# Patient Record
Sex: Male | Born: 1935 | Race: White | Hispanic: No | Marital: Married | Smoking: Former smoker
Health system: Southern US, Community
[De-identification: ages and names within clinical notes are randomized; demographics above are authoritative.]

## PROBLEM LIST (undated history)

## (undated) DIAGNOSIS — U071 COVID-19: Secondary | ICD-10-CM

## (undated) DIAGNOSIS — D62 Acute posthemorrhagic anemia: Secondary | ICD-10-CM

## (undated) DIAGNOSIS — I509 Heart failure, unspecified: Secondary | ICD-10-CM

## (undated) DIAGNOSIS — I350 Nonrheumatic aortic (valve) stenosis: Secondary | ICD-10-CM

## (undated) DIAGNOSIS — C801 Malignant (primary) neoplasm, unspecified: Secondary | ICD-10-CM

## (undated) DIAGNOSIS — C4490 Unspecified malignant neoplasm of skin, unspecified: Secondary | ICD-10-CM

## (undated) DIAGNOSIS — J942 Hemothorax: Secondary | ICD-10-CM

## (undated) DIAGNOSIS — E7800 Pure hypercholesterolemia, unspecified: Secondary | ICD-10-CM

## (undated) DIAGNOSIS — I10 Essential (primary) hypertension: Secondary | ICD-10-CM

## (undated) DIAGNOSIS — I209 Angina pectoris, unspecified: Secondary | ICD-10-CM

## (undated) DIAGNOSIS — Z952 Presence of prosthetic heart valve: Secondary | ICD-10-CM

## (undated) DIAGNOSIS — C61 Malignant neoplasm of prostate: Secondary | ICD-10-CM

## (undated) DIAGNOSIS — Z951 Presence of aortocoronary bypass graft: Secondary | ICD-10-CM

## (undated) DIAGNOSIS — Z9889 Other specified postprocedural states: Secondary | ICD-10-CM

## (undated) HISTORY — DX: Acute posthemorrhagic anemia: D62

## (undated) HISTORY — DX: Malignant (primary) neoplasm, unspecified: C80.1

## (undated) HISTORY — DX: Essential (primary) hypertension: I10

## (undated) HISTORY — DX: Unspecified malignant neoplasm of skin, unspecified: C44.90

## (undated) HISTORY — PX: CORONARY ARTERY BYPASS GRAFT: SHX141

## (undated) HISTORY — DX: Heart failure, unspecified: I50.9

## (undated) HISTORY — DX: Malignant neoplasm of prostate: C61

## (undated) HISTORY — PX: HERNIA REPAIR: SHX51

## (undated) HISTORY — DX: Other specified postprocedural states: Z98.890

## (undated) HISTORY — PX: AORTIC VALVE REPLACEMENT: SHX41

## (undated) HISTORY — DX: Hemothorax: J94.2

## (undated) HISTORY — DX: Presence of prosthetic heart valve: Z95.2

## (undated) HISTORY — DX: Nonrheumatic aortic (valve) stenosis: I35.0

## (undated) HISTORY — DX: Presence of aortocoronary bypass graft: Z95.1

## (undated) HISTORY — DX: Pure hypercholesterolemia, unspecified: E78.00

## (undated) HISTORY — DX: Angina pectoris, unspecified: I20.9

## (undated) HISTORY — PX: CARDIAC SURGERY: SHX584

---

## 1999-08-09 HISTORY — PX: PROSTATECTOMY: SHX69

## 2004-05-27 ENCOUNTER — Inpatient Hospital Stay: Payer: Self-pay | Admitting: Cardiology

## 2004-05-27 ENCOUNTER — Other Ambulatory Visit: Payer: Self-pay

## 2004-09-20 ENCOUNTER — Emergency Department: Payer: Self-pay | Admitting: Emergency Medicine

## 2004-09-29 ENCOUNTER — Emergency Department: Payer: Self-pay | Admitting: Emergency Medicine

## 2005-11-17 ENCOUNTER — Ambulatory Visit: Payer: Self-pay | Admitting: Otolaryngology

## 2007-06-25 ENCOUNTER — Ambulatory Visit: Payer: Self-pay | Admitting: Urology

## 2007-07-19 ENCOUNTER — Emergency Department: Payer: Self-pay | Admitting: Emergency Medicine

## 2007-08-13 ENCOUNTER — Ambulatory Visit: Payer: Self-pay | Admitting: Urology

## 2007-09-13 ENCOUNTER — Ambulatory Visit: Payer: Self-pay | Admitting: Otolaryngology

## 2009-07-15 ENCOUNTER — Ambulatory Visit: Payer: Self-pay | Admitting: Internal Medicine

## 2010-07-12 ENCOUNTER — Ambulatory Visit: Payer: Self-pay | Admitting: Internal Medicine

## 2010-08-05 ENCOUNTER — Ambulatory Visit: Payer: Self-pay | Admitting: Physician Assistant

## 2010-08-06 ENCOUNTER — Ambulatory Visit: Payer: Self-pay | Admitting: Internal Medicine

## 2010-08-13 ENCOUNTER — Ambulatory Visit: Payer: Self-pay | Admitting: Internal Medicine

## 2010-08-30 ENCOUNTER — Ambulatory Visit: Payer: Self-pay | Admitting: Surgery

## 2010-09-06 ENCOUNTER — Ambulatory Visit: Payer: Self-pay | Admitting: Surgery

## 2010-09-07 LAB — PATHOLOGY REPORT

## 2011-07-05 ENCOUNTER — Ambulatory Visit: Payer: Self-pay | Admitting: Internal Medicine

## 2013-01-10 ENCOUNTER — Ambulatory Visit: Payer: Self-pay | Admitting: Cardiology

## 2013-01-17 DIAGNOSIS — I2510 Atherosclerotic heart disease of native coronary artery without angina pectoris: Secondary | ICD-10-CM | POA: Insufficient documentation

## 2013-01-17 DIAGNOSIS — I350 Nonrheumatic aortic (valve) stenosis: Secondary | ICD-10-CM

## 2013-01-17 HISTORY — DX: Nonrheumatic aortic (valve) stenosis: I35.0

## 2013-01-22 DIAGNOSIS — I209 Angina pectoris, unspecified: Secondary | ICD-10-CM | POA: Insufficient documentation

## 2013-01-22 DIAGNOSIS — I509 Heart failure, unspecified: Secondary | ICD-10-CM

## 2013-01-22 HISTORY — DX: Heart failure, unspecified: I50.9

## 2013-01-22 HISTORY — DX: Angina pectoris, unspecified: I20.9

## 2013-01-25 DIAGNOSIS — D62 Acute posthemorrhagic anemia: Secondary | ICD-10-CM

## 2013-01-25 DIAGNOSIS — Z951 Presence of aortocoronary bypass graft: Secondary | ICD-10-CM | POA: Insufficient documentation

## 2013-01-25 DIAGNOSIS — J942 Hemothorax: Secondary | ICD-10-CM

## 2013-01-25 DIAGNOSIS — Z9889 Other specified postprocedural states: Secondary | ICD-10-CM

## 2013-01-25 DIAGNOSIS — J90 Pleural effusion, not elsewhere classified: Secondary | ICD-10-CM

## 2013-01-25 DIAGNOSIS — G8918 Other acute postprocedural pain: Secondary | ICD-10-CM | POA: Insufficient documentation

## 2013-01-25 DIAGNOSIS — Z952 Presence of prosthetic heart valve: Secondary | ICD-10-CM | POA: Insufficient documentation

## 2013-01-25 HISTORY — DX: Presence of aortocoronary bypass graft: Z95.1

## 2013-01-25 HISTORY — DX: Other specified postprocedural states: Z98.890

## 2013-01-25 HISTORY — DX: Acute posthemorrhagic anemia: D62

## 2013-01-25 HISTORY — DX: Pleural effusion, not elsewhere classified: J90

## 2013-01-25 HISTORY — DX: Presence of prosthetic heart valve: Z95.2

## 2013-01-25 HISTORY — DX: Hemothorax: J94.2

## 2013-03-07 ENCOUNTER — Encounter: Payer: Self-pay | Admitting: Cardiology

## 2013-03-08 ENCOUNTER — Encounter: Payer: Self-pay | Admitting: Cardiology

## 2013-03-15 ENCOUNTER — Ambulatory Visit: Payer: Self-pay | Admitting: Internal Medicine

## 2013-04-08 ENCOUNTER — Encounter: Payer: Self-pay | Admitting: Cardiology

## 2014-06-02 DIAGNOSIS — I10 Essential (primary) hypertension: Secondary | ICD-10-CM | POA: Insufficient documentation

## 2014-06-02 HISTORY — DX: Essential (primary) hypertension: I10

## 2014-10-15 DIAGNOSIS — E7800 Pure hypercholesterolemia, unspecified: Secondary | ICD-10-CM

## 2014-10-15 HISTORY — DX: Pure hypercholesterolemia, unspecified: E78.00

## 2015-04-28 DIAGNOSIS — C801 Malignant (primary) neoplasm, unspecified: Secondary | ICD-10-CM

## 2015-04-28 HISTORY — DX: Malignant (primary) neoplasm, unspecified: C80.1

## 2015-09-14 DIAGNOSIS — Z0389 Encounter for observation for other suspected diseases and conditions ruled out: Secondary | ICD-10-CM | POA: Diagnosis not present

## 2015-09-16 ENCOUNTER — Emergency Department: Payer: PPO

## 2015-09-16 ENCOUNTER — Emergency Department
Admission: EM | Admit: 2015-09-16 | Discharge: 2015-09-16 | Disposition: A | Discharge status: Home / Self Care | Payer: PPO | Attending: Emergency Medicine | Admitting: Emergency Medicine

## 2015-09-16 ENCOUNTER — Encounter: Payer: Self-pay | Admitting: *Deleted

## 2015-09-16 DIAGNOSIS — N201 Calculus of ureter: Secondary | ICD-10-CM | POA: Insufficient documentation

## 2015-09-16 DIAGNOSIS — N132 Hydronephrosis with renal and ureteral calculous obstruction: Secondary | ICD-10-CM | POA: Diagnosis not present

## 2015-09-16 DIAGNOSIS — R109 Unspecified abdominal pain: Secondary | ICD-10-CM | POA: Diagnosis not present

## 2015-09-16 DIAGNOSIS — R1031 Right lower quadrant pain: Secondary | ICD-10-CM | POA: Diagnosis not present

## 2015-09-16 LAB — CBC
HEMATOCRIT: 48.1 % (ref 40.0–52.0)
HEMOGLOBIN: 16.1 g/dL (ref 13.0–18.0)
MCH: 31.4 pg (ref 26.0–34.0)
MCHC: 33.5 g/dL (ref 32.0–36.0)
MCV: 93.8 fL (ref 80.0–100.0)
PLATELETS: 163 10*3/uL (ref 150–440)
RBC: 5.13 MIL/uL (ref 4.40–5.90)
RDW: 13.6 % (ref 11.5–14.5)
WBC: 10.1 10*3/uL (ref 3.8–10.6)

## 2015-09-16 LAB — URINALYSIS COMPLETE WITH MICROSCOPIC (ARMC ONLY)
BILIRUBIN URINE: NEGATIVE
Bacteria, UA: NONE SEEN
GLUCOSE, UA: NEGATIVE mg/dL
HGB URINE DIPSTICK: NEGATIVE
LEUKOCYTES UA: NEGATIVE
Nitrite: NEGATIVE
PH: 5 (ref 5.0–8.0)
PROTEIN: 100 mg/dL — AB
SQUAMOUS EPITHELIAL / LPF: NONE SEEN
Specific Gravity, Urine: 1.025 (ref 1.005–1.030)

## 2015-09-16 LAB — BASIC METABOLIC PANEL
ANION GAP: 10 (ref 5–15)
BUN: 28 mg/dL — ABNORMAL HIGH (ref 6–20)
CO2: 23 mmol/L (ref 22–32)
CREATININE: 1.22 mg/dL (ref 0.61–1.24)
Calcium: 10.1 mg/dL (ref 8.9–10.3)
Chloride: 105 mmol/L (ref 101–111)
GFR calc non Af Amer: 54 mL/min — ABNORMAL LOW (ref >60)
Glucose, Bld: 129 mg/dL — ABNORMAL HIGH (ref 65–99)
POTASSIUM: 4.6 mmol/L (ref 3.5–5.1)
SODIUM: 138 mmol/L (ref 135–145)

## 2015-09-16 MED ORDER — ONDANSETRON 4 MG PO TBDP: 4.0000 mg | ORAL_TABLET | Freq: Three times a day (TID) | ORAL | Status: DC | PRN

## 2015-09-16 MED ORDER — OXYCODONE-ACETAMINOPHEN 5-325 MG PO TABS: 1.0000 | ORAL_TABLET | Freq: Four times a day (QID) | ORAL | Status: DC | PRN

## 2015-09-16 MED ORDER — MORPHINE SULFATE (PF) 4 MG/ML IV SOLN
4.0000 mg | Freq: Once | INTRAVENOUS | Status: AC
  Administered 2015-09-16: 4 mg via INTRAVENOUS
  Filled 2015-09-16: qty 1

## 2015-09-16 MED ORDER — ONDANSETRON HCL 4 MG/2ML IJ SOLN
4.0000 mg | Freq: Once | INTRAMUSCULAR | Status: AC
  Administered 2015-09-16: 4 mg via INTRAVENOUS
  Filled 2015-09-16: qty 2

## 2015-09-16 MED ORDER — TAMSULOSIN HCL 0.4 MG PO CAPS: 0.4000 mg | ORAL_CAPSULE | Freq: Every day | ORAL | Status: DC

## 2015-09-16 MED ORDER — OXYCODONE-ACETAMINOPHEN 5-325 MG PO TABS
2.0000 | ORAL_TABLET | Freq: Once | ORAL | Status: AC
  Administered 2015-09-16: 2 via ORAL
  Filled 2015-09-16: qty 2

## 2015-09-16 MED ORDER — SODIUM CHLORIDE 0.9 % IV BOLUS (SEPSIS)
1000.0000 mL | Freq: Once | INTRAVENOUS | Status: AC
  Administered 2015-09-16: 1000 mL via INTRAVENOUS

## 2015-09-16 NOTE — ED Provider Notes (Signed)
[facility] Emergency Department Provider Note  Time seen: 2:44 PM  I have reviewed the triage vital signs and the nursing notes.   HISTORY  Chief Complaint Flank Pain    HPI Bradley Nelson is a 82 y.o. male with a past medical history of kidney stones, presents to the emergency department with right flank pain. According to the patient beginning at 10 AM this morning he began experiencing sharp significant right flank pain which started in his back and has now radiated to the front of his abdomen. States it feels similar to prior kidney stones. Patient has had 2 kidney stones previously. Denies any dysuria or hematuria. Denies any diarrhea, black or bloody stool. Some nausea but denies vomiting. Describes his pain as a 7/10, sharp pain in the right lower quadrant. Patient states he is status post appendectomy.     Past Medical History  Diagnosis Date  . Cancer ([place])     There are no active problems to display for this patient.   Past Surgical History  Procedure Laterality Date  . Coronary artery bypass graft    . Aortic valve replacement      No current outpatient prescriptions on file.  Allergies Review of patient's allergies indicates no known allergies.  History reviewed. No pertinent family history.  Social History Social History  Substance Use Topics  . Smoking status: Unknown If Ever Smoked  . Smokeless tobacco: None  . Alcohol Use: None    Review of Systems Constitutional: Negative for fever. Cardiovascular: Negative for chest pain. Respiratory: Negative for shortness of breath. Gastrointestinal: Right flank pain. Positive nausea. Negative for vomiting or diarrhea. Genitourinary: Negative for dysuria. Negative hematuria. Musculoskeletal: Right back pain. Neurological: Negative for headache 10-point ROS otherwise negative.  ____________________________________________   PHYSICAL EXAM:  VITAL SIGNS: ED Triage Vitals   Enc Vitals Group     BP 09/16/15 1302 217/107 mmHg     Pulse Rate 09/16/15 1302 53     Resp 09/16/15 1302 18     Temp 09/16/15 1302 97.5 F (36.4 C)     Temp Source 09/16/15 1302 Oral     SpO2 09/16/15 1302 98 %     Weight 09/16/15 1302 165 lb (74.844 kg)     Height 09/16/15 1302 5\' 7"  (1.702 m)     Head Cir --      Peak Flow --      Pain Score --      Pain Loc --      Pain Edu? --      Excl. in [place]? --     Constitutional: Alert and oriented. Well appearing and in no distress. Eyes: Normal exam ENT   Head: Normocephalic and atraumatic.   Mouth/Throat: Mucous membranes are moist. Cardiovascular: Normal rate, regular rhythm. No murmur Respiratory: Normal respiratory effort without tachypnea nor retractions. Breath sounds are clear Gastrointestinal: Soft, mild right lower quadrant tenderness palpation. No rebound or guarding. No distention. Musculoskeletal: Nontender with normal range of motion in all extremities. Neurologic:  Normal speech and language. No gross focal neurologic deficits  Skin:  Skin is warm, dry and intact.  Psychiatric: Mood and affect are normal. Speech and behavior are normal.   ____________________________________________    RADIOLOGY  CT shows distal right ureteral stone.  ____________________________________________   INITIAL IMPRESSION / ASSESSMENT AND PLAN / ED COURSE  Pertinent labs & imaging results that were available during my care of the patient were reviewed by me and considered in my  medical decision making (see chart for details).  Patient presents with right flank pain which she states is consistent with prior kidney stones. Patient states his last kidney stone was brought 20 one year ago. Denies any dysuria or hematuria. Describes the pain as moderate 7/10 sharp pain. Labs are largely within normal limits. Urinalysis is negative. We will obtain a CT scan to rule out ureteral lithiasis.  ED consistent with 3 mm distal ureteral  stone. Labs otherwise within normal limits. We'll discharge on Percocet, Flomax, with urology follow-up if needed. Patient agreeable.  ____________________________________________   FINAL CLINICAL IMPRESSION(S) / ED DIAGNOSES  Right flank pain Ureterolithiasis    Harvest Dark, MD 09/16/15 [phone]

## 2015-09-16 NOTE — Discharge Instructions (Signed)
Kidney Stones °Kidney stones (urolithiasis) are deposits that form inside your kidneys. The intense pain is caused by the stone moving through the urinary tract. When the stone moves, the ureter goes into spasm around the stone. The stone is usually passed in the urine.  °CAUSES  °· A disorder that makes certain neck glands produce too much parathyroid hormone (primary hyperparathyroidism). °· A buildup of uric acid crystals, similar to gout in your joints. °· Narrowing (stricture) of the ureter. °· A kidney obstruction present at birth (congenital obstruction). °· Previous surgery on the kidney or ureters. °· Numerous kidney infections. °SYMPTOMS  °· Feeling sick to your stomach (nauseous). °· Throwing up (vomiting). °· Blood in the urine (hematuria). °· Pain that usually spreads (radiates) to the groin. °· Frequency or urgency of urination. °DIAGNOSIS  °· Taking a history and physical exam. °· Blood or urine tests. °· CT scan. °· Occasionally, an examination of the inside of the urinary bladder (cystoscopy) is performed. °TREATMENT  °· Observation. °· Increasing your fluid intake. °· Extracorporeal shock wave lithotripsy--This is a noninvasive procedure that uses shock waves to break up kidney stones. °· Surgery may be needed if you have severe pain or persistent obstruction. There are various surgical procedures. Most of the procedures are performed with the use of small instruments. Only small incisions are needed to accommodate these instruments, so recovery time is minimized. °The size, location, and chemical composition are all important variables that will determine the proper choice of action for you. Talk to your health care provider to better understand your situation so that you will minimize the risk of injury to yourself and your kidney.  °HOME CARE INSTRUCTIONS  °· Drink enough water and fluids to keep your urine clear or pale yellow. This will help you to pass the stone or stone fragments. °· Strain  all urine through the provided strainer. Keep all particulate matter and stones for your health care provider to see. The stone causing the pain may be as small as a grain of salt. It is very important to use the strainer each and every time you pass your urine. The collection of your stone will allow your health care provider to analyze it and verify that a stone has actually passed. The stone analysis will often identify what you can do to reduce the incidence of recurrences. °· Only take over-the-counter or prescription medicines for pain, discomfort, or fever as directed by your health care provider. °· Keep all follow-up visits as told by your health care provider. This is important. °· Get follow-up X-rays if required. The absence of pain does not always mean that the stone has passed. It may have only stopped moving. If the urine remains completely obstructed, it can cause loss of kidney function or even complete destruction of the kidney. It is your responsibility to make sure X-rays and follow-ups are completed. Ultrasounds of the kidney can show blockages and the status of the kidney. Ultrasounds are not associated with any radiation and can be performed easily in a matter of minutes. °· Make changes to your daily diet as told by your health care provider. You may be told to: °¨ Limit the amount of salt that you eat. °¨ Eat 5 or more servings of fruits and vegetables each day. °¨ Limit the amount of meat, poultry, fish, and eggs that you eat. °· Collect a 24-hour urine sample as told by your health care provider. You may need to collect another urine sample every 6-12   months. °SEEK MEDICAL CARE IF: °· You experience pain that is progressive and unresponsive to any pain medicine you have been prescribed. °SEEK IMMEDIATE MEDICAL CARE IF:  °· Pain cannot be controlled with the prescribed medicine. °· You have a fever or shaking chills. °· The severity or intensity of pain increases over 18 hours and is not  relieved by pain medicine. °· You develop a new onset of abdominal pain. °· You feel faint or pass out. °· You are unable to urinate. °  °This information is not intended to replace advice given to you by your health care provider. Make sure you discuss any questions you have with your health care provider. °  °Document Released: 07/25/2005 Document Revised: 04/15/2015 Document Reviewed: 12/26/2012 °Elsevier Interactive Patient Education ©2016 Else[employer]. ° °

## 2015-09-16 NOTE — ED Notes (Signed)
States right flank pain that began this AM, denies any blood in urine, states hx of kidney stones

## 2015-10-22 DIAGNOSIS — D696 Thrombocytopenia, unspecified: Secondary | ICD-10-CM | POA: Diagnosis not present

## 2015-10-27 DIAGNOSIS — D696 Thrombocytopenia, unspecified: Secondary | ICD-10-CM | POA: Diagnosis not present

## 2015-10-27 DIAGNOSIS — E7800 Pure hypercholesterolemia, unspecified: Secondary | ICD-10-CM | POA: Diagnosis not present

## 2015-10-27 DIAGNOSIS — I2510 Atherosclerotic heart disease of native coronary artery without angina pectoris: Secondary | ICD-10-CM | POA: Diagnosis not present

## 2015-10-27 DIAGNOSIS — I10 Essential (primary) hypertension: Secondary | ICD-10-CM | POA: Diagnosis not present

## 2016-01-15 ENCOUNTER — Emergency Department
Admission: EM | Admit: 2016-01-15 | Discharge: 2016-01-16 | Disposition: A | Discharge status: Home / Self Care | Payer: PPO | Attending: Emergency Medicine | Admitting: Emergency Medicine

## 2016-01-15 ENCOUNTER — Encounter: Payer: Self-pay | Admitting: *Deleted

## 2016-01-15 DIAGNOSIS — S0990XA Unspecified injury of head, initial encounter: Secondary | ICD-10-CM | POA: Diagnosis not present

## 2016-01-15 DIAGNOSIS — Y999 Unspecified external cause status: Secondary | ICD-10-CM | POA: Insufficient documentation

## 2016-01-15 DIAGNOSIS — Z87891 Personal history of nicotine dependence: Secondary | ICD-10-CM | POA: Insufficient documentation

## 2016-01-15 DIAGNOSIS — W1789XA Other fall from one level to another, initial encounter: Secondary | ICD-10-CM | POA: Diagnosis not present

## 2016-01-15 DIAGNOSIS — Y9389 Activity, other specified: Secondary | ICD-10-CM | POA: Diagnosis not present

## 2016-01-15 DIAGNOSIS — Z951 Presence of aortocoronary bypass graft: Secondary | ICD-10-CM | POA: Insufficient documentation

## 2016-01-15 DIAGNOSIS — R079 Chest pain, unspecified: Secondary | ICD-10-CM | POA: Diagnosis not present

## 2016-01-15 DIAGNOSIS — Y9289 Other specified places as the place of occurrence of the external cause: Secondary | ICD-10-CM | POA: Diagnosis not present

## 2016-01-15 DIAGNOSIS — Z79899 Other long term (current) drug therapy: Secondary | ICD-10-CM | POA: Diagnosis not present

## 2016-01-15 DIAGNOSIS — Z7982 Long term (current) use of aspirin: Secondary | ICD-10-CM | POA: Insufficient documentation

## 2016-01-15 DIAGNOSIS — S20212A Contusion of left front wall of thorax, initial encounter: Secondary | ICD-10-CM | POA: Diagnosis not present

## 2016-01-15 DIAGNOSIS — Z859 Personal history of malignant neoplasm, unspecified: Secondary | ICD-10-CM | POA: Diagnosis not present

## 2016-01-15 DIAGNOSIS — S299XXA Unspecified injury of thorax, initial encounter: Secondary | ICD-10-CM | POA: Diagnosis not present

## 2016-01-15 NOTE — ED Notes (Signed)
Pt fell from 3 ft height onto back, landing in yard on grass and dirt. Pt was knocked off porch by a dog. Pt denies LoC. Pt c/o L shoulder and back pain as well as L chest. 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 Pt has taken no meds for pain at this time. Pt has not taken evening meds tonight.

## 2016-01-16 ENCOUNTER — Emergency Department: Payer: PPO

## 2016-01-16 DIAGNOSIS — S0990XA Unspecified injury of head, initial encounter: Secondary | ICD-10-CM | POA: Diagnosis not present

## 2016-01-16 DIAGNOSIS — R079 Chest pain, unspecified: Secondary | ICD-10-CM | POA: Diagnosis not present

## 2016-01-16 DIAGNOSIS — S299XXA Unspecified injury of thorax, initial encounter: Secondary | ICD-10-CM | POA: Diagnosis not present

## 2016-01-16 MED ORDER — OXYCODONE-ACETAMINOPHEN 5-325 MG PO TABS: 1.0000 | ORAL_TABLET | ORAL | Status: DC | PRN

## 2016-01-16 MED ORDER — OXYCODONE-ACETAMINOPHEN 5-325 MG PO TABS
1.0000 | ORAL_TABLET | Freq: Once | ORAL | Status: AC
  Administered 2016-01-16: 1 via ORAL

## 2016-01-16 MED ORDER — OXYCODONE-ACETAMINOPHEN 5-325 MG PO TABS
ORAL_TABLET | ORAL | Status: AC
  Filled 2016-01-16: qty 1

## 2016-01-16 NOTE — Discharge Instructions (Signed)
Chest Contusion A chest contusion is a deep bruise on your chest area. Contusions are the result of an injury that caused bleeding under the skin. A chest contusion may involve bruising of the skin, muscles, or ribs. The contusion may turn blue, purple, or yellow. Minor injuries will give you a painless contusion, but more severe contusions may stay painful and swollen for a few weeks. CAUSES  A contusion is usually caused by a blow, trauma, or direct force to an area of the body. SYMPTOMS   Swelling and redness of the injured area.  Discoloration of the injured area.  Tenderness and soreness of the injured area.  Pain. DIAGNOSIS  The diagnosis can be made by taking a history and performing a physical exam. An X-ray, CT scan, or MRI may be needed to determine if there were any associated injuries, such as broken bones (fractures) or internal injuries. TREATMENT  Often, the best treatment for a chest contusion is resting, icing, and applying cold compresses to the injured area. Deep breathing exercises may be recommended to reduce the risk of pneumonia. Over-the-counter medicines may also be recommended for pain control. HOME CARE INSTRUCTIONS   Put ice on the injured area.  Put ice in a plastic bag.  Place a towel between your skin and the bag.  Leave the ice on for 15-20 minutes, 03-04 times a day.  Only take over-the-counter or prescription medicines as directed by your caregiver. Your caregiver may recommend avoiding anti-inflammatory medicines (aspirin, ibuprofen, and naproxen) for 48 hours because these medicines may increase bruising.  Rest the injured area.  Perform deep-breathing exercises as directed by your caregiver.  Stop smoking if you smoke.  Do not lift objects over 5 pounds (2.3 kg) for 3 days or longer if recommended by your caregiver. SEEK IMMEDIATE MEDICAL CARE IF:   You have increased bruising or swelling.  You have pain that is getting worse.  You have  difficulty breathing.  You have dizziness, weakness, or fainting.  You have blood in your urine or stool.  You cough up or vomit blood.  Your swelling or pain is not relieved with medicines. MAKE SURE YOU:   Understand these instructions.  Will watch your condition.  Will get help right away if you are not doing well or get worse.   This information is not intended to replace advice given to you by your health care provider. Make sure you discuss any questions you have with your health care provider.   Document Released: 04/19/2001 Document Revised: 04/18/2012 Document Reviewed: 01/16/2012 Elsevier Interactive Patient Education 2016 Elsevier Inc.  

## 2016-01-16 NOTE — ED Provider Notes (Signed)
[facility] Emergency Department Provider Note  ____________________________________________  Time seen: 12:30 AM  I have reviewed the triage vital signs and the nursing notes.   HISTORY  Chief Complaint Fall       Bradley Nelson is a 82 y.o. male presents status post 3 foot fall onto the back with occipital head injury. Patient states he was knocked off his porch by his brother's dog. Patient admits to 8 out of 10 left shoulder/posterior left upper back pain. Patient denies any dyspnea however does state that pain is worse with deep inspiration. Patient denies any loss of consciousness but admits to being "dazed after the event.   Past Medical History  Diagnosis Date  . Cancer ([place])     There are no active problems to display for this patient.   Past Surgical History  Procedure Laterality Date  . Coronary artery bypass graft    . Aortic valve replacement    . Cardiac surgery      Current Outpatient Rx  Name  Route  Sig  Dispense  Refill  . ondansetron (ZOFRAN ODT) 4 MG disintegrating tablet   Oral   Take 1 tablet (4 mg total) by mouth every 8 (eight) hours as needed for nausea or vomiting.   20 tablet   0   . oxyCODONE-acetaminophen (ROXICET) 5-325 MG tablet   Oral   Take 1 tablet by mouth every 6 (six) hours as needed.   20 tablet   0   . tamsulosin (FLOMAX) 0.4 MG CAPS capsule   Oral   Take 1 capsule (0.4 mg total) by mouth daily.   30 capsule   0     Allergies No known drug allergies History reviewed. No pertinent family history.  Social History Social History  Substance Use Topics  . Smoking status: Former Research scientist (life sciences)  . Smokeless tobacco: Never Used  . Alcohol Use: No    Review of Systems  Constitutional: Negative for fever. Eyes: Negative for visual changes. ENT: Negative for sore throat. Cardiovascular: Positive for chest pain. Respiratory: Negative for shortness of breath. Gastrointestinal: Negative for  abdominal pain, vomiting and diarrhea. Genitourinary: Negative for dysuria. Musculoskeletal: Positive for back pain. Skin: Negative for rash. Neurological: Negative for headaches, focal weakness or numbness.   10-point ROS otherwise negative.  ____________________________________________   PHYSICAL EXAM:  VITAL SIGNS: ED Triage Vitals  Enc Vitals Group     BP 01/15/16 2144 165/77 mmHg     Pulse Rate 01/15/16 2144 76     Resp 01/15/16 2144 20     Temp 01/15/16 2144 98.1 F (36.7 C)     Temp Source 01/15/16 2144 Oral     SpO2 01/15/16 2144 97 %     Weight 01/15/16 2144 157 lb (71.215 kg)     Height 01/15/16 2144 5\' 7"  (1.702 m)     Head Cir --      Peak Flow --      Pain Score 01/15/16 2138 6     Pain Loc --      Pain Edu? --      Excl. in [place]? --      Constitutional: Alert and oriented. Well appearing and in no distress. Eyes: Conjunctivae are normal. PERRL. Normal extraocular movements. ENT   Head: Normocephalic and atraumatic.   Nose: No congestion/rhinnorhea.   Mouth/Throat: Mucous membranes are moist.   Neck: No stridor. Hematological/Lymphatic/Immunilogical: No cervical lymphadenopathy. Cardiovascular: Normal rate, regular rhythm. Normal and symmetric distal pulses are present  in all extremities. No murmurs, rubs, or gallops. Respiratory: Normal respiratory effort without tachypnea nor retractions. Breath sounds are clear and equal bilaterally. No wheezes/rales/rhonchi. Gastrointestinal: Soft and nontender. No distention. There is no CVA tenderness. Genitourinary: deferred Musculoskeletal: Nontender with normal range of motion in all extremities. No joint effusions.  No lower extremity tenderness nor edema. Neurologic:  Normal speech and language. No gross focal neurologic deficits are appreciated. Speech is normal.  Skin:  Skin is warm, dry and intact. No rash noted. Psychiatric: Mood and affect are normal. Speech and behavior are normal. Patient  exhibits appropriate insight and judgment.    EKG  ED ECG REPORT I, [place] N BROWN, the attending physician, personally viewed and interpreted this ECG.   Date: 01/16/2016  EKG Time: 9:41 PM  Rate: 69  Rhythm: Normal sinus rhythm left ventricular hypertrophy  Axis: Normal  Intervals:  ST&T Change: None   ____________________________________________    RADIOLOGY CT Head Wo Contrast (Final result) Result time: 01/16/16 01:02:10   Final result by Rad Results In Interface (01/16/16 01:02:10)   Narrative:   CLINICAL DATA: Status post fall 3 feet, with concern for head injury. Initial encounter.  EXAM: CT HEAD WITHOUT CONTRAST  TECHNIQUE: Contiguous axial images were obtained from the base of the skull through the vertex without intravenous contrast.  COMPARISON: CT of the head performed 07/05/2011  FINDINGS: There is no evidence of acute infarction, mass lesion, or intra- or extra-axial hemorrhage on CT.  Prominence of the ventricles and sulci reflects mild to moderate cortical volume loss. Mild cerebellar atrophy is noted. Scattered periventricular and subcortical white matter change likely reflects small vessel ischemic microangiopathy.  The brainstem and fourth ventricle are within normal limits. The basal ganglia are unremarkable in appearance. The cerebral hemispheres demonstrate grossly normal gray-white differentiation. No mass effect or midline shift is seen.  There is no evidence of fracture; visualized osseous structures are unremarkable in appearance. The visualized portions of the orbits are within normal limits. Mucosal thickening is noted at the left maxillary sinus. The remaining paranasal sinuses and mastoid air cells are well-aerated. No significant soft tissue abnormalities are seen.  IMPRESSION: 1. No evidence of traumatic intracranial injury or fracture. 2. Mild to moderate cortical volume loss and scattered small vessel ischemic  microangiopathy. 3. Mucosal thickening at the left maxillary sinus.   Electronically Signed By: Garald Balding M.D. On: 01/16/2016 01:02          DG Chest 2 View (Final result) Result time: 01/16/16 00:36:48   Final result by Rad Results In Interface (01/16/16 00:36:48)   Narrative:   CLINICAL DATA: Status post fall 3 feet onto back, with left shoulder, back and chest pain. Initial encounter.  EXAM: CHEST 2 VIEW  COMPARISON: None.  FINDINGS: The lungs are well-aerated. There is mild elevation of the right hemidiaphragm. There is no evidence of focal opacification, pleural effusion or pneumothorax.  The heart is normal in size; the patient is status post median sternotomy, with evidence of prior CABG. An aortic valve replacement is noted. No acute osseous abnormalities are seen. Clips are noted within the right upper quadrant, reflecting prior cholecystectomy.  IMPRESSION: Mild elevation of the right hemidiaphragm. Lungs remain grossly clear.   Electronically Signed By: Garald Balding M.D. On: 01/16/2016 00:36         INITIAL IMPRESSION / ASSESSMENT AND PLAN / ED COURSE  Pertinent labs & imaging results that were available during my care of the patient were reviewed by me and considered  in my medical decision making (see chart for details).  CT scan of the head revealed no no evidence of acute intracranial injury. Given pain with palpation of rib #7 concern for possible rib fracture. Patient will be prescribed Percocet for home  ____________________________________________   FINAL CLINICAL IMPRESSION(S) / ED DIAGNOSES  Final diagnoses:  Rib contusion, left, initial encounter      Gregor Hams, MD 01/16/16 [phone]

## 2016-01-18 DIAGNOSIS — I2510 Atherosclerotic heart disease of native coronary artery without angina pectoris: Secondary | ICD-10-CM | POA: Diagnosis not present

## 2016-01-18 DIAGNOSIS — Z9889 Other specified postprocedural states: Secondary | ICD-10-CM | POA: Diagnosis not present

## 2016-01-18 DIAGNOSIS — I10 Essential (primary) hypertension: Secondary | ICD-10-CM | POA: Diagnosis not present

## 2016-01-18 DIAGNOSIS — I350 Nonrheumatic aortic (valve) stenosis: Secondary | ICD-10-CM | POA: Diagnosis not present

## 2016-01-18 DIAGNOSIS — I5022 Chronic systolic (congestive) heart failure: Secondary | ICD-10-CM | POA: Diagnosis not present

## 2016-01-18 DIAGNOSIS — Z953 Presence of xenogenic heart valve: Secondary | ICD-10-CM | POA: Diagnosis not present

## 2016-01-18 DIAGNOSIS — E7800 Pure hypercholesterolemia, unspecified: Secondary | ICD-10-CM | POA: Diagnosis not present

## 2016-01-18 DIAGNOSIS — Z951 Presence of aortocoronary bypass graft: Secondary | ICD-10-CM | POA: Diagnosis not present

## 2016-01-21 ENCOUNTER — Encounter: Payer: Self-pay | Admitting: Urology

## 2016-01-21 ENCOUNTER — Telehealth: Payer: Self-pay | Admitting: Urology

## 2016-01-21 ENCOUNTER — Ambulatory Visit (INDEPENDENT_AMBULATORY_CARE_PROVIDER_SITE_OTHER): Payer: PPO | Admitting: Urology

## 2016-01-21 VITALS — BP 135/90 | HR 78 | Ht 67.0 in | Wt 162.0 lb

## 2016-01-21 DIAGNOSIS — R32 Unspecified urinary incontinence: Secondary | ICD-10-CM | POA: Diagnosis not present

## 2016-01-21 DIAGNOSIS — R109 Unspecified abdominal pain: Secondary | ICD-10-CM

## 2016-01-21 DIAGNOSIS — N393 Stress incontinence (female) (male): Secondary | ICD-10-CM | POA: Diagnosis not present

## 2016-01-21 DIAGNOSIS — Z8546 Personal history of malignant neoplasm of prostate: Secondary | ICD-10-CM | POA: Diagnosis not present

## 2016-01-21 LAB — URINALYSIS, COMPLETE
BILIRUBIN UA: NEGATIVE
Glucose, UA: NEGATIVE
KETONES UA: NEGATIVE
LEUKOCYTES UA: NEGATIVE
Nitrite, UA: NEGATIVE
Protein, UA: NEGATIVE
RBC UA: NEGATIVE
UUROB: 0.2 mg/dL (ref 0.2–1.0)
pH, UA: 5 (ref 5.0–7.5)

## 2016-01-21 LAB — MICROSCOPIC EXAMINATION
Bacteria, UA: NONE SEEN
WBC, UA: NONE SEEN /hpf (ref 0–?)

## 2016-01-21 LAB — BLADDER SCAN AMB NON-IMAGING

## 2016-01-21 NOTE — Telephone Encounter (Signed)
Please facilitate obtaining Bard Cunningham clamp for this patient, size regular.  Hollice Espy, MD

## 2016-01-21 NOTE — Progress Notes (Signed)
01/21/2016 2:58 PM   Bradley Nelson 03-11-1932 [id number]  Referring provider: [name], MD [name] [place], [place] [address]  Chief Complaint  Patient presents with  . Urinary Incontinence    New Patient    HPI: 82 yo with history of prostate cancer s/p open radical prostatectomy in 2001 by Bradley. Boneta Nelson who presents today to discuss ongoing SUI.   Over the past few years, he is leaking more significantly, 2 ppd which are saturated.  He does not leak at night.  He has also developed penile irrigation from the wetness.   ~10 years ago, he underwent injection with collagen injection which did not help.  He has also done Kegels which have helped somewhat.    No issues with urinary tract infections.    He does have severe baseline ED.   Bradley Nelson fell last week after being knocked over my a dog. He did hit his head.  He has bruises on his hand.  He also has left flank soreness.  He was seen in the ED with negative head CT and CXR.  No gross hematuria or changes in urination. UA today negative for blood.   Review of medication indicate that he has been prescribed Flomax.  He reports that he has not taken this medication in quite some time.  No recent PSA.  Original pathology unknown. No history of ADT or XRT.  PMH: Past Medical History  Diagnosis Date  . Prostate cancer ([place])   . Heart failure ([place]) 01/22/2013  . Essential (primary) hypertension 06/02/2014  . Aortic heart valve narrowing 01/17/2013  . Angina, class II ([place]) 01/22/2013  . Bloody pleural effusion 01/25/2013  . Malignant neoplastic disease ([place]) 04/28/2015    Overview:  prostate cancer, s/p prostatectomy    . Hypercholesterolemia 10/15/2014  . H/O coronary artery bypass surgery 01/25/2013    Overview:  cabg x3 with LIMA to LAD, SVG to PDA, SVG to left circumflex with septal muomectomy and aortic valve replacement with Ce Magna bioprosthetic valve on 01-24-13 by Bradley Nelson at  [facility].   . History of open heart surgery 01/25/2013  . H/O aortic valve replacement 01/25/2013  . Acute blood loss anemia 01/25/2013  . Skin cancer     Surgical History: Past Surgical History  Procedure Laterality Date  . Coronary artery bypass graft    . Aortic valve replacement    . Cardiac surgery    . Prostatectomy  2001    Bradley. [name]  . Hernia repair      Home Medications:    Medication List       This list is accurate as of: 01/21/16  2:58 PM.  Always use your most recent med list.               Acidophilus 100 MG Caps  Take 1 capsule by mouth daily.     aspirin 81 MG chewable tablet  Chew 1 tablet by mouth daily.     CENTRUM SILVER tablet  Take 1 tablet by mouth daily.     Co Q-10 100 MG Caps  Take 100 mg by mouth daily.     metoprolol 50 MG tablet  Commonly known as:  LOPRESSOR  Take 1 tablet by mouth 2 (two) times daily.     oxyCODONE-acetaminophen 5-325 MG tablet  Commonly known as:  PERCOCET/ROXICET  Take 1 tablet by mouth every 4 (four) hours as needed for severe pain.  ranitidine 150 MG tablet  Commonly known as:  ZANTAC  Take 1 tablet by mouth 2 (two) times daily.     simvastatin 80 MG tablet  Commonly known as:  ZOCOR  Take 0.5 tablets by mouth every evening.        Allergies:  Allergies  Allergen Reactions  . Hydrocodone Other (See Comments)  . Oxycodone Other (See Comments)    Family History: Family History  Problem Relation Age of Onset  . Prostate cancer Father   . Kidney cancer Neg Hx   . Bladder Cancer Neg Hx     Social History:  reports that he has quit smoking. He has never used smokeless tobacco. He reports that he does not drink alcohol or use illicit drugs.  ROS: UROLOGY Frequent Urination?: Yes Hard to postpone urination?: Yes Burning/pain with urination?: No Get up at night to urinate?: Yes Leakage of urine?: Yes Urine stream starts and stops?: No Trouble starting stream?: No Do you have to strain to  urinate?: No Blood in urine?: No Urinary tract infection?: No Sexually transmitted disease?: No Injury to kidneys or bladder?: No Painful intercourse?: No Weak stream?: No Erection problems?: Yes Penile pain?: No  Gastrointestinal Nausea?: No Vomiting?: No Indigestion/heartburn?: No Diarrhea?: No Constipation?: No  Constitutional Fever: No Night sweats?: No Weight loss?: No Fatigue?: Yes  Skin Skin rash/lesions?: No Itching?: No  Eyes Blurred vision?: Yes Double vision?: Yes  Ears/Nose/Throat Sore throat?: No Sinus problems?: Yes  Hematologic/Lymphatic Swollen glands?: No Easy bruising?: Yes  Cardiovascular Leg swelling?: No Chest pain?: No  Respiratory Cough?: No Shortness of breath?: Yes  Endocrine Excessive thirst?: No  Musculoskeletal Back pain?: Yes Joint pain?: Yes  Neurological Headaches?: No Dizziness?: No  Psychologic Depression?: No Anxiety?: No  Physical Exam: BP 135/90 mmHg  Pulse 78  Ht 5\' 7"  (1.702 m)  Wt 162 lb (73.483 kg)  BMI 25.37 kg/m2  Constitutional:  Alert and oriented, No acute distress. HEENT: [place] AT, moist mucus membranes.  Trachea midline, no masses. Cardiovascular: No clubbing, cyanosis, or edema. Respiratory: Normal respiratory effort, no increased work of breathing. GI: Abdomen is soft, nontender, nondistended, no abdominal masses GU: No CVA tenderness. Questionably circumcised phallus with redundant foreskin, easily retractable. Adequate penile length. Bilateral descended testicles without masses. Skin: No rashes, bruises or suspicious lesions. Lymph: No cervical or inguinal adenopathy. Neurologic: Grossly intact, no focal deficits, moving all 4 extremities. Psychiatric: Normal mood and affect.  Laboratory Data: Lab Results  Component Value Date   WBC 10.1 09/16/2015   HGB 16.1 09/16/2015   HCT 48.1 09/16/2015   MCV 93.8 09/16/2015   PLT 163 09/16/2015    Lab Results  Component Value Date    CREATININE 1.22 09/16/2015    Urinalysis Results for orders placed or performed in visit on 01/21/16  Microscopic Examination  Result Value Ref Range   WBC, UA None seen 0 -  5 /hpf   RBC, UA 0-2 0 -  2 /hpf   Epithelial Cells (non renal) 0-10 0 - 10 /hpf   Bacteria, UA None seen None seen/Few  Urinalysis, Complete  Result Value Ref Range   Specific Gravity, UA >1.030 (H) 1.005 - 1.030   pH, UA 5.0 5.0 - 7.5   Color, UA Yellow Yellow   Appearance Ur Clear Clear   Leukocytes, UA Negative Negative   Protein, UA Negative Negative/Trace   Glucose, UA Negative Negative   Ketones, UA Negative Negative   RBC, UA Negative Negative   Bilirubin,  UA Negative Negative   Urobilinogen, Ur 0.2 0.2 - 1.0 mg/dL   Nitrite, UA Negative Negative   Microscopic Examination See below:   BLADDER SCAN AMB NON-IMAGING  Result Value Ref Range   Scan Result 36ml     Assessment & Plan:    1. SUI (stress urinary incontinence), male Encouraged continued Kegel exercises Surgical options reviewed including male sling. Given patient's age and comorbidities, we discussed that he is not an ideal surgical candidate. Discussed the possibility of [place] clamp and reviewed how to apply this device. He is interested in pursuing this. We will order the device and have him try it out. If this is not satisfactory, he was encouraged to return to discuss further options. - Urinalysis, Complete - BLADDER SCAN AMB NON-IMAGING - PSA  2. History of prostate cancer S/p prostatectomy 2001, presumably with NED Original pathology unknown today Recommend PSA check today, would follow annually   3. Left flank pain Likely musculoskeletal secondary to fall. No history of gross hematuria. UA today negative for microscopic blood No further work up indicated   Return if symptoms worsen or fail to improve, for call for appt if clamp not satifactory.  Hollice Espy, MD  [facility] [address] [place], [place] [address] [phone]

## 2016-01-21 NOTE — Patient Instructions (Signed)
Bradley Nelson Incontinence clamp- size regular [id number]

## 2016-01-22 LAB — PSA: Prostate Specific Ag, Serum: <0.1 ng/mL (ref 0.0–4.0)

## 2016-01-25 NOTE — Telephone Encounter (Signed)
Order was faxed to [facility] and patient's wife was notified that the pharmacy would be in contact once clamp was ordered and received/SW

## 2016-01-27 DIAGNOSIS — R32 Unspecified urinary incontinence: Secondary | ICD-10-CM | POA: Diagnosis not present

## 2016-03-16 DIAGNOSIS — H43813 Vitreous degeneration, bilateral: Secondary | ICD-10-CM | POA: Diagnosis not present

## 2016-04-25 DIAGNOSIS — I10 Essential (primary) hypertension: Secondary | ICD-10-CM | POA: Diagnosis not present

## 2016-04-25 DIAGNOSIS — E7800 Pure hypercholesterolemia, unspecified: Secondary | ICD-10-CM | POA: Diagnosis not present

## 2016-05-02 DIAGNOSIS — Z951 Presence of aortocoronary bypass graft: Secondary | ICD-10-CM | POA: Diagnosis not present

## 2016-05-02 DIAGNOSIS — I350 Nonrheumatic aortic (valve) stenosis: Secondary | ICD-10-CM | POA: Diagnosis not present

## 2016-05-02 DIAGNOSIS — Z0001 Encounter for general adult medical examination with abnormal findings: Secondary | ICD-10-CM | POA: Diagnosis not present

## 2016-05-02 DIAGNOSIS — I10 Essential (primary) hypertension: Secondary | ICD-10-CM | POA: Diagnosis not present

## 2016-05-02 DIAGNOSIS — Z953 Presence of xenogenic heart valve: Secondary | ICD-10-CM | POA: Diagnosis not present

## 2016-05-02 DIAGNOSIS — E7800 Pure hypercholesterolemia, unspecified: Secondary | ICD-10-CM | POA: Diagnosis not present

## 2016-05-02 DIAGNOSIS — I5022 Chronic systolic (congestive) heart failure: Secondary | ICD-10-CM | POA: Diagnosis not present

## 2016-07-19 DIAGNOSIS — I2510 Atherosclerotic heart disease of native coronary artery without angina pectoris: Secondary | ICD-10-CM | POA: Diagnosis not present

## 2016-07-19 DIAGNOSIS — Z9889 Other specified postprocedural states: Secondary | ICD-10-CM | POA: Diagnosis not present

## 2016-07-19 DIAGNOSIS — I5022 Chronic systolic (congestive) heart failure: Secondary | ICD-10-CM | POA: Diagnosis not present

## 2016-07-19 DIAGNOSIS — Z953 Presence of xenogenic heart valve: Secondary | ICD-10-CM | POA: Diagnosis not present

## 2016-07-19 DIAGNOSIS — I10 Essential (primary) hypertension: Secondary | ICD-10-CM | POA: Diagnosis not present

## 2016-07-19 DIAGNOSIS — I209 Angina pectoris, unspecified: Secondary | ICD-10-CM | POA: Diagnosis not present

## 2016-07-19 DIAGNOSIS — Z951 Presence of aortocoronary bypass graft: Secondary | ICD-10-CM | POA: Diagnosis not present

## 2016-07-19 DIAGNOSIS — E7800 Pure hypercholesterolemia, unspecified: Secondary | ICD-10-CM | POA: Diagnosis not present

## 2016-07-19 DIAGNOSIS — I350 Nonrheumatic aortic (valve) stenosis: Secondary | ICD-10-CM | POA: Diagnosis not present

## 2016-08-15 DIAGNOSIS — I5022 Chronic systolic (congestive) heart failure: Secondary | ICD-10-CM | POA: Diagnosis not present

## 2016-08-15 DIAGNOSIS — Z953 Presence of xenogenic heart valve: Secondary | ICD-10-CM | POA: Diagnosis not present

## 2016-08-15 DIAGNOSIS — I209 Angina pectoris, unspecified: Secondary | ICD-10-CM | POA: Diagnosis not present

## 2016-08-15 DIAGNOSIS — I2510 Atherosclerotic heart disease of native coronary artery without angina pectoris: Secondary | ICD-10-CM | POA: Diagnosis not present

## 2016-08-15 DIAGNOSIS — I350 Nonrheumatic aortic (valve) stenosis: Secondary | ICD-10-CM | POA: Diagnosis not present

## 2016-10-07 DIAGNOSIS — H5021 Vertical strabismus, right eye: Secondary | ICD-10-CM | POA: Diagnosis not present

## 2016-10-31 DIAGNOSIS — E7800 Pure hypercholesterolemia, unspecified: Secondary | ICD-10-CM | POA: Diagnosis not present

## 2016-10-31 DIAGNOSIS — I350 Nonrheumatic aortic (valve) stenosis: Secondary | ICD-10-CM | POA: Diagnosis not present

## 2016-10-31 DIAGNOSIS — I10 Essential (primary) hypertension: Secondary | ICD-10-CM | POA: Diagnosis not present

## 2016-10-31 DIAGNOSIS — I2510 Atherosclerotic heart disease of native coronary artery without angina pectoris: Secondary | ICD-10-CM | POA: Diagnosis not present

## 2016-11-14 DIAGNOSIS — I340 Nonrheumatic mitral (valve) insufficiency: Secondary | ICD-10-CM | POA: Diagnosis not present

## 2016-11-14 DIAGNOSIS — I2510 Atherosclerotic heart disease of native coronary artery without angina pectoris: Secondary | ICD-10-CM | POA: Diagnosis not present

## 2016-11-14 DIAGNOSIS — Z9889 Other specified postprocedural states: Secondary | ICD-10-CM | POA: Diagnosis not present

## 2016-11-14 DIAGNOSIS — E7800 Pure hypercholesterolemia, unspecified: Secondary | ICD-10-CM | POA: Diagnosis not present

## 2016-11-14 DIAGNOSIS — Z951 Presence of aortocoronary bypass graft: Secondary | ICD-10-CM | POA: Diagnosis not present

## 2017-03-16 DIAGNOSIS — I350 Nonrheumatic aortic (valve) stenosis: Secondary | ICD-10-CM | POA: Diagnosis not present

## 2017-03-16 DIAGNOSIS — I340 Nonrheumatic mitral (valve) insufficiency: Secondary | ICD-10-CM | POA: Diagnosis not present

## 2017-03-16 DIAGNOSIS — Z953 Presence of xenogenic heart valve: Secondary | ICD-10-CM | POA: Diagnosis not present

## 2017-03-16 DIAGNOSIS — E7800 Pure hypercholesterolemia, unspecified: Secondary | ICD-10-CM | POA: Diagnosis not present

## 2017-03-16 DIAGNOSIS — I2510 Atherosclerotic heart disease of native coronary artery without angina pectoris: Secondary | ICD-10-CM | POA: Diagnosis not present

## 2017-03-16 DIAGNOSIS — Z951 Presence of aortocoronary bypass graft: Secondary | ICD-10-CM | POA: Diagnosis not present

## 2017-03-16 DIAGNOSIS — I10 Essential (primary) hypertension: Secondary | ICD-10-CM | POA: Diagnosis not present

## 2017-03-16 DIAGNOSIS — I5020 Unspecified systolic (congestive) heart failure: Secondary | ICD-10-CM | POA: Diagnosis not present

## 2017-03-16 DIAGNOSIS — Z9889 Other specified postprocedural states: Secondary | ICD-10-CM | POA: Diagnosis not present

## 2017-04-27 DIAGNOSIS — I10 Essential (primary) hypertension: Secondary | ICD-10-CM | POA: Diagnosis not present

## 2017-04-27 DIAGNOSIS — E7800 Pure hypercholesterolemia, unspecified: Secondary | ICD-10-CM | POA: Diagnosis not present

## 2017-05-04 DIAGNOSIS — Z0001 Encounter for general adult medical examination with abnormal findings: Secondary | ICD-10-CM | POA: Diagnosis not present

## 2017-05-04 DIAGNOSIS — D696 Thrombocytopenia, unspecified: Secondary | ICD-10-CM | POA: Diagnosis not present

## 2017-05-04 DIAGNOSIS — I10 Essential (primary) hypertension: Secondary | ICD-10-CM | POA: Diagnosis not present

## 2017-05-04 DIAGNOSIS — E7800 Pure hypercholesterolemia, unspecified: Secondary | ICD-10-CM | POA: Diagnosis not present

## 2017-05-04 DIAGNOSIS — Z23 Encounter for immunization: Secondary | ICD-10-CM | POA: Diagnosis not present

## 2017-05-04 DIAGNOSIS — I2510 Atherosclerotic heart disease of native coronary artery without angina pectoris: Secondary | ICD-10-CM | POA: Diagnosis not present

## 2017-05-18 ENCOUNTER — Emergency Department
Admission: EM | Admit: 2017-05-18 | Discharge: 2017-05-18 | Disposition: A | Discharge status: Home / Self Care | Payer: PPO | Attending: Emergency Medicine | Admitting: Emergency Medicine

## 2017-05-18 ENCOUNTER — Encounter: Payer: Self-pay | Admitting: Emergency Medicine

## 2017-05-18 ENCOUNTER — Emergency Department: Payer: PPO

## 2017-05-18 DIAGNOSIS — Z87891 Personal history of nicotine dependence: Secondary | ICD-10-CM | POA: Insufficient documentation

## 2017-05-18 DIAGNOSIS — I509 Heart failure, unspecified: Secondary | ICD-10-CM | POA: Diagnosis not present

## 2017-05-18 DIAGNOSIS — Y9301 Activity, walking, marching and hiking: Secondary | ICD-10-CM | POA: Diagnosis not present

## 2017-05-18 DIAGNOSIS — W108XXA Fall (on) (from) other stairs and steps, initial encounter: Secondary | ICD-10-CM | POA: Diagnosis not present

## 2017-05-18 DIAGNOSIS — S59911A Unspecified injury of right forearm, initial encounter: Secondary | ICD-10-CM | POA: Diagnosis present

## 2017-05-18 DIAGNOSIS — I110 Hypertensive heart disease with heart failure: Secondary | ICD-10-CM | POA: Insufficient documentation

## 2017-05-18 DIAGNOSIS — Z79899 Other long term (current) drug therapy: Secondary | ICD-10-CM | POA: Diagnosis not present

## 2017-05-18 DIAGNOSIS — I2510 Atherosclerotic heart disease of native coronary artery without angina pectoris: Secondary | ICD-10-CM | POA: Insufficient documentation

## 2017-05-18 DIAGNOSIS — S5001XA Contusion of right elbow, initial encounter: Secondary | ICD-10-CM | POA: Insufficient documentation

## 2017-05-18 DIAGNOSIS — Y929 Unspecified place or not applicable: Secondary | ICD-10-CM | POA: Diagnosis not present

## 2017-05-18 DIAGNOSIS — W19XXXA Unspecified fall, initial encounter: Secondary | ICD-10-CM

## 2017-05-18 DIAGNOSIS — Y999 Unspecified external cause status: Secondary | ICD-10-CM | POA: Insufficient documentation

## 2017-05-18 DIAGNOSIS — Z7982 Long term (current) use of aspirin: Secondary | ICD-10-CM | POA: Insufficient documentation

## 2017-05-18 DIAGNOSIS — M7989 Other specified soft tissue disorders: Secondary | ICD-10-CM | POA: Diagnosis not present

## 2017-05-18 DIAGNOSIS — Y92009 Unspecified place in unspecified non-institutional (private) residence as the place of occurrence of the external cause: Secondary | ICD-10-CM

## 2017-05-18 NOTE — ED Provider Notes (Signed)
[facility] Emergency Department Provider Note ____________________________________________  Time seen: 1904  I have reviewed the triage vital signs and the nursing notes.  HISTORY  Chief Complaint  Fall  HPI Bradley Nelson is a 83 y.o. male presents to the ED for evaluation of injuries sustained following a mechanical fall. Patient describes he was walking downstairs when he tripped on the last step, in the dark. He fell landing primarily on his right elbow. Patient presents now with a triangle bandage arm sling in place. He denies any head injury, loss of consciousness, laceration, or abrasion. He reports some decreased swelling to the elbow since reported to the ED.He also notes normal range of motion but primarily has pain to the volar aspect of the elbow.  Past Medical History:  Diagnosis Date  . Acute blood loss anemia 01/25/2013  . Angina, class II ([place]) 01/22/2013  . Aortic heart valve narrowing 01/17/2013  . Bloody pleural effusion 01/25/2013  . Essential (primary) hypertension 06/02/2014  . H/O aortic valve replacement 01/25/2013  . H/O coronary artery bypass surgery 01/25/2013   Overview:  cabg x3 with LIMA to LAD, SVG to PDA, SVG to left circumflex with septal muomectomy and aortic valve replacement with Ce Magna bioprosthetic valve on 01-24-13 by [name] at [facility].   . Heart failure ([place]) 01/22/2013  . History of open heart surgery 01/25/2013  . Hypercholesterolemia 10/15/2014  . Malignant neoplastic disease ([place]) 04/28/2015   Overview:  prostate cancer, s/p prostatectomy    . Prostate cancer ([place])   . Skin cancer     Patient Active Problem List   Diagnosis Date Noted  . Malignant neoplastic disease ([place]) 04/28/2015  . Hypercholesterolemia 10/15/2014  . Essential (primary) hypertension 06/02/2014  . Acute blood loss anemia 01/25/2013  . Bloody pleural effusion 01/25/2013  . Pain following surgery or procedure 01/25/2013  . H/O aortic valve  replacement 01/25/2013  . H/O coronary artery bypass surgery 01/25/2013  . History of open heart surgery 01/25/2013  . Angina, class II ([place]) 01/22/2013  . Heart failure ([place]) 01/22/2013  . 3-vessel CAD 01/17/2013  . Aortic heart valve narrowing 01/17/2013    Past Surgical History:  Procedure Laterality Date  . AORTIC VALVE REPLACEMENT    . CARDIAC SURGERY    . CORONARY ARTERY BYPASS GRAFT    . HERNIA REPAIR    . PROSTATECTOMY  2001   Dr. [name]    Prior to Admission medications   Medication Sig Start Date End Date Taking? Authorizing Provider  aspirin 81 MG chewable tablet Chew 1 tablet by mouth daily.    [provider]  Coenzyme Q10 (CO Q-10) 100 MG CAPS Take 100 mg by mouth daily.    [provider]  Lactobacillus (ACIDOPHILUS) 100 MG CAPS Take 1 capsule by mouth daily.    [provider]  metoprolol (LOPRESSOR) 50 MG tablet Take 1 tablet by mouth 2 (two) times daily. 12/15/15   [provider]  Multiple Vitamins-Minerals (CENTRUM SILVER) tablet Take 1 tablet by mouth daily.    [provider]  oxyCODONE-acetaminophen (PERCOCET/ROXICET) 5-325 MG tablet Take 1 tablet by mouth every 4 (four) hours as needed for severe pain. 01/16/16   Gregor Hams, MD  ranitidine (ZANTAC) 150 MG tablet Take 1 tablet by mouth 2 (two) times daily. 01/06/16   [provider]  simvastatin (ZOCOR) 80 MG tablet Take 0.5 tablets by mouth every evening. 12/02/15   [provider]    Allergies Hydrocodone and  Oxycodone  Family History  Problem Relation Age of Onset  . Prostate cancer Father   . Kidney cancer Neg Hx   . Bladder Cancer Neg Hx     Social History Social History  Substance Use Topics  . Smoking status: Former Research scientist (life sciences)  . Smokeless tobacco: Never Used  . Alcohol use No    Review of Systems  Constitutional: Negative for fever. Eyes: Negative for visual changes. ENT: Negative for sore throat. Cardiovascular: Negative  for chest pain. Respiratory: Negative for shortness of breath. Gastrointestinal: Negative for abdominal pain, vomiting and diarrhea. Genitourinary: Negative for dysuria. Musculoskeletal: Negative for back pain. Right elbow pain as above. Skin: Negative for rash. Neurological: Negative for headaches, focal weakness or numbness. ____________________________________________  PHYSICAL EXAM:  VITAL SIGNS: ED Triage Vitals  Enc Vitals Group     BP 05/18/17 1758 (!) 157/100     Pulse Rate 05/18/17 1758 62     Resp 05/18/17 1758 16     Temp 05/18/17 1758 98.7 F (37.1 C)     Temp Source 05/18/17 1758 Oral     SpO2 05/18/17 1758 99 %     Weight 05/18/17 1758 165 lb (74.8 kg)     Height 05/18/17 1758 5\' 8"  (1.727 m)     Head Circumference --      Peak Flow --      Pain Score 05/18/17 1757 5     Pain Loc --      Pain Edu? --      Excl. in [place]? --     Constitutional: Alert and oriented. Well appearing and in no distress. Head: Normocephalic and atraumatic. Eyes: Conjunctivae are normal. PERRL. Normal extraocular movements Neck: Supple. No thyromegaly. Cardiovascular: Normal rate, regular rhythm. Normal distal pulses. Respiratory: Normal respiratory effort. No wheezes/rales/rhonchi. Musculoskeletal: right elbow and upper extremity without any obvious deformity, dislocation, or effusion. Patient with normal pronation and supination range of the elbow and distal forearm. He exhibited a normal composite fist bilaterally. Nontender with normal range of motion in all extremities.  Neurologic:  Normal gross sensation. Intrinsic and opposition testing are normal. Normal speech and language. No gross focal neurologic deficits are appreciated. Skin:  Skin is warm, dry and intact. No rash noted. ____________________________________________   RADIOLOGY  Right Elbow  IMPRESSION: Negative. ____________________________________________  PROCEDURES  Arm sling supplied   ____________________________________________  INITIAL IMPRESSION / ASSESSMENT AND PLAN / ED COURSE  Geriatric patient with ED evaluation of injury sustained following mechanical fall. His exam is overall benign. He has a right elbow without radiologic evidence of fracture or dislocation. His exam is consistent with a elbow sprain and contusion. He is placed in an arm sling for comfort, and will dosed Tylenol as needed for pain. He is advised to follow-up with his primary care provider for ongoing symptom management. Return to the ED as needed. ____________________________________________  FINAL CLINICAL IMPRESSION(S) / ED DIAGNOSES  Final diagnoses:  Fall in home, initial encounter  Contusion of right elbow, initial encounter      Melvenia Needles, PA-C 05/19/17 1611    Delman Kitten, MD 05/20/17 [phone]

## 2017-05-18 NOTE — Discharge Instructions (Signed)
You exam and x-ray are essentially normal following your fall at home. Be careful around the house. Take Tylenol as n eeded for pain relief. Follow-up with Dr. Gilford Rile as needed.

## 2017-05-18 NOTE — ED Notes (Signed)
Pt s/p fall with right elbow injury. Able to move fingers, wrist. Fingers warm. sensation intact.

## 2017-05-18 NOTE — ED Triage Notes (Signed)
Pt was walking down stairs and tripped on last stair. Fell landing on left elbow. Pain to elbow only.  ambulatory to triage.

## 2017-05-19 ENCOUNTER — Other Ambulatory Visit: Payer: Self-pay

## 2017-05-19 NOTE — Patient Outreach (Signed)
Outreach patient after ED visit on 05/18/17 at [facility].  I briefly spoke with patient.  He answered the phone and I told him my name and where I was calling from.  He said I am busy and hung up.  Unsuccessful letter was mailed on 05/19/17.

## 2017-08-07 DIAGNOSIS — L259 Unspecified contact dermatitis, unspecified cause: Secondary | ICD-10-CM | POA: Diagnosis not present

## 2017-09-22 DIAGNOSIS — E7800 Pure hypercholesterolemia, unspecified: Secondary | ICD-10-CM | POA: Diagnosis not present

## 2017-09-22 DIAGNOSIS — Z951 Presence of aortocoronary bypass graft: Secondary | ICD-10-CM | POA: Diagnosis not present

## 2017-09-22 DIAGNOSIS — Z953 Presence of xenogenic heart valve: Secondary | ICD-10-CM | POA: Diagnosis not present

## 2017-09-22 DIAGNOSIS — I10 Essential (primary) hypertension: Secondary | ICD-10-CM | POA: Diagnosis not present

## 2017-09-22 DIAGNOSIS — I5020 Unspecified systolic (congestive) heart failure: Secondary | ICD-10-CM | POA: Diagnosis not present

## 2017-09-22 DIAGNOSIS — I350 Nonrheumatic aortic (valve) stenosis: Secondary | ICD-10-CM | POA: Diagnosis not present

## 2017-09-22 DIAGNOSIS — I340 Nonrheumatic mitral (valve) insufficiency: Secondary | ICD-10-CM | POA: Diagnosis not present

## 2017-09-22 DIAGNOSIS — I2510 Atherosclerotic heart disease of native coronary artery without angina pectoris: Secondary | ICD-10-CM | POA: Diagnosis not present

## 2017-10-23 DIAGNOSIS — I10 Essential (primary) hypertension: Secondary | ICD-10-CM | POA: Diagnosis not present

## 2017-10-23 DIAGNOSIS — D696 Thrombocytopenia, unspecified: Secondary | ICD-10-CM | POA: Diagnosis not present

## 2017-11-02 DIAGNOSIS — I10 Essential (primary) hypertension: Secondary | ICD-10-CM | POA: Diagnosis not present

## 2017-11-02 DIAGNOSIS — Z0000 Encounter for general adult medical examination without abnormal findings: Secondary | ICD-10-CM | POA: Diagnosis not present

## 2017-11-02 DIAGNOSIS — N3945 Continuous leakage: Secondary | ICD-10-CM | POA: Diagnosis not present

## 2017-11-02 DIAGNOSIS — D696 Thrombocytopenia, unspecified: Secondary | ICD-10-CM | POA: Diagnosis not present

## 2017-11-02 DIAGNOSIS — E7800 Pure hypercholesterolemia, unspecified: Secondary | ICD-10-CM | POA: Diagnosis not present

## 2017-11-02 DIAGNOSIS — I2510 Atherosclerotic heart disease of native coronary artery without angina pectoris: Secondary | ICD-10-CM | POA: Diagnosis not present

## 2017-11-16 DIAGNOSIS — H2513 Age-related nuclear cataract, bilateral: Secondary | ICD-10-CM | POA: Diagnosis not present

## 2017-11-23 ENCOUNTER — Ambulatory Visit: Payer: PPO | Admitting: Urology

## 2017-11-23 ENCOUNTER — Encounter: Payer: Self-pay | Admitting: Urology

## 2017-11-23 VITALS — BP 125/78 | HR 59 | Ht 67.0 in | Wt 165.0 lb

## 2017-11-23 DIAGNOSIS — N393 Stress incontinence (female) (male): Secondary | ICD-10-CM | POA: Diagnosis not present

## 2017-11-23 DIAGNOSIS — Z8546 Personal history of malignant neoplasm of prostate: Secondary | ICD-10-CM | POA: Diagnosis not present

## 2017-11-23 NOTE — Progress Notes (Signed)
11/23/2017 8:31 AM   Bradley Nelson [birth year] [id number]  Referring provider: Katheren Shams [address] [place], [place] [address]  Chief Complaint  Patient presents with  . Urinary Incontinence    HPI:  84 year old male who returns today primarily to discuss his ongoing severe stress urinary incontinence.  He status post open radical prostatectomy in 2001 by Dr. Yves Dill.  He had no recurrence in his most recent PSA in 6/17 remained undetectable.  Since surgery, he has had some degree of stress incontinence.  Initially was minimal but over the years, has dramatically worsened.  He was seen by me in 2017 for the same issue.  He reports that he constantly dribbles and has very little control over his urine.   He now wears several diapers a day which are saturated.  He has tried the [facility] clamp but develops penile irritation and cannot wear it for extended periods of time.  He is done physical therapy in the remote past but has not been doing any of the exercises recently.   He is also had injection of collagen more than 10 years ago into his urethra which was not effective.  Specifically, he is concerned about the cost of diapers.  He and his wife are specifically asking if there is any new "pills" on the market to help with this problem.  They are not interested in any surgical intervention at this time.  He has good dexterity.   PMH: Past Medical History:  Diagnosis Date  . Acute blood loss anemia 01/25/2013  . Angina, class II ([place]) 01/22/2013  . Aortic heart valve narrowing 01/17/2013  . Bloody pleural effusion 01/25/2013  . Essential (primary) hypertension 06/02/2014  . H/O aortic valve replacement 01/25/2013  . H/O coronary artery bypass surgery 01/25/2013   Overview:  cabg x3 with LIMA to LAD, SVG to PDA, SVG to left circumflex with septal muomectomy and aortic valve replacement with Ce Magna bioprosthetic valve on 01-24-13 by [name] at [facility].   . Heart  failure ([place]) 01/22/2013  . History of open heart surgery 01/25/2013  . Hypercholesterolemia 10/15/2014  . Malignant neoplastic disease ([place]) 04/28/2015   Overview:  prostate cancer, s/p prostatectomy    . Prostate cancer ([place])   . Skin cancer     Surgical History: Past Surgical History:  Procedure Laterality Date  . AORTIC VALVE REPLACEMENT    . CARDIAC SURGERY    . CORONARY ARTERY BYPASS GRAFT    . HERNIA REPAIR    . PROSTATECTOMY  2001   Dr. [name]    Home Medications:  Allergies as of 11/23/2017      Reactions   Hydrocodone Other (See Comments)   Oxycodone Other (See Comments)      Medication List        Accurate as of 11/23/17  8:31 AM. Always use your most recent med list.          Acidophilus 100 MG Caps Take 1 capsule by mouth daily.   aspirin 81 MG chewable tablet Chew 1 tablet by mouth daily.   CENTRUM SILVER tablet Take 1 tablet by mouth daily.   Co Q-10 100 MG Caps Take 100 mg by mouth daily.   metoprolol tartrate 50 MG tablet Commonly known as:  LOPRESSOR Take 1 tablet by mouth 2 (two) times daily.   ranitidine 150 MG tablet Commonly known as:  ZANTAC Take 1 tablet by mouth 2 (two) times daily.   simvastatin 80 MG tablet Commonly known  as:  ZOCOR Take 0.5 tablets by mouth every evening.       Allergies:  Allergies  Allergen Reactions  . Hydrocodone Other (See Comments)  . Oxycodone Other (See Comments)    Family History: Family History  Problem Relation Age of Onset  . Prostate cancer Father   . Kidney cancer Neg Hx   . Bladder Cancer Neg Hx     Social History:  reports that he has quit smoking. He has never used smokeless tobacco. He reports that he does not drink alcohol or use drugs.  ROS: UROLOGY Frequent Urination?: Yes Hard to postpone urination?: Yes Burning/pain with urination?: No Get up at night to urinate?: Yes Leakage of urine?: Yes Urine stream starts and stops?: No Trouble starting stream?: No Do you have to  strain to urinate?: No Blood in urine?: No Urinary tract infection?: No Sexually transmitted disease?: No Injury to kidneys or bladder?: No Painful intercourse?: No Weak stream?: No Erection problems?: Yes Penile pain?: No  Gastrointestinal Nausea?: No Vomiting?: No Indigestion/heartburn?: No Diarrhea?: No Constipation?: No  Constitutional Fever: No Night sweats?: No Weight loss?: No Fatigue?: No  Skin Skin rash/lesions?: No Itching?: No  Eyes Blurred vision?: No Double vision?: No  Ears/Nose/Throat Sore throat?: No Sinus problems?: No  Hematologic/Lymphatic Swollen glands?: No Easy bruising?: No  Cardiovascular Leg swelling?: No Chest pain?: No  Respiratory Cough?: No Shortness of breath?: Yes  Endocrine Excessive thirst?: No  Musculoskeletal Back pain?: No Joint pain?: No  Neurological Headaches?: No Dizziness?: Yes  Psychologic Depression?: No Anxiety?: No  Physical Exam: BP 125/78   Pulse (!) 59   Ht 5\' 7"  (1.702 m)   Wt 165 lb (74.8 kg)   BMI 25.84 kg/m   Constitutional:  Alert and oriented, No acute distress.  Pleasant, accompanied by wife today.  Elderly. HEENT: [place] AT, moist mucus membranes.  Trachea midline, no masses. Cardiovascular: No clubbing, cyanosis, or edema. Respiratory: Normal respiratory effort, no increased work of breathing. Skin: No rashes, bruises or suspicious lesions. Neurologic: Grossly intact, no focal deficits, moving all 4 extremities. Psychiatric: Normal mood and affect.  Laboratory Data: Lab Results  Component Value Date   WBC 10.1 09/16/2015   HGB 16.1 09/16/2015   HCT 48.1 09/16/2015   MCV 93.8 09/16/2015   PLT 163 09/16/2015    Lab Results  Component Value Date   CREATININE 1.22 09/16/2015   PSA 01/2015 less than 0.1  Urinalysis N/a  Pertinent Imaging: N/a  Assessment & Plan:    1. SUI (stress urinary incontinence), male Reviewed options again today. Although physical therapy will  certainly not cure his incontinence, and may help with his pelvic floor strength and decrease the amount of leakage. We discussed the surgical options including male sling versus artificial urinary sphincter, if he is interested in pursuing any of these options, will likely refer him to a tertiary care center.  At this time, he is not interested in surgical intervention. Alternatives to the [facility] clamp were also discussed including newer devices which can be bought online.  Unfortunately, the patient is not computer illiterate and is unable to order these devices.  I work with our office staff to come up with a solution if he is interested in trying any of these alternative products.  We also discussed condom cath which the patient is not interested in. - Ambulatory referral to Physical Therapy  2. History of prostate cancer S/p prostatectomy 2001, presumably with NED Original pathology unknown today No recurrence  Hollice Espy, MD  [facility] [address], [place], [place] [address] [phone]

## 2017-12-04 ENCOUNTER — Encounter: Payer: Self-pay | Admitting: Physical Therapy

## 2017-12-04 ENCOUNTER — Ambulatory Visit: Payer: PPO | Attending: Urology | Admitting: Physical Therapy

## 2017-12-04 ENCOUNTER — Other Ambulatory Visit: Payer: Self-pay

## 2017-12-04 VITALS — BP 138/80

## 2017-12-04 DIAGNOSIS — R29898 Other symptoms and signs involving the musculoskeletal system: Secondary | ICD-10-CM

## 2017-12-04 DIAGNOSIS — R278 Other lack of coordination: Secondary | ICD-10-CM | POA: Diagnosis not present

## 2017-12-04 NOTE — Patient Instructions (Addendum)
2 bottle of water and 4 servings of tea and coffee per day   ( tea and coffee are bladder irritants)   Work on decreasing bladder irritant from 4 to 3,  And increasing water from 2 bottles to 3 ( 1:1 ratio of bladder irritant to water)     ______   Proper body mechanics with getting out of a chair to decrease strain  on back &pelvic floor   Avoid holding your breath when Getting out of the chair:  Scoot to front part of chair chair Heels behind feet, feet are hip width apart, nose over toes  Inhale like you are smelling roses Exhale to stand     ________  Less time in recliner    __________  Shoe lift in L shoe

## 2017-12-05 NOTE — Therapy (Signed)
[place] MAIN [facility] SERVICES [address] [place], [place], [address] Phone: [phone]   Fax:  [phone]  Physical Therapy Evaluation  Patient Details  Name: Bradley Nelson MRN: [id number] Date of Birth: July 14, 1932 Referring Provider: Erlene Quan    Encounter Date: 12/04/2017  PT End of Session - 12/05/17 1428    Visit Number  1    Number of Visits  12    Date for PT Re-Evaluation  02/27/18    PT Start Time  0903    PT Stop Time  1000    PT Time Calculation (min)  57 min       Past Medical History:  Diagnosis Date  . Acute blood loss anemia 01/25/2013  . Angina, class II ([place]) 01/22/2013  . Aortic heart valve narrowing 01/17/2013  . Bloody pleural effusion 01/25/2013  . Essential (primary) hypertension 06/02/2014  . H/O aortic valve replacement 01/25/2013  . H/O coronary artery bypass surgery 01/25/2013   Overview:  cabg x3 with LIMA to LAD, SVG to PDA, SVG to left circumflex with septal muomectomy and aortic valve replacement with Ce Magna bioprosthetic valve on 01-24-13 by [name] at [facility].   . Heart failure ([place]) 01/22/2013  . History of open heart surgery 01/25/2013  . Hypercholesterolemia 10/15/2014  . Malignant neoplastic disease ([place]) 04/28/2015   Overview:  prostate cancer, s/p prostatectomy    . Prostate cancer ([place])   . Skin cancer     Past Surgical History:  Procedure Laterality Date  . AORTIC VALVE REPLACEMENT    . CARDIAC SURGERY    . CORONARY ARTERY BYPASS GRAFT    . HERNIA REPAIR    . PROSTATECTOMY  2001   Dr. [name]    Vitals:   12/04/17 0916  BP: 138/80     Subjective Assessment - 12/04/17 0914    Subjective  Pt has had urinary leakage since 2001 and gradually worsened until the last 8 years. Pt changes 2-3 pads a day ( diapers). Pt does not wear one pad at night. Wife reports he leaks to the floor while he changes diaper.   Daily fluid  intake: summertime, 2-3 bottles of water , 2 - 8 fl oz of tea, 2 cups of  coffee. Nocturia: 3 x per night. Pt has not undergone a sleep study.    Contributing factors:   _Daily bowel movements with strain most of the time. Pt takes a stool softener.   _Pt has had 4-5 hernia repairs over several years.  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 Pt has been retired for the past 20 years.  _Pt has had multiple cardiac surgeries ( 3 stents, valve replacement) and currently has a leaking mitral valve.  _Sedentary lifestyle: Pt does not have a walking routine. [place].      Patient is accompained by:  Family member wife     Pertinent History  Hx of multiple cardiac conditions and surgery , skin CA, prostate CA , hernia repair x 2  when prostate was removed in 2001, leg cramps in the morning, dizziness since heart surgery 5 years ago which his MD is aware of,     Patient Stated Goals  to slow down the leakage          [facility] PT Assessment - 12/04/17 0941      Assessment   Medical Diagnosis  SUI  Referring Provider  Erlene Quan       Precautions   Precautions  None      Restrictions   Weight Bearing Restrictions  Yes    Other Position/Activity Restrictions  no heavy lifting due to cardiac conditions and surgeries       Balance Screen   Has the patient fallen in the past 6 months  No      Observation/Other Assessments   Observations  L iliac crest is shorter in stance, levelled after shoe lift donned     Sit to Stand   Comments  difficulty with propulsion with 2 failed trials without BUE.  5Sit to Stand  19 sec no BUE on chair       AROM   Overall AROM Comments  limited spinal ROM 50% rotation, sidebend       Strength   Overall Strength Comments  L hip / knee/ flex/ ext 3/5, R 4/5        Palpation   SI assessment   L lumbar lordosis     Palpation comment  restricted scars over abdomen and sternum       Ambulation/Gait   Gait Comments  0.73 m/s , decreased hip flex, poor foot  clearance                 Objective measurements completed on examination: See above findings.    Pelvic Floor Special Questions - 12/04/17 1003    External Palpation  through clothing, sitting: limited pelvic floor activation with palpation at ischial tuberosity                     PT Long Term Goals - 12/04/17 0931      PT LONG TERM GOAL #1   Title  Decrease diapers from 2-3 x day to < 2 x day in order to improve QOL     Time  12    Period  Weeks    Status  New    Target Date  02/26/18      PT LONG TERM GOAL #2   Title  Pt will be referred for a sleep study     Time  2    Period  Weeks    Status  New      PT LONG TERM GOAL #3   Title  Pt will demo no breathholding/ proper technique with Sit to stand, and decreased time with 5 TST from 19.62  sec to < 13 sec in order to minimzie risks for falls      Time  8    Period  Weeks    Status  New      PT LONG TERM GOAL #4   Title  Pt will demo levelled pelvis in stance and supine with compliance to shoe lift     Time  2    Period  Weeks    Status  New    Target Date  12/19/17      PT LONG TERM GOAL #5   Title  Pt will decrease his IPSS score from 15 pts (moderate impairment)  to < 10 pts ( less moderate) or QOL score from 5 ( unhappy) to < 3 pts ( mixed)  in order to improve QOL    Time  10    Period  Weeks    Status  New    Target Date  02/13/18      Additional Long Term Goals   Additional Long Term  Goals  Yes      PT LONG TERM GOAL #6   Title  PT will communicate with MDs about referral to sleep study to screen for OSA 2/2 to his cardiac, sedentary , and nocturia risk factors in order to minimize health issues    Time  4    Period  Weeks    Status  New    Target Date  01/02/18      PT LONG TERM GOAL #7   Title  Pt will demo decreased cardiac and supic scar restrictions in order to elicit increased diaphragmatic/ pelvic floor coordination for pelvic floor strengthening     Time  8    Period   Weeks    Status  New    Target Date  01/30/18      PT LONG TERM GOAL #8   Title  Pt will increase gait from 0.73 m/s to > 1/0 m/s in order to minimize risk for falls    Time  10    Period  Weeks    Status  New    Target Date  02/13/18             Plan - 12/04/17 0912    Clinical Impression Statement Pt is a 84 yo male with urinary leakage since 2001 and gradually worsened until the last 8 years. Pt also has nocturia of 3-4 night and straining with bowel movements.  Pt's multiple contributing factors listed below. Pt's clinical presentations include leg length difference, poor posture, slowed gait, increased scar restrictions over suprapubic and sternal regions, and poor body mechanics which strain onto his pelvic floor mm and deep core. Following today's session, pt showed more levelled leg length with a shoe lift in L shoe and improved body mechanics with sit to stand and log rolling to minimize pelvic floor/ abdominal strain. Pt also was educated on decreasing bladder irritants and increasing water gradually. Pt will benefit from a sleep study to screen for OSA given his risk factors: nocturia, cardiac surgeries/ compromised system, and sedentary lifestyle. PT to communicate with his PCP or cardiologist re: this recommendation.      History and Personal Factors relevant to plan of care:  Hx of multiple cardiac conditions, skin CA, prostate CA , hernia repair x 2  when prostate was removed in 2001,     Clinical Presentation  Evolving    Clinical Decision Making  High    Rehab Potential  Good    PT Frequency  1x / week    PT Duration  12 weeks    PT Treatment/Interventions  Neuromuscular re-education;Manual techniques;Patient/family education;Therapeutic exercise;Therapeutic activities;Functional mobility training;Moist Heat;Scar mobilization;Manual lymph drainage;Electrical Stimulation;Biofeedback    Consulted and Agree with Plan of Care  Patient;Family member/caregiver        Patient will benefit from skilled therapeutic intervention in order to improve the following deficits and impairments:  Postural dysfunction, Improper body mechanics, Increased muscle spasms, Difficulty walking, Decreased safety awareness, Decreased endurance, Decreased range of motion, Impaired flexibility, Decreased strength, Decreased mobility, Decreased coordination, Cardiopulmonary status limiting activity, Abnormal gait, Decreased balance, Decreased activity tolerance, Hypomobility  Visit Diagnosis: Other lack of coordination  Other symptoms and signs involving the musculoskeletal system     Problem List Patient Active Problem List   Diagnosis Date Noted  . Malignant neoplastic disease ([place]) 04/28/2015  . Hypercholesterolemia 10/15/2014  . Essential (primary) hypertension 06/02/2014  . Acute blood loss anemia 01/25/2013  . Bloody pleural effusion 01/25/2013  . Pain following  surgery or procedure 01/25/2013  . H/O aortic valve replacement 01/25/2013  . H/O coronary artery bypass surgery 01/25/2013  . History of open heart surgery 01/25/2013  . Angina, class II ([place]) 01/22/2013  . Heart failure ([place]) 01/22/2013  . 3-vessel CAD 01/17/2013  . Aortic heart valve narrowing 01/17/2013    Jerl Mina ,PT, DPT, E-RYT  12/05/2017, 2:45 PM  [place] MAIN [facility] SERVICES [address] [place], [place], [address] Phone: [phone]   Fax:  [phone]  Name: ELDEAN NANNA MRN: [id number] Date of Birth: 1931-11-16

## 2017-12-13 ENCOUNTER — Ambulatory Visit: Payer: PPO | Attending: Urology | Admitting: Physical Therapy

## 2017-12-13 DIAGNOSIS — R29898 Other symptoms and signs involving the musculoskeletal system: Secondary | ICD-10-CM | POA: Diagnosis not present

## 2017-12-13 DIAGNOSIS — R278 Other lack of coordination: Secondary | ICD-10-CM | POA: Diagnosis not present

## 2017-12-13 NOTE — Patient Instructions (Signed)
PELVIC FLOOR / KEGEL EXERCISES   Pelvic floor/ Kegel exercises are used to strengthen the muscles in the base of your pelvis that are responsible for supporting your pelvic organs and preventing urine/feces leakage. Based on your therapist's recommendations, they can be performed while standing, sitting, or lying down.  Make yourself aware of this muscle group by using these cues:  Imagine you are in a crowded room and you feel the need to pass gas. Your response is to pull up and in at the rectum.  Close the rectum. Pull the muscles up inside your body,feeling your scrotum lifting as well . Feel the pelvic floor muscles lift as if you were walking into a cold lake.  Place your hand on top of your pubic bone. Tighten and draw in the muscles around the anal muscles without squeezing the buttock muscles.  Common Errors:  Breath holding: If you are holding your breath, you may be bearing down against your bladder instead of pulling it up. If you belly bulges up while you are squeezing, you are holding your breath. Be sure to breathe gently in and out while exercising. Counting out loud may help you avoid holding your breath.  Accessory muscle use: You should not see or feel other muscle movement when performing pelvic floor exercises. When done properly, no one can tell that you are performing the exercises. Keep the buttocks, belly and inner thighs relaxed.  Overdoing it: Your muscles can fatigue and stop working for you if you over-exercise. You may actually leak more or feel soreness at the lower abdomen or rectum.  YOUR HOME EXERCISE PROGRAM     SHORT HOLDS: Position: on back, sitting in chair,   Inhale and then exhale. Then squeeze the muscle.  (Be sure to let belly sink in with exhales and not push outward)  Perform 10 repetitions, 4   Times/day   ( 10 reps morning and night,  10 reps mid morning and mid afternoon seated chair   **ALSO SQUEEZE BEFORE YOUR SNEEZE, COUGH, LAUGH to  decrease downward pressure   **ALSO EXHALE BEFORE YOU RISE AGAINST GRAVITY (lifting, sit to stand, from squat to stand)    __________   When sitting in chair, feet on the ground not crossed, hands pushing on chair,  Chest lifts shoulders back and back  10 reps

## 2017-12-13 NOTE — Therapy (Addendum)
[place] MAIN [facility] SERVICES [address] [place], [place], [address] Phone: [phone]   Fax:  [phone]  Physical Therapy Treatment  Patient Details  Name: Bradley Nelson MRN: [id number] Date of Birth: [birth year] Referring Provider: Erlene Quan    Encounter Date: 12/13/2017  PT End of Session - 12/13/17 1617    Visit Number  2    Number of Visits  12    Date for PT Re-Evaluation  02/27/18    PT Start Time  2025    PT Stop Time  1700    PT Time Calculation (min)  45 min       Past Medical History:  Diagnosis Date  . Acute blood loss anemia 01/25/2013  . Angina, class II ([place]) 01/22/2013  . Aortic heart valve narrowing 01/17/2013  . Bloody pleural effusion 01/25/2013  . Essential (primary) hypertension 06/02/2014  . H/O aortic valve replacement 01/25/2013  . H/O coronary artery bypass surgery 01/25/2013   Overview:  cabg x3 with LIMA to LAD, SVG to PDA, SVG to left circumflex with septal muomectomy and aortic valve replacement with Ce Magna bioprosthetic valve on 01-24-13 by [name] at [facility].   . Heart failure ([place]) 01/22/2013  . History of open heart surgery 01/25/2013  . Hypercholesterolemia 10/15/2014  . Malignant neoplastic disease ([place]) 04/28/2015   Overview:  prostate cancer, s/p prostatectomy    . Prostate cancer ([place])   . Skin cancer     Past Surgical History:  Procedure Laterality Date  . AORTIC VALVE REPLACEMENT    . CARDIAC SURGERY    . CORONARY ARTERY BYPASS GRAFT    . HERNIA REPAIR    . PROSTATECTOMY  2001   Dr. [name]    There were no vitals filed for this visit.  Subjective Assessment - 12/13/17 1617    Subjective  Pt has worked on drinking more water which has helped to decrease leakage and to wear his pads longer . The shoe lift made him tilt forward so he stopped wearing.     Patient is accompained by:  Family member wife     Pertinent History  Hx of multiple cardiac conditions and surgery , skin CA, prostate CA ,  hernia repair x 2  when prostate was removed in 2001, leg cramps in the morning, dizziness since heart surgery 5 years ago which his MD is aware of,     Patient Stated Goals  to slow down the leakage          [facility] PT Assessment - 12/13/17 1704      Coordination   Gross Motor Movements are Fluid and Coordinated  -- increased diaphragmatic excursion lateral and ant (post Tx()      Palpation   Spinal mobility  limited mm mobility at paraspinal mm    Palpation comment  decreased scar restriction over sternum and suprapubic area, decreased fascial restrictions inguinal area                 Pelvic Floor Special Questions - 12/13/17 1708    External Perineal Exam  through clothing, elicited proper lift of pelvic floor with cue for pelvic floor in supine and sitting but required cues for less overuse of gluts         [facility] Adult PT Treatment/Exercise - 12/13/17 1708      Neuro Re-ed    Neuro Re-ed Details   see pts instructions      Manual Therapy   Manual therapy  comments  gentle / fascial releases with coordinated breathing ,  STM at paraspinals with MWM on L              PT Education - 12/13/17 1703    Education provided  Yes          PT Long Term Goals - 12/04/17 0931      PT LONG TERM GOAL #1   Title  Decrease diapers from 2-3 x day to < 2 x day in order to improve QOL     Time  12    Period  Weeks    Status  New    Target Date  02/26/18      PT LONG TERM GOAL #2   Title  Pt will be referred for a sleep study     Time  2    Period  Weeks    Status  New      PT LONG TERM GOAL #3   Title  Pt will demo no breathholding/ proper technique with Sit to stand, and decreased time with 5 TST from 19.62  sec to < 13 sec in order to minimzie risks for falls      Time  8    Period  Weeks    Status  New      PT LONG TERM GOAL #4   Title  Pt will demo levelled pelvis in stance and supine with compliance to shoe lift     Time  2    Period  Weeks    Status   New    Target Date  12/19/17      PT LONG TERM GOAL #5   Title  Pt will decrease his IPSS score from 15 pts (moderate impairment)  to < 10 pts ( less moderate) or QOL score from 5 ( unhappy) to < 3 pts ( mixed)  in order to improve QOL    Time  10    Period  Weeks    Status  New    Target Date  02/13/18      Additional Long Term Goals   Additional Long Term Goals  Yes      PT LONG TERM GOAL #6   Title  PT will communicate with MDs about referral to sleep study to screen for OSA 2/2 to his cardiac, sedentary , and nocturia risk factors in order to minimize health issues    Time  4    Period  Weeks    Status  New    Target Date  01/02/18      PT LONG TERM GOAL #7   Title  Pt will demo decreased cardiac and supic scar restrictions in order to elicit increased diaphragmatic/ pelvic floor coordination for pelvic floor strengthening     Time  8    Period  Weeks    Status  New    Target Date  01/30/18      PT LONG TERM GOAL #8   Title  Pt will increase gait from 0.73 m/s to > 1/0 m/s in order to minimize risk for falls    Time  10    Period  Weeks    Status  New    Target Date  02/13/18            Plan - 12/13/17 1709    Clinical Impression Statement  Pt is compliant with increasing water and decreasing coffee which he states has helped with leakage. Today pt demo'd  increased lateral/ anterior exursion of thorax following gentle fascial releases over scars at sternum and suprapubic area. Pt tolerated Tx without complaints and was placed in a reclined position to maintain head position elevated to heart 2/2 cardiac conditions.  Diaphragmatic movement facilitated improved TrA and pelvic floor.   Pt elicited proper lift of pelvic floor with cue for pelvic floor in supine and sitting but required cues for less overuse of gluts.  Pt continues to benefit from skilled PT.      Rehab Potential  Good    PT Frequency  1x / week    PT Duration  12 weeks    PT Treatment/Interventions   Neuromuscular re-education;Manual techniques;Patient/family education;Therapeutic exercise;Therapeutic activities;Functional mobility training;Moist Heat;Scar mobilization;Manual lymph drainage;Electrical Stimulation;Biofeedback    Consulted and Agree with Plan of Care  Patient;Family member/caregiver       Patient will benefit from skilled therapeutic intervention in order to improve the following deficits and impairments:  Postural dysfunction, Improper body mechanics, Increased muscle spasms, Difficulty walking, Decreased safety awareness, Decreased endurance, Decreased range of motion, Impaired flexibility, Decreased strength, Decreased mobility, Decreased coordination, Cardiopulmonary status limiting activity, Abnormal gait, Decreased balance, Decreased activity tolerance, Hypomobility  Visit Diagnosis: Other lack of coordination  Other symptoms and signs involving the musculoskeletal system     Problem List Patient Active Problem List   Diagnosis Date Noted  . Malignant neoplastic disease ([place]) 04/28/2015  . Hypercholesterolemia 10/15/2014  . Essential (primary) hypertension 06/02/2014  . Acute blood loss anemia 01/25/2013  . Bloody pleural effusion 01/25/2013  . Pain following surgery or procedure 01/25/2013  . H/O aortic valve replacement 01/25/2013  . H/O coronary artery bypass surgery 01/25/2013  . History of open heart surgery 01/25/2013  . Angina, class II ([place]) 01/22/2013  . Heart failure ([place]) 01/22/2013  . 3-vessel CAD 01/17/2013  . Aortic heart valve narrowing 01/17/2013    Jerl Mina ,PT, DPT, E-RYT  12/13/2017, 5:12 PM  [place] MAIN [facility] SERVICES [address] [place], [place], [address] Phone: [phone]   Fax:  [phone]  Name: Bradley Nelson MRN: [id number] Date of Birth: [birth year]

## 2017-12-20 ENCOUNTER — Ambulatory Visit: Payer: PPO | Admitting: Physical Therapy

## 2017-12-20 DIAGNOSIS — R29898 Other symptoms and signs involving the musculoskeletal system: Secondary | ICD-10-CM

## 2017-12-20 DIAGNOSIS — R278 Other lack of coordination: Secondary | ICD-10-CM

## 2017-12-20 NOTE — Therapy (Signed)
[place] MAIN [facility] SERVICES [address] [place], [place], [address] Phone: [phone]   Fax:  [phone]  Physical Therapy Treatment  Patient Details  Name: Bradley Nelson MRN: [id number] Date of Birth: Dec 29, 1931 Referring Provider: Erlene Quan    Encounter Date: 12/20/2017  PT End of Session - 12/20/17 0956    Visit Number  3    Number of Visits  12    Date for PT Re-Evaluation  02/27/18    PT Start Time  0900    PT Stop Time  0956    PT Time Calculation (min)  56 min       Past Medical History:  Diagnosis Date  . Acute blood loss anemia 01/25/2013  . Angina, class II ([place]) 01/22/2013  . Aortic heart valve narrowing 01/17/2013  . Bloody pleural effusion 01/25/2013  . Essential (primary) hypertension 06/02/2014  . H/O aortic valve replacement 01/25/2013  . H/O coronary artery bypass surgery 01/25/2013   Overview:  cabg x3 with LIMA to LAD, SVG to PDA, SVG to left circumflex with septal muomectomy and aortic valve replacement with Ce Magna bioprosthetic valve on 01-24-13 by [name] at [facility].   . Heart failure ([place]) 01/22/2013  . History of open heart surgery 01/25/2013  . Hypercholesterolemia 10/15/2014  . Malignant neoplastic disease ([place]) 04/28/2015   Overview:  prostate cancer, s/p prostatectomy    . Prostate cancer ([place])   . Skin cancer     Past Surgical History:  Procedure Laterality Date  . AORTIC VALVE REPLACEMENT    . CARDIAC SURGERY    . CORONARY ARTERY BYPASS GRAFT    . HERNIA REPAIR    . PROSTATECTOMY  2001   Dr. [name]    There were no vitals filed for this visit.  Subjective Assessment - 12/20/17 0940    Subjective  Pt reports his L neck spasms sometimes. His wife reports they have to sit on the L side of the church to help his neck. Pt has been doing his kegels      Patient is accompained by:  Family member wife     Pertinent History  Hx of multiple cardiac conditions and surgery , skin CA, prostate CA , hernia repair  x 2  when prostate was removed in 2001, leg cramps in the morning, dizziness since heart surgery 5 years ago which his MD is aware of,     Patient Stated Goals  to slow down the leakage          [facility] PT Assessment - 12/20/17 0945      Palpation   Spinal mobility  increased tightenss on L medial scap/. paraspinals. scoliotic curve R                  Pelvic Floor Special Questions - 12/20/17 0944    External Perineal Exam  through clothing, elicited proper lift of pelvic floor with cue for pelvic floor in supine and sitting but required cues for anterior tilt pelvis and thoracic extension , progressed to 5 quick reps         [facility] Adult PT Treatment/Exercise - 12/20/17 0938      Neuro Re-ed    Neuro Re-ed Details   see pt instructions      Manual Therapy   Manual therapy comments  STM along paraspinals, intercostals , gentle fascial restrictions over sternal scar              PT Education - 12/20/17  0955    Education provided  Yes    Education Details  HEP    Person(s) Educated  Patient    Methods  Explanation;Tactile cues;Demonstration;Verbal cues;Handout    Comprehension  Verbalized understanding;Returned demonstration;Verbal cues required          PT Long Term Goals - 12/04/17 0931      PT LONG TERM GOAL #1   Title  Decrease diapers from 2-3 x day to < 2 x day in order to improve QOL     Time  12    Period  Weeks    Status  New    Target Date  02/26/18      PT LONG TERM GOAL #2   Title  Pt will be referred for a sleep study     Time  2    Period  Weeks    Status  New      PT LONG TERM GOAL #3   Title  Pt will demo no breathholding/ proper technique with Sit to stand, and decreased time with 5 TST from 19.62  sec to < 13 sec in order to minimzie risks for falls      Time  8    Period  Weeks    Status  New      PT LONG TERM GOAL #4   Title  Pt will demo levelled pelvis in stance and supine with compliance to shoe lift     Time  2     Period  Weeks    Status  New    Target Date  12/19/17      PT LONG TERM GOAL #5   Title  Pt will decrease his IPSS score from 15 pts (moderate impairment)  to < 10 pts ( less moderate) or QOL score from 5 ( unhappy) to < 3 pts ( mixed)  in order to improve QOL    Time  10    Period  Weeks    Status  New    Target Date  02/13/18      Additional Long Term Goals   Additional Long Term Goals  Yes      PT LONG TERM GOAL #6   Title  PT will communicate with MDs about referral to sleep study to screen for OSA 2/2 to his cardiac, sedentary , and nocturia risk factors in order to minimize health issues    Time  4    Period  Weeks    Status  New    Target Date  01/02/18      PT LONG TERM GOAL #7   Title  Pt will demo decreased cardiac and supic scar restrictions in order to elicit increased diaphragmatic/ pelvic floor coordination for pelvic floor strengthening     Time  8    Period  Weeks    Status  New    Target Date  01/30/18      PT LONG TERM GOAL #8   Title  Pt will increase gait from 0.73 m/s to > 1/0 m/s in order to minimize risk for falls    Time  10    Period  Weeks    Status  New    Target Date  02/13/18            Plan - 12/20/17 0956    Clinical Impression Statement  Pt progressed to pelvic floor contractions from 3 quick reps to 5 quick reps. Pt was advised to perform 5 x a  day and associated with meals and mid morning. afternoon to increase compliance. Addressed pt's forward head posture by apply manual Tx to minimize sternal scar restrictions , tight intercostals, scoliosis -related mm tightness on L paraspinals/ medial aspect of scapula. Pt reported he felt looser in his neck and shoulder on the L.  Initiated cervical/ scapular retraction strengthening .  Pt demo'd correct form with HEP and increased diaphramgatic excursion anterioroly/ laterally. Plan to assess pelvic floor at next session.     Rehab Potential  Good    PT Frequency  1x / week    PT Duration  12  weeks    PT Treatment/Interventions  Neuromuscular re-education;Manual techniques;Patient/family education;Therapeutic exercise;Therapeutic activities;Functional mobility training;Moist Heat;Scar mobilization;Manual lymph drainage;Electrical Stimulation;Biofeedback    Consulted and Agree with Plan of Care  Patient;Family member/caregiver       Patient will benefit from skilled therapeutic intervention in order to improve the following deficits and impairments:  Postural dysfunction, Improper body mechanics, Increased muscle spasms, Difficulty walking, Decreased safety awareness, Decreased endurance, Decreased range of motion, Impaired flexibility, Decreased strength, Decreased mobility, Decreased coordination, Cardiopulmonary status limiting activity, Abnormal gait, Decreased balance, Decreased activity tolerance, Hypomobility  Visit Diagnosis: Other lack of coordination  Other symptoms and signs involving the musculoskeletal system     Problem List Patient Active Problem List   Diagnosis Date Noted  . Malignant neoplastic disease ([place]) 04/28/2015  . Hypercholesterolemia 10/15/2014  . Essential (primary) hypertension 06/02/2014  . Acute blood loss anemia 01/25/2013  . Bloody pleural effusion 01/25/2013  . Pain following surgery or procedure 01/25/2013  . H/O aortic valve replacement 01/25/2013  . H/O coronary artery bypass surgery 01/25/2013  . History of open heart surgery 01/25/2013  . Angina, class II ([place]) 01/22/2013  . Heart failure ([place]) 01/22/2013  . 3-vessel CAD 01/17/2013  . Aortic heart valve narrowing 01/17/2013    Jerl Mina ,PT, DPT, E-RYT  12/20/2017, 10:01 AM  [place] MAIN [facility] SERVICES [address] [place], [place], [address] Phone: [phone]   Fax:  [phone]  Name: Bradley Nelson MRN: [id number] Date of Birth: 1932-04-13

## 2017-12-20 NOTE — Patient Instructions (Signed)
kegels 5 reps , seated x 5 x day ( breakfast, midmorning, lunch, mid afternoon, and dinner)  Remember to sit tall, feet on the ground, hands push by your side on the chair , chest lifts up   ___  Two stretches for the L neck and shoulder   Standing perpendicular to the door, L forearm. 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

## 2017-12-25 DIAGNOSIS — H903 Sensorineural hearing loss, bilateral: Secondary | ICD-10-CM | POA: Diagnosis not present

## 2017-12-27 ENCOUNTER — Ambulatory Visit: Payer: PPO | Admitting: Physical Therapy

## 2017-12-27 DIAGNOSIS — R278 Other lack of coordination: Secondary | ICD-10-CM

## 2017-12-27 DIAGNOSIS — R29898 Other symptoms and signs involving the musculoskeletal system: Secondary | ICD-10-CM

## 2017-12-27 NOTE — Patient Instructions (Addendum)
Marching with tapping on thigh ( raising thigh higher) place fet down hip width apart  2 min x 2 day   Pelvic floor squeezes 10 reps with shoulder squeezes  X 5x day (beakfast, mid morning, lunch, midafternoon, and dinner)   Quick pelvic floor squeeze with sit to stand.   ______  Avoid working outside in the middle of the day to avoid overheating And to minimize strain on the heart

## 2017-12-27 NOTE — Therapy (Signed)
[place] MAIN [facility] SERVICES [address] [place], [place], [address] Phone: [phone]   Fax:  [phone]  Physical Therapy Treatment  Patient Details  Name: Bradley Nelson MRN: [id number] Date of Birth: 11-May-1932 Referring Provider: Erlene Quan    Encounter Date: 12/27/2017  PT End of Session - 12/27/17 0949    Visit Number  4    Number of Visits  12    Date for PT Re-Evaluation  02/27/18    PT Start Time  0915    PT Stop Time  0955    PT Time Calculation (min)  40 min       Past Medical History:  Diagnosis Date  . Acute blood loss anemia 01/25/2013  . Angina, class II ([place]) 01/22/2013  . Aortic heart valve narrowing 01/17/2013  . Bloody pleural effusion 01/25/2013  . Essential (primary) hypertension 06/02/2014  . H/O aortic valve replacement 01/25/2013  . H/O coronary artery bypass surgery 01/25/2013   Overview:  cabg x3 with LIMA to LAD, SVG to PDA, SVG to left circumflex with septal muomectomy and aortic valve replacement with Ce Magna bioprosthetic valve on 01-24-13 by [name] at [facility].   . Heart failure ([place]) 01/22/2013  . History of open heart surgery 01/25/2013  . Hypercholesterolemia 10/15/2014  . Malignant neoplastic disease ([place]) 04/28/2015   Overview:  prostate cancer, s/p prostatectomy    . Prostate cancer ([place])   . Skin cancer     Past Surgical History:  Procedure Laterality Date  . AORTIC VALVE REPLACEMENT    . CARDIAC SURGERY    . CORONARY ARTERY BYPASS GRAFT    . HERNIA REPAIR    . PROSTATECTOMY  2001   Dr. [name]    There were no vitals filed for this visit.  Subjective Assessment - 12/27/17 0915    Subjective  Pt notices his leakage is less and he is able to wear the pads a little longer . Pt has not drank tea but once this week    Patient is accompained by:  Family member wife     Pertinent History  Hx of multiple cardiac conditions and surgery , skin CA, prostate CA , hernia repair x 2  when prostate was  removed in 2001, leg cramps in the morning, dizziness since heart surgery 5 years ago which his MD is aware of,     Patient Stated Goals  to slow down the leakage          [facility] PT Assessment - 12/27/17 0946      Observation/Other Assessments   Observations  upright posture , good carry                    [facility] Adult PT Treatment/Exercise - 12/27/17 0946      Neuro Re-ed    Neuro Re-ed Details   see pt instructions       parallel bars x 2 min x 2 with marching and cue for higher thighs, wider feet   seated marching        PT Education - 12/27/17 0949    Education provided  Yes    Education Details  HEP    Person(s) Educated  Patient    Methods  Explanation;Demonstration;Tactile cues;Verbal cues;Handout    Comprehension  Returned demonstration;Verbalized understanding          PT Long Term Goals - 12/04/17 0931      PT LONG TERM GOAL #1   Title  Decrease diapers from 2-3 x day to < 2 x day in order to improve QOL     Time  12    Period  Weeks    Status  New    Target Date  02/26/18      PT LONG TERM GOAL #2   Title  Pt will be referred for a sleep study     Time  2    Period  Weeks    Status  New      PT LONG TERM GOAL #3   Title  Pt will demo no breathholding/ proper technique with Sit to stand, and decreased time with 5 TST from 19.62  sec to < 13 sec in order to minimzie risks for falls      Time  8    Period  Weeks    Status  New      PT LONG TERM GOAL #4   Title  Pt will demo levelled pelvis in stance and supine with compliance to shoe lift     Time  2    Period  Weeks    Status  New    Target Date  12/19/17      PT LONG TERM GOAL #5   Title  Pt will decrease his IPSS score from 15 pts (moderate impairment)  to < 10 pts ( less moderate) or QOL score from 5 ( unhappy) to < 3 pts ( mixed)  in order to improve QOL    Time  10    Period  Weeks    Status  New    Target Date  02/13/18      Additional Long Term Goals   Additional  Long Term Goals  Yes      PT LONG TERM GOAL #6   Title  PT will communicate with MDs about referral to sleep study to screen for OSA 2/2 to his cardiac, sedentary , and nocturia risk factors in order to minimize health issues    Time  4    Period  Weeks    Status  New    Target Date  01/02/18      PT LONG TERM GOAL #7   Title  Pt will demo decreased cardiac and supic scar restrictions in order to elicit increased diaphragmatic/ pelvic floor coordination for pelvic floor strengthening     Time  8    Period  Weeks    Status  New    Target Date  01/30/18      PT LONG TERM GOAL #8   Title  Pt will increase gait from 0.73 m/s to > 1/0 m/s in order to minimize risk for falls    Time  10    Period  Weeks    Status  New    Target Date  02/13/18            Plan - 12/27/17 0950    Clinical Impression Statement Pt is making progress as he reported less leakage and being able to wear his diapers longer. Today, progressed pt to co -activation of pelvic floor and scapular retraction to promote upright posture and coactivation with pelvic floor with sit to stand. Also added gait training to increase hip flexion and feet propioception inside parallel bars 2 min x 2 sets to promote hip ROM and balance. Pt demo'd correctly. Pt continues to benefit from skilled PT. Decreasing frequency to 2x month to allow for pt to maintain strengthening routine.  Rehab Potential  Good    PT Frequency  1x / week    PT Duration  12 weeks    PT Treatment/Interventions  Neuromuscular re-education;Manual techniques;Patient/family education;Therapeutic exercise;Therapeutic activities;Functional mobility training;Moist Heat;Scar mobilization;Manual lymph drainage;Electrical Stimulation;Biofeedback    Consulted and Agree with Plan of Care  Patient;Family member/caregiver       Patient will benefit from skilled therapeutic intervention in order to improve the following deficits and impairments:  Postural dysfunction,  Improper body mechanics, Increased muscle spasms, Difficulty walking, Decreased safety awareness, Decreased endurance, Decreased range of motion, Impaired flexibility, Decreased strength, Decreased mobility, Decreased coordination, Cardiopulmonary status limiting activity, Abnormal gait, Decreased balance, Decreased activity tolerance, Hypomobility  Visit Diagnosis: Other symptoms and signs involving the musculoskeletal system  Other lack of coordination     Problem List Patient Active Problem List   Diagnosis Date Noted  . Malignant neoplastic disease ([place]) 04/28/2015  . Hypercholesterolemia 10/15/2014  . Essential (primary) hypertension 06/02/2014  . Acute blood loss anemia 01/25/2013  . Bloody pleural effusion 01/25/2013  . Pain following surgery or procedure 01/25/2013  . H/O aortic valve replacement 01/25/2013  . H/O coronary artery bypass surgery 01/25/2013  . History of open heart surgery 01/25/2013  . Angina, class II ([place]) 01/22/2013  . Heart failure ([place]) 01/22/2013  . 3-vessel CAD 01/17/2013  . Aortic heart valve narrowing 01/17/2013    Jerl Mina ,PT, DPT, E-RYT  12/27/2017, 11:32 AM  [place] MAIN [facility] SERVICES [address] [place], [place], [address] Phone: [phone]   Fax:  [phone]  Name: Bradley Nelson MRN: [id number] Date of Birth: 1931/09/01

## 2018-01-02 ENCOUNTER — Encounter: Payer: PPO | Admitting: Physical Therapy

## 2018-01-10 ENCOUNTER — Ambulatory Visit: Payer: PPO | Attending: Urology | Admitting: Physical Therapy

## 2018-01-10 DIAGNOSIS — R29898 Other symptoms and signs involving the musculoskeletal system: Secondary | ICD-10-CM | POA: Diagnosis not present

## 2018-01-10 DIAGNOSIS — R278 Other lack of coordination: Secondary | ICD-10-CM | POA: Diagnosis not present

## 2018-01-10 NOTE — Therapy (Addendum)
[place] MAIN [facility] SERVICES [address] [place], [place], [address] Phone: [phone]   Fax:  [phone]  Physical Therapy Treatment  Patient Details  Name: Bradley Nelson MRN: [id number] Date of Birth: 06-15-1932 Referring Provider: Erlene Quan    Encounter Date: 01/10/2018    Past Medical History:  Diagnosis Date  . Acute blood loss anemia 01/25/2013  . Angina, class II ([place]) 01/22/2013  . Aortic heart valve narrowing 01/17/2013  . Bloody pleural effusion 01/25/2013  . Essential (primary) hypertension 06/02/2014  . H/O aortic valve replacement 01/25/2013  . H/O coronary artery bypass surgery 01/25/2013   Overview:  cabg x3 with LIMA to LAD, SVG to PDA, SVG to left circumflex with septal muomectomy and aortic valve replacement with Ce Magna bioprosthetic valve on 01-24-13 by [name] at [facility].   . Heart failure ([place]) 01/22/2013  . History of open heart surgery 01/25/2013  . Hypercholesterolemia 10/15/2014  . Malignant neoplastic disease ([place]) 04/28/2015   Overview:  prostate cancer, s/p prostatectomy    . Prostate cancer ([place])   . Skin cancer     Past Surgical History:  Procedure Laterality Date  . AORTIC VALVE REPLACEMENT    . CARDIAC SURGERY    . CORONARY ARTERY BYPASS GRAFT    . HERNIA REPAIR    . PROSTATECTOMY  2001   Dr. [name]    There were no vitals filed for this visit.  Subjective Assessment - 01/10/18 0814    Subjective  Pt is doing the pelvic squeezes more than he does the other exercises. Pt has noticed the leakage has slowed down but it is still there .  One day when he did not have to go up and down from the ground, he went through 2 small pads instead of one diaper and 2 small pads.  Pt notices more leakage with up and down on his knees when fixing his hay baler.  Pt has cut down on coffee and also soft drinks     Patient is accompained by:  Family member wife     Pertinent History  Hx of multiple cardiac conditions and  surgery , skin CA, prostate CA , hernia repair x 2  when prostate was removed in 2001, leg cramps in the morning, dizziness since heart surgery 5 years ago which his MD is aware of,     Patient Stated Goals  to slow down the leakage          [facility] PT Assessment - 01/10/18 0823      Observation/Other Assessments   Observations  signficantly less forward head position      Floor to Stand   Comments  excessive breathholding and pulling onto furniture for assistance lowering and getting up       Palpation   Spinal mobility  decreased tightenss along L medial scapula                    [facility] Adult PT Treatment/Exercise - 01/10/18 2316      Therapeutic Activites    Therapeutic Activities  -- see pt instructions      Neuro Re-ed    Neuro Re-ed Details   see pt instructions                  PT Long Term Goals - 01/10/18 0842      PT LONG TERM GOAL #1   Title  Decrease diapers from 2-3 x day to < 2 x  day in order to improve QOL     Time  12    Period  Weeks    Status  Achieved      PT LONG TERM GOAL #2   Title  Pt will be referred for a sleep study     Time  2    Period  Weeks    Status  On-going      PT LONG TERM GOAL #3   Title  Pt will demo no breathholding/ proper technique with Sit to stand, and decreased time with 5 TST from 19.62  sec to < 18 sec in order to minimzie risks for falls      Baseline  6/5: 18:04 sec    Time  8    Period  Weeks    Status  Revised      PT LONG TERM GOAL #4   Title  Pt will demo levelled pelvis in stance and supine with compliance to shoe lift     Time  2    Period  Weeks    Status  Achieved      PT LONG TERM GOAL #5   Title  Pt will decrease his IPSS score from 15 pts (moderate impairment)  to < 10 pts ( less moderate) or QOL score from 5 ( unhappy) to < 3 pts ( mixed)  in order to improve QOL    Time  10    Period  Weeks      Additional Long Term Goals   Additional Long Term Goals  Yes      PT LONG TERM  GOAL #6   Title  PT will communicate with MDs about referral to sleep study to screen for OSA 2/2 to his cardiac, sedentary , and nocturia risk factors in order to minimize health issues    Time  4    Period  Weeks    Status  New      PT LONG TERM GOAL #7   Title  Pt will demo decreased cardiac and supic scar restrictions in order to elicit increased diaphragmatic/ pelvic floor coordination for pelvic floor strengthening     Time  8    Period  Weeks    Status  New      PT LONG TERM GOAL #8   Title  Pt will increase gait from 0.73 m/s to > 1/0 m/s in order to minimize risk for falls    Time  10    Period  Weeks    Status  New      PT LONG TERM GOAL  #9   TITLE  Pt will report decreased urinary pad wear from 2 x day to 1 x day and less leakage with up and down from the ground <> standing activities in order to work on his hay baler and mowers     Time  8    Period  Weeks    Status  New    Target Date  03/07/18            Plan - 01/10/18 2313    Clinical Impression Statement  Pt continues to experience decreased urinary leakage as he reports he has decreased from  1 diaper and 2 urinary pads to only 2 urinary pads. Pt's urinary leakage occurs with increased activiteis and getting up and down from the ground. Addressed stand<> floor transfers today which pt demo'd correctly with less strain on his pelvic floor after training. Emphasized techniques to minimize  orthostatic hypotension given his cardiac issues.Pt remains compliant with decreasing coffee and sodas as well. Anticipate pt will continue to advance towards his goals with skilled PT.     Rehab Potential  Good    PT Frequency  1x / week    PT Duration  12 weeks    PT Treatment/Interventions  Neuromuscular re-education;Manual techniques;Patient/family education;Therapeutic exercise;Therapeutic activities;Functional mobility training;Moist Heat;Scar mobilization;Manual lymph drainage;Electrical Stimulation;Biofeedback     Consulted and Agree with Plan of Care  Patient;Family member/caregiver       Patient will benefit from skilled therapeutic intervention in order to improve the following deficits and impairments:  Postural dysfunction, Improper body mechanics, Increased muscle spasms, Difficulty walking, Decreased safety awareness, Decreased endurance, Decreased range of motion, Impaired flexibility, Decreased strength, Decreased mobility, Decreased coordination, Cardiopulmonary status limiting activity, Abnormal gait, Decreased balance, Decreased activity tolerance, Hypomobility  Visit Diagnosis: Other lack of coordination  Other symptoms and signs involving the musculoskeletal system     Problem List Patient Active Problem List   Diagnosis Date Noted  . Malignant neoplastic disease ([place]) 04/28/2015  . Hypercholesterolemia 10/15/2014  . Essential (primary) hypertension 06/02/2014  . Acute blood loss anemia 01/25/2013  . Bloody pleural effusion 01/25/2013  . Pain following surgery or procedure 01/25/2013  . H/O aortic valve replacement 01/25/2013  . H/O coronary artery bypass surgery 01/25/2013  . History of open heart surgery 01/25/2013  . Angina, class II ([place]) 01/22/2013  . Heart failure ([place]) 01/22/2013  . 3-vessel CAD 01/17/2013  . Aortic heart valve narrowing 01/17/2013    Jerl Mina ,PT, DPT, E-RYT  01/10/2018, 11:17 PM  [place] MAIN [facility] SERVICES [address] [place], [place], [address] Phone: [phone]   Fax:  [phone]  Name: Bradley Nelson MRN: [id number] Date of Birth: 05-26-1932

## 2018-01-10 NOTE — Patient Instructions (Addendum)
            Transition from standing to floor: Wide squat like you are about to pick something up from the floor --> hands on the ground (all fours)  To get up, log roll to sidelying that move into all fours--> lifts hips in to Downward Facing Dog  and walk hands backwards to feet --> mini quat --> hands on thighs, then hips then pause to avoid (moving too quickly up/ blood rush) -->  knees glide forward and roll  Hips up instead of hinging spine up  ________   Spine stretches: 3 x each  Side to side with arms overhead  Turn left and right   "football player" -(minisquat)  And then rise up with chest lift up , chin up to arch the mid back   ________  Door way shoulder and chin press with quick pelvic floor squeeze as you press shoudlers and chin back  5 x   Arm slide against the door way   ____________   Continue with pelvic floor squeezes

## 2018-01-15 ENCOUNTER — Encounter: Payer: PPO | Admitting: Physical Therapy

## 2018-01-18 ENCOUNTER — Encounter: Payer: PPO | Admitting: Physical Therapy

## 2018-01-23 ENCOUNTER — Ambulatory Visit: Payer: PPO | Admitting: Physical Therapy

## 2018-01-23 DIAGNOSIS — R29898 Other symptoms and signs involving the musculoskeletal system: Secondary | ICD-10-CM

## 2018-01-23 DIAGNOSIS — R278 Other lack of coordination: Secondary | ICD-10-CM

## 2018-01-23 NOTE — Therapy (Signed)
[place] MAIN [facility] SERVICES [address] [place], [place], [address] Phone: [phone]   Fax:  [phone]  Physical Therapy Treatment  Patient Details  Name: Bradley Nelson MRN: [id number] Date of Birth: 03/31/1932 Referring Provider: Erlene Quan    Encounter Date: 01/23/2018  PT End of Session - 01/23/18 0953    Visit Number  5    Number of Visits  12    Date for PT Re-Evaluation  02/27/18    PT Start Time  0910    PT Stop Time  1010    PT Time Calculation (min)  60 min       Past Medical History:  Diagnosis Date  . Acute blood loss anemia 01/25/2013  . Angina, class II ([place]) 01/22/2013  . Aortic heart valve narrowing 01/17/2013  . Bloody pleural effusion 01/25/2013  . Essential (primary) hypertension 06/02/2014  . H/O aortic valve replacement 01/25/2013  . H/O coronary artery bypass surgery 01/25/2013   Overview:  cabg x3 with LIMA to LAD, SVG to PDA, SVG to left circumflex with septal muomectomy and aortic valve replacement with Ce Magna bioprosthetic valve on 01-24-13 by [name] at [facility].   . Heart failure ([place]) 01/22/2013  . History of open heart surgery 01/25/2013  . Hypercholesterolemia 10/15/2014  . Malignant neoplastic disease ([place]) 04/28/2015   Overview:  prostate cancer, s/p prostatectomy    . Prostate cancer ([place])   . Skin cancer     Past Surgical History:  Procedure Laterality Date  . AORTIC VALVE REPLACEMENT    . CARDIAC SURGERY    . CORONARY ARTERY BYPASS GRAFT    . HERNIA REPAIR    . PROSTATECTOMY  2001   Dr. [name]    There were no vitals filed for this visit.  Subjective Assessment - 01/23/18 0915    Subjective  Pt reported he hurt his L lower back when fixing the mowing machine for hay. Pt felt some pain in the low back when doing his pelvic floor exercises.  Pt has noticed his leakage " has slowed down a bit" which getting up and down from the ground to standing.      Patient is accompained by:  Family member wife      Pertinent History  Hx of multiple cardiac conditions and surgery , skin CA, prostate CA , hernia repair x 2  when prostate was removed in 2001, leg cramps in the morning, dizziness since heart surgery 5 years ago which his MD is aware of,     Patient Stated Goals  to slow down the leakage          [facility] PT Assessment - 01/23/18 0918      Coordination   Gross Motor Movements are Fluid and Coordinated  -- dyscoordination , lift of PFM on inhalation      ROM / Strength   AROM / PROM / Strength  -- L hip IR limited ~5 deg compared to 10 deg R       AROM   Overall AROM Comments  125 deg hip flexion with pain, sidebend 25% R w/ pain at R SIJ, rotation ~30%                    [facility] Adult PT Treatment/Exercise - 01/23/18 0949      Neuro Re-ed    Neuro Re-ed Details   see pt instructions      Manual Therapy   Manual therapy comments  long  distraction on LLE, AP m ob grade II FADDIR, hip flexion.   rotational mob.  STM along L QL / paraspinal              PT Education - 01/23/18 0952    Education provided  Yes    Education Details  HEP    Person(s) Educated  Patient    Methods  Explanation;Demonstration;Tactile cues;Verbal cues;Handout    Comprehension  Verbalized understanding;Returned demonstration          PT Long Term Goals - 01/10/18 0842      PT LONG TERM GOAL #1   Title  Decrease diapers from 2-3 x day to < 2 x day in order to improve QOL     Time  12    Period  Weeks    Status  Achieved      PT LONG TERM GOAL #2   Title  Pt will be referred for a sleep study     Time  2    Period  Weeks    Status  On-going      PT LONG TERM GOAL #3   Title  Pt will demo no breathholding/ proper technique with Sit to stand, and decreased time with 5 TST from 19.62  sec to < 18 sec in order to minimzie risks for falls      Baseline  6/5: 18:04 sec    Time  8    Period  Weeks    Status  Revised      PT LONG TERM GOAL #4   Title  Pt will demo levelled  pelvis in stance and supine with compliance to shoe lift     Time  2    Period  Weeks    Status  Achieved      PT LONG TERM GOAL #5   Title  Pt will decrease his IPSS score from 15 pts (moderate impairment)  to < 10 pts ( less moderate) or QOL score from 5 ( unhappy) to < 3 pts ( mixed)  in order to improve QOL    Time  10    Period  Weeks      Additional Long Term Goals   Additional Long Term Goals  Yes      PT LONG TERM GOAL #6   Title  PT will communicate with MDs about referral to sleep study to screen for OSA 2/2 to his cardiac, sedentary , and nocturia risk factors in order to minimize health issues    Time  4    Period  Weeks    Status  New      PT LONG TERM GOAL #7   Title  Pt will demo decreased cardiac and supic scar restrictions in order to elicit increased diaphragmatic/ pelvic floor coordination for pelvic floor strengthening     Time  8    Period  Weeks    Status  New      PT LONG TERM GOAL #8   Title  Pt will increase gait from 0.73 m/s to > 1/0 m/s in order to minimize risk for falls    Time  10    Period  Weeks    Status  New      PT LONG TERM GOAL  #9   TITLE  Pt will report decreased urinary pad wear from 2 x day to 1 x day and less leakage with up and down from the ground <> standing activities in order to work  on his hay baler and mowers     Time  8    Period  Weeks    Status  New    Target Date  03/07/18            Plan - 01/23/18 0955    Clinical Impression Statement  Pt is making progress with less leakage. Pt reported LBP this week due to muscle strain but after Tx today, it improved in addition to his spinal ROM , decreased paraspinal/ QL mm tightness on L, and increased L SIJ mobility.  Pt advanced to endurance pelvic floor training today but required cues to correct dyscoordination of his pelvic floor mm.   Pt has reported he feels SOB and running out of breath when singing at church and talking.  Pt has a Hx of cardiac surgery  and was put on  a ventilator during that surgery. Pt reports he has felt his voice is not as loud after this surgery 4 years ago.  PT discussed with pt about a referral to speech therapy and the cardiac rehab program to improve his speech and cardiac endurance. Pt voiced he will have to think about it. Pt was provided information about these services. PT explained if he would like to process, PT can communicate with his providers for referrals to these services. Pt continues to benefit from skilled PT      Rehab Potential  Good    PT Frequency  1x / week    PT Duration  12 weeks    PT Treatment/Interventions  Neuromuscular re-education;Manual techniques;Patient/family education;Therapeutic exercise;Therapeutic activities;Functional mobility training;Moist Heat;Scar mobilization;Manual lymph drainage;Electrical Stimulation;Biofeedback    Consulted and Agree with Plan of Care  Patient;Family member/caregiver       Patient will benefit from skilled therapeutic intervention in order to improve the following deficits and impairments:  Postural dysfunction, Improper body mechanics, Increased muscle spasms, Difficulty walking, Decreased safety awareness, Decreased endurance, Decreased range of motion, Impaired flexibility, Decreased strength, Decreased mobility, Decreased coordination, Cardiopulmonary status limiting activity, Abnormal gait, Decreased balance, Decreased activity tolerance, Hypomobility  Visit Diagnosis: Other lack of coordination  Other symptoms and signs involving the musculoskeletal system     Problem List Patient Active Problem List   Diagnosis Date Noted  . Malignant neoplastic disease ([place]) 04/28/2015  . Hypercholesterolemia 10/15/2014  . Essential (primary) hypertension 06/02/2014  . Acute blood loss anemia 01/25/2013  . Bloody pleural effusion 01/25/2013  . Pain following surgery or procedure 01/25/2013  . H/O aortic valve replacement 01/25/2013  . H/O coronary artery bypass surgery  01/25/2013  . History of open heart surgery 01/25/2013  . Angina, class II ([place]) 01/22/2013  . Heart failure ([place]) 01/22/2013  . 3-vessel CAD 01/17/2013  . Aortic heart valve narrowing 01/17/2013    Jerl Mina ,PT, DPT, E-RYT  01/23/2018, 10:11 AM  [place] MAIN [facility] SERVICES [address] [place], [place], [address] Phone: [phone]   Fax:  [phone]  Name: Bradley Nelson MRN: [id number] Date of Birth: 09-10-1931

## 2018-01-23 NOTE — Patient Instructions (Addendum)
PELVIC FLOOR / KEGEL EXERCISES   Pelvic floor/ Kegel exercises are used to strengthen the muscles in the base of your pelvis that are responsible for supporting your pelvic organs and preventing urine/feces leakage. Based on your therapist's recommendations, they can be performed while standing, sitting, or lying down.  Make yourself aware of this muscle group by using these cues:  Imagine you are in a crowded room and you feel the need to pass gas. Your response is to pull up and in at the rectum.  Close the rectum. Pull the muscles up inside your body,feeling your scrotum lifting as well . Feel the pelvic floor muscles lift as if you were walking into a cold lake.  Place your hand on top of your pubic bone. Tighten and draw in the muscles around the anal muscles without squeezing the buttock muscles.  Common Errors:  Breath holding: If you are holding your breath, you may be bearing down against your bladder instead of pulling it up. If you belly bulges up while you are squeezing, you are holding your breath. Be sure to breathe gently in and out while exercising. Counting out loud may help you avoid holding your breath.  Accessory muscle use: You should not see or feel other muscle movement when performing pelvic floor exercises. When done properly, no one can tell that you are performing the exercises. Keep the buttocks, belly and inner thighs relaxed.  Overdoing it: Your muscles can fatigue and stop working for you if you over-exercise. You may actually leak more or feel soreness at the lower abdomen or rectum.  YOUR HOME EXERCISE PROGRAM  LONG HOLDS: Position: on back, sidelying    Inhale and then exhale. Then squeeze the muscle and count aloud for 5 seconds. Rest with three long breaths. (Be sure to let belly sink in with exhales and not push outward)  Perform 5 repetitions, 3 times/day   **ALSO SQUEEZE BEFORE YOUR SNEEZE, COUGH, LAUGH to decrease downward pressure   **ALSO  EXHALE BEFORE YOU RISE AGAINST GRAVITY (lifting, sit to stand, from squat to stand)

## 2018-01-29 ENCOUNTER — Ambulatory Visit: Payer: PPO | Admitting: Physical Therapy

## 2018-01-29 DIAGNOSIS — R29898 Other symptoms and signs involving the musculoskeletal system: Secondary | ICD-10-CM

## 2018-01-29 DIAGNOSIS — R278 Other lack of coordination: Secondary | ICD-10-CM

## 2018-01-29 NOTE — Therapy (Signed)
[place] MAIN [facility] SERVICES [address] [place], [place], [address] Phone: [phone]   Fax:  [phone]  Physical Therapy Treatment / Discharge Summary   Patient Details  Name: Bradley Nelson MRN: [id number] Date of Birth: [birth year] Referring Provider: Erlene Quan     Encounter Date: 01/29/2018  PT End of Session - 01/29/18 1055    Visit Number  6    Number of Visits  12    Date for PT Re-Evaluation  02/27/18    PT Start Time  0947    PT Stop Time  1015    PT Time Calculation (min)  28 min    Activity Tolerance  Patient tolerated treatment well    Behavior During Therapy  [facility] for tasks assessed/performed       Past Medical History:  Diagnosis Date  . Acute blood loss anemia 01/25/2013  . Angina, class II ([place]) 01/22/2013  . Aortic heart valve narrowing 01/17/2013  . Bloody pleural effusion 01/25/2013  . Essential (primary) hypertension 06/02/2014  . H/O aortic valve replacement 01/25/2013  . H/O coronary artery bypass surgery 01/25/2013   Overview:  cabg x3 with LIMA to LAD, SVG to PDA, SVG to left circumflex with septal muomectomy and aortic valve replacement with Ce Magna bioprosthetic valve on 01-24-13 by [name] at [facility].   . Heart failure ([place]) 01/22/2013  . History of open heart surgery 01/25/2013  . Hypercholesterolemia 10/15/2014  . Malignant neoplastic disease ([place]) 04/28/2015   Overview:  prostate cancer, s/p prostatectomy    . Prostate cancer ([place])   . Skin cancer     Past Surgical History:  Procedure Laterality Date  . AORTIC VALVE REPLACEMENT    . CARDIAC SURGERY    . CORONARY ARTERY BYPASS GRAFT    . HERNIA REPAIR    . PROSTATECTOMY  2001   Dr. [name]    There were no vitals filed for this visit.      [facility] PT Assessment - 01/29/18 1056      Observation/Other Assessments   Observations  pt tearful when speaking about his concern for wife's medical issues       Posture/Postural Control   Posture Comments   upright posture , less slumped      Ambulation/Gait   Gait Comments  0.9 m/s               Treatment:  reassessed goals  discussion about topics ( see pt instructions)                   PT Long Term Goals - 01/29/18 0959      PT LONG TERM GOAL #1   Title  Decrease diapers from 2-3 x day to < 2 x day in order to improve QOL     Time  12    Period  Weeks    Status  Achieved      PT LONG TERM GOAL #2   Title  PT will communicate with MDs about referral to sleep study to screen for OSA 2/2 to his cardiac, sedentary , and nocturia risk factors in order to minimize health issues    Time  4    Period  Weeks    Status  Achieved      PT LONG TERM GOAL #3   Title  Pt will demo no breathholding/ proper technique with Sit to stand, and decreased time with 5 TST from 19.62  sec to < 18 sec  in order to minimzie risks for falls      Baseline  6/5: 18:04 sec    Time  8    Period  Weeks    Status  Achieved      PT LONG TERM GOAL #4   Title  Pt will demo levelled pelvis in stance and supine with compliance to shoe lift     Time  2    Period  Weeks    Status  Achieved      PT LONG TERM GOAL #5   Title  Pt will decrease his IPSS score from 15 pts (moderate impairment)  to < 10 pts ( less moderate) or QOL score from 5 ( unhappy) to < 3 pts ( mixed)  in order to improve QOL  (6/24: 10 pts, QOL 3pts)     Time  10    Period  Weeks    Status  Achieved      PT LONG TERM GOAL #6   Title  PT will communicate with MDs about referral to sleep study to screen for OSA 2/2 to his cardiac, sedentary , and nocturia risk factors in order to minimize health issues    Time  4   Period Weeks   Status Achieved     PT LONG TERM GOAL #7   Title  Pt will demo decreased cardiac and supic scar restrictions in order to elicit increased diaphragmatic/ pelvic floor coordination for pelvic floor strengthening     Time  8    Period  Weeks    Status  Achieved      PT LONG TERM GOAL #8    Title  Pt will increase gait from 0.73 m/s to > 1/0 m/s in order to minimize risk for falls  (6/24: .9 m/s)     Time  10    Period  Weeks    Status  Partially Met      PT LONG TERM GOAL  #9   TITLE  Pt will report decreased urinary pad wear from 2 x day to 1 x day and less leakage with up and down from the ground <> standing activities in order to work on his hay baler and mowers     Time  8    Period  Weeks    Status  Achieved            Plan - 01/29/18 1105    Clinical Impression Statement Pt has achieved 8/9 goals across the past 6 sessions. Pt's IPSS score has decreased from 15 pts to 10 pts which indicates improved function. His QOL score on IPSS decreased form 5 pts to 3 pts. Pt has decreased his use of pads, improved pelvic floor coordination and strength, and demonstrates improved body mechanics with less strain with floor <> stand and out of bed transfers to minimize SUI. Pt also showed less supra pubic and sternal scar restrictions which has facilitated improved diaphragmatic excursion/ pelvic floor coordination and more upright posture/ less thoracic kyphosis. Pt also increased his gait speed. Pt is ready for d/c today.   Maintenance education: Pt was recommended fitness programs offered at the hospital to maintain cardiac health given his report of SOB and cardiac surgery but pt declined due to the travel and his other priorities. Pt was advised to buy a low cost portable cycle that he can place under a chair to use for aerobic health. Pt was printed a handout of this machine.  Pt was also educated  that nocturia may be associated with OSA and he would benefit from getting screened for OSA given his increased risks with cardiac conditions/ surgeries. Pt declined following up with this recommendation with his doctors at this time due to his concern with his wife's medical issues. PT plans to communicate with his referring provider re: this recommendation. Pt voiced understanding the  importance of maintaining pelvic floor exercises to yield further gains.   Pt was tearful when expressing about his wife's issues. Pt was provided active listening along with further handouts/ resources re: speech therapy for memory rehab and [facility] services as he and his wife expressed her memory loss after she hit her head during a fall.        Rehab Potential  Good    PT Frequency  1x / week    PT Duration  12 weeks    PT Treatment/Interventions  Neuromuscular re-education;Manual techniques;Patient/family education;Therapeutic exercise;Therapeutic activities;Functional mobility training;Moist Heat;Scar mobilization;Manual lymph drainage;Electrical Stimulation;Biofeedback    Consulted and Agree with Plan of Care  Patient;Family member/caregiver       Patient will benefit from skilled therapeutic intervention in order to improve the following deficits and impairments:  Postural dysfunction, Improper body mechanics, Increased muscle spasms, Difficulty walking, Decreased safety awareness, Decreased endurance, Decreased range of motion, Impaired flexibility, Decreased strength, Decreased mobility, Decreased coordination, Cardiopulmonary status limiting activity, Abnormal gait, Decreased balance, Decreased activity tolerance, Hypomobility  Visit Diagnosis: Other lack of coordination  Other symptoms and signs involving the musculoskeletal system     Problem List Patient Active Problem List   Diagnosis Date Noted  . Malignant neoplastic disease ([place]) 04/28/2015  . Hypercholesterolemia 10/15/2014  . Essential (primary) hypertension 06/02/2014  . Acute blood loss anemia 01/25/2013  . Bloody pleural effusion 01/25/2013  . Pain following surgery or procedure 01/25/2013  . H/O aortic valve replacement 01/25/2013  . H/O coronary artery bypass surgery 01/25/2013  . History of open heart surgery 01/25/2013  . Angina, class II ([place]) 01/22/2013  . Heart failure ([place]) 01/22/2013  . 3-vessel  CAD 01/17/2013  . Aortic heart valve narrowing 01/17/2013    Jerl Mina ,PT, DPT, E-RYT  01/29/2018, 11:05 AM  [place] MAIN [facility] SERVICES [address] [place], [place], [address] Phone: [phone]   Fax:  [phone]  Name: Bradley Nelson MRN: [id number] Date of Birth: 26-Sep-1931

## 2018-01-29 NOTE — Patient Instructions (Signed)
    1) Recommended cardiovascular health  10 min x 3 x day  [email or web address].[email or web address]  2) Important to maintain exercises.   3) Important to get screened for sleep apnea ( association with nocturia and cardiovascular conditions)   4) Resources as a caregiver for wife: Building control surveyor Therapy services for memory

## 2018-02-03 DIAGNOSIS — B9689 Other specified bacterial agents as the cause of diseases classified elsewhere: Secondary | ICD-10-CM | POA: Diagnosis not present

## 2018-02-03 DIAGNOSIS — J40 Bronchitis, not specified as acute or chronic: Secondary | ICD-10-CM | POA: Diagnosis not present

## 2018-02-03 DIAGNOSIS — J019 Acute sinusitis, unspecified: Secondary | ICD-10-CM | POA: Diagnosis not present

## 2018-02-06 DIAGNOSIS — R05 Cough: Secondary | ICD-10-CM | POA: Diagnosis not present

## 2018-02-06 DIAGNOSIS — J40 Bronchitis, not specified as acute or chronic: Secondary | ICD-10-CM | POA: Diagnosis not present

## 2018-02-07 ENCOUNTER — Emergency Department
Admission: EM | Admit: 2018-02-07 | Discharge: 2018-02-07 | Disposition: A | Discharge status: Home / Self Care | Payer: PPO | Attending: Student in an Organized Health Care Education/Training Program | Admitting: Student in an Organized Health Care Education/Training Program

## 2018-02-07 ENCOUNTER — Encounter: Payer: Self-pay | Admitting: Emergency Medicine

## 2018-02-07 ENCOUNTER — Other Ambulatory Visit: Payer: Self-pay

## 2018-02-07 DIAGNOSIS — Z951 Presence of aortocoronary bypass graft: Secondary | ICD-10-CM | POA: Diagnosis not present

## 2018-02-07 DIAGNOSIS — I10 Essential (primary) hypertension: Secondary | ICD-10-CM | POA: Diagnosis not present

## 2018-02-07 DIAGNOSIS — Z7982 Long term (current) use of aspirin: Secondary | ICD-10-CM | POA: Insufficient documentation

## 2018-02-07 DIAGNOSIS — R040 Epistaxis: Secondary | ICD-10-CM

## 2018-02-07 DIAGNOSIS — Z87891 Personal history of nicotine dependence: Secondary | ICD-10-CM | POA: Diagnosis not present

## 2018-02-07 DIAGNOSIS — Z79899 Other long term (current) drug therapy: Secondary | ICD-10-CM | POA: Diagnosis not present

## 2018-02-07 MED ORDER — OXYMETAZOLINE HCL 0.05 % NA SOLN
NASAL | Status: AC
  Filled 2018-02-07: qty 15

## 2018-02-07 MED ORDER — LIDOCAINE HCL (PF) 1 % IJ SOLN
INTRAMUSCULAR | Status: AC
  Administered 2018-02-07: 5 mL via INTRADERMAL
  Filled 2018-02-07: qty 5

## 2018-02-07 MED ORDER — OXYMETAZOLINE HCL 0.05 % NA SOLN
NASAL | Status: AC
  Administered 2018-02-07: 1 via NASAL
  Filled 2018-02-07: qty 15

## 2018-02-07 MED ORDER — LIDOCAINE-EPINEPHRINE (PF) 2 %-1:200000 IJ SOLN
20.0000 mL | Freq: Once | INTRAMUSCULAR | Status: DC
  Filled 2018-02-07: qty 20

## 2018-02-07 MED ORDER — SILVER NITRATE-POT NITRATE 75-25 % EX MISC
CUTANEOUS | Status: AC
  Administered 2018-02-07: 1
  Filled 2018-02-07: qty 1

## 2018-02-07 MED ORDER — LIDOCAINE HCL (PF) 1 % IJ SOLN
5.0000 mL | Freq: Once | INTRAMUSCULAR | Status: AC
  Administered 2018-02-07: 5 mL via INTRADERMAL

## 2018-02-07 MED ORDER — OXYMETAZOLINE HCL 0.05 % NA SOLN
1.0000 | Freq: Once | NASAL | Status: AC
  Administered 2018-02-07: 1 via NASAL
  Filled 2018-02-07: qty 15

## 2018-02-07 NOTE — ED Provider Notes (Signed)
[facility] Emergency Department Provider Note    First MD Initiated Contact with Patient 02/07/18 1328     (approximate)  I have reviewed the triage vital signs and the nursing notes.   HISTORY  Chief Complaint Epistaxis    HPI Bradley Nelson is a 84 y.o. male on baby aspirin daily presents the ER with chief complaint of epistaxis from the right nare that started earlier today.  States he was trying to control it which is pressure but did not improve.  States he was recently started on amoxicillin as well as steroids just started these medications yesterday and wondered if the bleeding was related to that.  Denies any nasal trauma.  No lightheadedness, shortness of breath, chest pain or fevers.  Has had nasal packing to the right nasal passage over a decade ago.    Past Medical History:  Diagnosis Date  . Acute blood loss anemia 01/25/2013  . Angina, class II ([place]) 01/22/2013  . Aortic heart valve narrowing 01/17/2013  . Bloody pleural effusion 01/25/2013  . Essential (primary) hypertension 06/02/2014  . H/O aortic valve replacement 01/25/2013  . H/O coronary artery bypass surgery 01/25/2013   Overview:  cabg x3 with LIMA to LAD, SVG to PDA, SVG to left circumflex with septal muomectomy and aortic valve replacement with Ce Magna bioprosthetic valve on 01-24-13 by [name] at [facility].   . Heart failure ([place]) 01/22/2013  . History of open heart surgery 01/25/2013  . Hypercholesterolemia 10/15/2014  . Malignant neoplastic disease ([place]) 04/28/2015   Overview:  prostate cancer, s/p prostatectomy    . Prostate cancer ([place])   . Skin cancer    Family History  Problem Relation Age of Onset  . Prostate cancer Father   . Kidney cancer Neg Hx   . Bladder Cancer Neg Hx    Past Surgical History:  Procedure Laterality Date  . AORTIC VALVE REPLACEMENT    . CARDIAC SURGERY    . CORONARY ARTERY BYPASS GRAFT    . HERNIA REPAIR    . PROSTATECTOMY  2001   Dr. [name]    Patient Active Problem List   Diagnosis Date Noted  . Malignant neoplastic disease ([place]) 04/28/2015  . Hypercholesterolemia 10/15/2014  . Essential (primary) hypertension 06/02/2014  . Acute blood loss anemia 01/25/2013  . Bloody pleural effusion 01/25/2013  . Pain following surgery or procedure 01/25/2013  . H/O aortic valve replacement 01/25/2013  . H/O coronary artery bypass surgery 01/25/2013  . History of open heart surgery 01/25/2013  . Angina, class II ([place]) 01/22/2013  . Heart failure ([place]) 01/22/2013  . 3-vessel CAD 01/17/2013  . Aortic heart valve narrowing 01/17/2013      Prior to Admission medications   Medication Sig Start Date End Date Taking? Authorizing Provider  aspirin 81 MG chewable tablet Chew 1 tablet by mouth daily.    [provider]  Coenzyme Q10 (CO Q-10) 100 MG CAPS Take 100 mg by mouth daily.    [provider]  Lactobacillus (ACIDOPHILUS) 100 MG CAPS Take 1 capsule by mouth daily.    [provider]  metoprolol (LOPRESSOR) 50 MG tablet Take 1 tablet by mouth 2 (two) times daily. 12/15/15   [provider]  Multiple Vitamins-Minerals (CENTRUM SILVER) tablet Take 1 tablet by mouth daily.    [provider]  ranitidine (ZANTAC) 150 MG tablet Take 1 tablet by mouth 2 (two) times daily. 01/06/16   [provider]  simvastatin (ZOCOR) 80 MG  tablet Take 0.5 tablets by mouth every evening. 12/02/15   [provider]    Allergies Hydrocodone and Oxycodone    Social History Social History   Tobacco Use  . Smoking status: Former Research scientist (life sciences)  . Smokeless tobacco: Never Used  Substance Use Topics  . Alcohol use: No    Alcohol/week: 0.0 oz  . Drug use: No    Review of Systems Patient denies headaches, rhinorrhea, blurry vision, numbness, shortness of breath, chest pain, edema, cough, abdominal pain, nausea, vomiting, diarrhea, dysuria, fevers, rashes or hallucinations unless otherwise stated above  in HPI. ____________________________________________   PHYSICAL EXAM:  VITAL SIGNS: Vitals:   02/07/18 1014 02/07/18 1018  Pulse: 70   Resp: 18   Temp:  (!) 97.4 F (36.3 C)  SpO2: 98%     Constitutional: Alert and oriented.  Eyes: Conjunctivae are normal.  Head: Atraumatic. Nose: No congestion/rhinnorhea.  Small bleeding polyp to the right [place] area.  No masses.  No evidence of posterior bleed. Mouth/Throat: Mucous membranes are moist.   Neck: No stridor. Painless ROM.  Cardiovascular: Normal rate, regular rhythm. Grossly normal heart sounds.  Good peripheral circulation. Respiratory: Normal respiratory effort.  No retractions. Lungs CTAB. Gastrointestinal: Soft and nontender. No distention. No abdominal bruits. No CVA tenderness. Genitourinary:  Musculoskeletal: No lower extremity tenderness nor edema.  No joint effusions. Neurologic:  Normal speech and language. No gross focal neurologic deficits are appreciated. No facial droop Skin:  Skin is warm, dry and intact. No rash noted. Psychiatric: Mood and affect are normal. Speech and behavior are normal.  ____________________________________________   LABS (all labs ordered are listed, but only abnormal results are displayed)  No results found for this or any previous visit (from the past 24 hour(s)). ____________________________________________ ___________________________________  [id number]   ____________________________________________   PROCEDURES  Procedure(s) performed:  .Epistaxis Management Date/Time: 02/07/2018 2:44 PM Performed by: Merlyn Lot, MD Authorized by: Merlyn Lot, MD   Consent:    Consent obtained:  Verbal   Consent given by:  Patient   Risks discussed:  Bleeding, infection, pain and nasal injury   Alternatives discussed:  No treatment and delayed treatment Anesthesia (see MAR for exact dosages):    Anesthesia method:  Topical application   Topical anesthetic:   Lidocaine gel Procedure details:    Treatment site:  R anterior   Treatment method:  Merocel sponge   Treatment episode: initial   Post-procedure details:    Assessment:  Bleeding stopped   Patient tolerance of procedure:  Tolerated well, no immediate complications      Critical Care performed: no ____________________________________________   INITIAL IMPRESSION / ASSESSMENT AND PLAN / ED COURSE  Pertinent labs & imaging results that were available during my care of the patient were reviewed by me and considered in my medical decision making (see chart for details).   DDX: Anterior bleed, mass, posterior bleed  TAICHI REPKA is a 84 y.o. who presents to the ED with symptoms as described above.  Did attempt cauterization of the right anterior nasal passage polyp but had persistent bleeding after observation.  Based on the recurrence and persistence of the bleeding status post cauterization Merocel sponge was placed for packing with cessation of the bleeding.  Have discussed with the patient and available family all diagnostics and treatments performed thus far and all questions were answered to the best of my ability. The patient demonstrates understanding and agreement with plan.       As part of my medical  decision making, I reviewed the following data within the [place] notes reviewed and incorporated, Labs reviewed, notes from prior ED visits.  ____________________________________________   FINAL CLINICAL IMPRESSION(S) / ED DIAGNOSES  Final diagnoses:  Epistaxis  Anterior epistaxis      NEW MEDICATIONS STARTED DURING THIS VISIT:  New Prescriptions   No medications on file     Note:  This document was prepared using Dragon voice recognition software and may include unintentional dictation errors.    Merlyn Lot, MD 02/07/18 1446

## 2018-02-07 NOTE — ED Notes (Signed)
First Nurse Note:  Patient rechecked, no new bleeding noted.

## 2018-02-07 NOTE — ED Triage Notes (Signed)
Pt was recently started on a steroid and another med and is wondering if this has something to do with it.

## 2018-02-07 NOTE — ED Notes (Signed)
First Nurse Note: Patient asking have nose clip removed related to pain.  Hazle Coca RN spoke with patient who removed nose clip.  No bleeding noted.  Patient advised not to touch his nose.

## 2018-02-07 NOTE — ED Notes (Signed)
Pt alert and oriented X4, active, cooperative, pt in NAD. RR even and unlabored, color WNL.  Pt informed to return if any life threatening symptoms occur.  Discharge and followup instructions reviewed.  

## 2018-02-07 NOTE — ED Notes (Signed)
First Nurse Note: Patient to ED from [facility] with nosebleed.  Has nose clip on.  Ice pack applied to back of neck.  Hx of nosebleed in the past requiring cauterization.  Alert and oriented.

## 2018-02-07 NOTE — ED Triage Notes (Signed)
Pt reports nose bleed since 7am. Denies pain. Reports takes 10mg  asa daily. Pt reports flow is steady.

## 2018-02-12 DIAGNOSIS — R040 Epistaxis: Secondary | ICD-10-CM | POA: Diagnosis not present

## 2018-02-14 DIAGNOSIS — I10 Essential (primary) hypertension: Secondary | ICD-10-CM | POA: Diagnosis not present

## 2018-02-14 DIAGNOSIS — R040 Epistaxis: Secondary | ICD-10-CM | POA: Diagnosis not present

## 2018-02-14 DIAGNOSIS — I2510 Atherosclerotic heart disease of native coronary artery without angina pectoris: Secondary | ICD-10-CM | POA: Diagnosis not present

## 2018-02-26 DIAGNOSIS — I10 Essential (primary) hypertension: Secondary | ICD-10-CM | POA: Diagnosis not present

## 2018-02-26 DIAGNOSIS — D696 Thrombocytopenia, unspecified: Secondary | ICD-10-CM | POA: Diagnosis not present

## 2018-02-26 DIAGNOSIS — R040 Epistaxis: Secondary | ICD-10-CM | POA: Diagnosis not present

## 2018-02-26 DIAGNOSIS — E7800 Pure hypercholesterolemia, unspecified: Secondary | ICD-10-CM | POA: Diagnosis not present

## 2018-02-26 DIAGNOSIS — I5020 Unspecified systolic (congestive) heart failure: Secondary | ICD-10-CM | POA: Diagnosis not present

## 2018-03-15 DIAGNOSIS — Z953 Presence of xenogenic heart valve: Secondary | ICD-10-CM | POA: Diagnosis not present

## 2018-03-15 DIAGNOSIS — E7800 Pure hypercholesterolemia, unspecified: Secondary | ICD-10-CM | POA: Diagnosis not present

## 2018-03-15 DIAGNOSIS — I209 Angina pectoris, unspecified: Secondary | ICD-10-CM | POA: Diagnosis not present

## 2018-03-15 DIAGNOSIS — I10 Essential (primary) hypertension: Secondary | ICD-10-CM | POA: Diagnosis not present

## 2018-03-15 DIAGNOSIS — I2510 Atherosclerotic heart disease of native coronary artery without angina pectoris: Secondary | ICD-10-CM | POA: Diagnosis not present

## 2018-03-15 DIAGNOSIS — I340 Nonrheumatic mitral (valve) insufficiency: Secondary | ICD-10-CM | POA: Diagnosis not present

## 2018-03-15 DIAGNOSIS — I350 Nonrheumatic aortic (valve) stenosis: Secondary | ICD-10-CM | POA: Diagnosis not present

## 2018-03-15 DIAGNOSIS — Z951 Presence of aortocoronary bypass graft: Secondary | ICD-10-CM | POA: Diagnosis not present

## 2018-04-30 DIAGNOSIS — I10 Essential (primary) hypertension: Secondary | ICD-10-CM | POA: Diagnosis not present

## 2018-05-07 DIAGNOSIS — Z0001 Encounter for general adult medical examination with abnormal findings: Secondary | ICD-10-CM | POA: Diagnosis not present

## 2018-05-07 DIAGNOSIS — E7800 Pure hypercholesterolemia, unspecified: Secondary | ICD-10-CM | POA: Diagnosis not present

## 2018-05-07 DIAGNOSIS — I5020 Unspecified systolic (congestive) heart failure: Secondary | ICD-10-CM | POA: Diagnosis not present

## 2018-05-07 DIAGNOSIS — I10 Essential (primary) hypertension: Secondary | ICD-10-CM | POA: Diagnosis not present

## 2018-06-09 DIAGNOSIS — R319 Hematuria, unspecified: Secondary | ICD-10-CM | POA: Diagnosis not present

## 2018-06-09 DIAGNOSIS — R1012 Left upper quadrant pain: Secondary | ICD-10-CM | POA: Diagnosis not present

## 2018-06-09 DIAGNOSIS — R3129 Other microscopic hematuria: Secondary | ICD-10-CM | POA: Diagnosis not present

## 2018-07-31 DIAGNOSIS — I10 Essential (primary) hypertension: Secondary | ICD-10-CM | POA: Diagnosis not present

## 2018-07-31 DIAGNOSIS — Z0001 Encounter for general adult medical examination with abnormal findings: Secondary | ICD-10-CM | POA: Diagnosis not present

## 2018-08-06 DIAGNOSIS — F329 Major depressive disorder, single episode, unspecified: Secondary | ICD-10-CM | POA: Diagnosis not present

## 2018-08-06 DIAGNOSIS — I2510 Atherosclerotic heart disease of native coronary artery without angina pectoris: Secondary | ICD-10-CM | POA: Diagnosis not present

## 2018-08-06 DIAGNOSIS — E7800 Pure hypercholesterolemia, unspecified: Secondary | ICD-10-CM | POA: Diagnosis not present

## 2018-08-06 DIAGNOSIS — I10 Essential (primary) hypertension: Secondary | ICD-10-CM | POA: Diagnosis not present

## 2018-08-06 DIAGNOSIS — I5020 Unspecified systolic (congestive) heart failure: Secondary | ICD-10-CM | POA: Diagnosis not present

## 2018-08-06 DIAGNOSIS — D696 Thrombocytopenia, unspecified: Secondary | ICD-10-CM | POA: Diagnosis not present

## 2018-09-12 DIAGNOSIS — E7800 Pure hypercholesterolemia, unspecified: Secondary | ICD-10-CM | POA: Diagnosis not present

## 2018-09-12 DIAGNOSIS — I10 Essential (primary) hypertension: Secondary | ICD-10-CM | POA: Diagnosis not present

## 2018-09-12 DIAGNOSIS — I340 Nonrheumatic mitral (valve) insufficiency: Secondary | ICD-10-CM | POA: Diagnosis not present

## 2018-09-12 DIAGNOSIS — I5020 Unspecified systolic (congestive) heart failure: Secondary | ICD-10-CM | POA: Diagnosis not present

## 2018-09-12 DIAGNOSIS — Z951 Presence of aortocoronary bypass graft: Secondary | ICD-10-CM | POA: Diagnosis not present

## 2018-09-12 DIAGNOSIS — I2510 Atherosclerotic heart disease of native coronary artery without angina pectoris: Secondary | ICD-10-CM | POA: Diagnosis not present

## 2018-09-12 DIAGNOSIS — I350 Nonrheumatic aortic (valve) stenosis: Secondary | ICD-10-CM | POA: Diagnosis not present

## 2018-09-12 DIAGNOSIS — Z953 Presence of xenogenic heart valve: Secondary | ICD-10-CM | POA: Diagnosis not present

## 2018-09-27 DIAGNOSIS — I5020 Unspecified systolic (congestive) heart failure: Secondary | ICD-10-CM | POA: Diagnosis not present

## 2018-09-27 DIAGNOSIS — Z953 Presence of xenogenic heart valve: Secondary | ICD-10-CM | POA: Diagnosis not present

## 2018-10-03 DIAGNOSIS — I350 Nonrheumatic aortic (valve) stenosis: Secondary | ICD-10-CM | POA: Diagnosis not present

## 2018-10-03 DIAGNOSIS — E7800 Pure hypercholesterolemia, unspecified: Secondary | ICD-10-CM | POA: Diagnosis not present

## 2018-10-03 DIAGNOSIS — Z953 Presence of xenogenic heart valve: Secondary | ICD-10-CM | POA: Diagnosis not present

## 2018-10-03 DIAGNOSIS — I2510 Atherosclerotic heart disease of native coronary artery without angina pectoris: Secondary | ICD-10-CM | POA: Diagnosis not present

## 2018-10-03 DIAGNOSIS — I340 Nonrheumatic mitral (valve) insufficiency: Secondary | ICD-10-CM | POA: Diagnosis not present

## 2018-10-03 DIAGNOSIS — I10 Essential (primary) hypertension: Secondary | ICD-10-CM | POA: Diagnosis not present

## 2018-10-03 DIAGNOSIS — Z951 Presence of aortocoronary bypass graft: Secondary | ICD-10-CM | POA: Diagnosis not present

## 2018-10-08 DIAGNOSIS — Z0000 Encounter for general adult medical examination without abnormal findings: Secondary | ICD-10-CM | POA: Diagnosis not present

## 2018-10-08 DIAGNOSIS — I10 Essential (primary) hypertension: Secondary | ICD-10-CM | POA: Diagnosis not present

## 2018-10-08 DIAGNOSIS — K5900 Constipation, unspecified: Secondary | ICD-10-CM | POA: Diagnosis not present

## 2018-10-08 DIAGNOSIS — R29898 Other symptoms and signs involving the musculoskeletal system: Secondary | ICD-10-CM | POA: Diagnosis not present

## 2018-10-08 DIAGNOSIS — F329 Major depressive disorder, single episode, unspecified: Secondary | ICD-10-CM | POA: Diagnosis not present

## 2018-10-08 DIAGNOSIS — E7800 Pure hypercholesterolemia, unspecified: Secondary | ICD-10-CM | POA: Diagnosis not present

## 2018-12-05 ENCOUNTER — Ambulatory Visit: Payer: PPO

## 2018-12-10 ENCOUNTER — Encounter: Payer: PPO | Admitting: Physical Therapy

## 2019-01-02 DIAGNOSIS — I10 Essential (primary) hypertension: Secondary | ICD-10-CM | POA: Diagnosis not present

## 2019-01-09 DIAGNOSIS — F329 Major depressive disorder, single episode, unspecified: Secondary | ICD-10-CM | POA: Diagnosis not present

## 2019-01-09 DIAGNOSIS — I5020 Unspecified systolic (congestive) heart failure: Secondary | ICD-10-CM | POA: Diagnosis not present

## 2019-01-09 DIAGNOSIS — E7800 Pure hypercholesterolemia, unspecified: Secondary | ICD-10-CM | POA: Diagnosis not present

## 2019-01-09 DIAGNOSIS — R29898 Other symptoms and signs involving the musculoskeletal system: Secondary | ICD-10-CM | POA: Diagnosis not present

## 2019-01-09 DIAGNOSIS — I10 Essential (primary) hypertension: Secondary | ICD-10-CM | POA: Diagnosis not present

## 2019-01-09 DIAGNOSIS — D696 Thrombocytopenia, unspecified: Secondary | ICD-10-CM | POA: Diagnosis not present

## 2019-01-23 DIAGNOSIS — E7800 Pure hypercholesterolemia, unspecified: Secondary | ICD-10-CM | POA: Diagnosis not present

## 2019-01-23 DIAGNOSIS — I350 Nonrheumatic aortic (valve) stenosis: Secondary | ICD-10-CM | POA: Diagnosis not present

## 2019-01-23 DIAGNOSIS — Z951 Presence of aortocoronary bypass graft: Secondary | ICD-10-CM | POA: Diagnosis not present

## 2019-01-23 DIAGNOSIS — I10 Essential (primary) hypertension: Secondary | ICD-10-CM | POA: Diagnosis not present

## 2019-01-23 DIAGNOSIS — I2510 Atherosclerotic heart disease of native coronary artery without angina pectoris: Secondary | ICD-10-CM | POA: Diagnosis not present

## 2019-01-23 DIAGNOSIS — Z953 Presence of xenogenic heart valve: Secondary | ICD-10-CM | POA: Diagnosis not present

## 2019-01-23 DIAGNOSIS — I5020 Unspecified systolic (congestive) heart failure: Secondary | ICD-10-CM | POA: Diagnosis not present

## 2019-01-23 DIAGNOSIS — I340 Nonrheumatic mitral (valve) insufficiency: Secondary | ICD-10-CM | POA: Diagnosis not present

## 2019-02-12 ENCOUNTER — Ambulatory Visit: Payer: PPO | Attending: Internal Medicine

## 2019-02-12 ENCOUNTER — Other Ambulatory Visit: Payer: Self-pay

## 2019-02-12 DIAGNOSIS — M6281 Muscle weakness (generalized): Secondary | ICD-10-CM | POA: Diagnosis not present

## 2019-02-12 DIAGNOSIS — R2681 Unsteadiness on feet: Secondary | ICD-10-CM | POA: Insufficient documentation

## 2019-02-12 DIAGNOSIS — R278 Other lack of coordination: Secondary | ICD-10-CM | POA: Insufficient documentation

## 2019-02-12 DIAGNOSIS — Z9181 History of falling: Secondary | ICD-10-CM | POA: Insufficient documentation

## 2019-02-12 NOTE — Patient Instructions (Signed)
Access Code: [email or web address]  URL: [email or web address]  Date: 02/12/2019  Prepared by: Roxana Hires   Exercises Standing Romberg to 1/2 Tandem Stance - 3 reps - 30s x 3 with each leg forward hold - 2x daily - 7x weekly Sit to Stand without Arm Support - 10 reps - 2 sets - 1x daily - 7x weekly

## 2019-02-12 NOTE — Therapy (Addendum)
[place] MAIN [facility] SERVICES [address] [place], [place], [address] Phone: [phone]   Fax:  [phone]  Physical Therapy Evaluation  Patient Details  Name: Bradley Nelson MRN: [id number] Date of Birth: Oct 23, 1931 Referring Provider (PT): Dr Velna Ochs   Encounter Date: 02/12/2019    PT End of Session - 02/19/19 1441    Visit Number  1    Number of Visits  25    Authorization Type  eval 02/12/19    PT Start Time  3329    PT Stop Time  1445    PT Time Calculation (min)  60 min    Equipment Utilized During Treatment  Gait belt    Activity Tolerance  Patient tolerated treatment well    Behavior During Therapy  [facility] for tasks assessed/performed        Past Medical History:  Diagnosis Date  . Acute blood loss anemia 01/25/2013  . Angina, class II ([place]) 01/22/2013  . Aortic heart valve narrowing 01/17/2013  . Bloody pleural effusion 01/25/2013  . Essential (primary) hypertension 06/02/2014  . H/O aortic valve replacement 01/25/2013  . H/O coronary artery bypass surgery 01/25/2013   Overview:  cabg x3 with LIMA to LAD, SVG to PDA, SVG to left circumflex with septal muomectomy and aortic valve replacement with Ce Magna bioprosthetic valve on 01-24-13 by [name] at [facility]   . Heart failure ([place]) 01/22/2013  . History of open heart surgery 01/25/2013  . Hypercholesterolemia 10/15/2014  . Malignant neoplastic disease ([place]) 04/28/2015   Overview:  prostate cancer, s/p prostatectomy    . Prostate cancer ([place])   . Skin cancer     Past Surgical History:  Procedure Laterality Date  . AORTIC VALVE REPLACEMENT    . CARDIAC SURGERY    . CORONARY ARTERY BYPASS GRAFT    . HERNIA REPAIR    . PROSTATECTOMY  2001   Dr. [name]    There were no vitals filed for this visit.   Subjective Assessment - 02/12/19 1340    Subjective  Balance difficulty    Pertinent History  Pt reports that he has been struggling with his balance since his wife passed  approximately 1 year ago. He states that he has had "1-2 falls" in the last 12 months. Otherwise he denies any other known triggers or recent changes in his health. He has a significant cardiac history of CAD and artificial aortic valve with normal EF on last echo. He is followed by cardiology.    Patient Stated Goals  "Improve my balance"    Currently in Pain?  No/denies         [facility] PT Assessment - 02/12/19 1340      Assessment   Medical Diagnosis  Weakness of lower extremity    Referring Provider (PT)  Dr Velna Ochs    Onset Date/Surgical Date  02/11/18   Approximate   Hand Dominance  Right    Next MD Visit  December 2020 with PCP    Prior Therapy  None, Hx of cardiac rehab after heart surgery      Precautions   Precautions  Fall      Restrictions   Weight Bearing Restrictions  No      Balance Screen   Has the patient fallen in the past 6 months  Yes    How many times?  2    Has the patient had a decrease in activity level because of a fear  of falling?   Yes    Is the patient reluctant to leave their home because of a fear of falling?   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[place], educational    Leisure  Pt used to work on cars but hasn't done recently      [employer]   Overall Cognitive Status  Within Functional Limits for tasks assessed    Significantly HOH     Observation/Other Assessments   Other Surveys   Activities of Balance and Confidence Scale    Activities of Balance Confidence Scale (ABC Scale)   67.5%      Standardized Balance Assessment   Standardized Balance Assessment  Berg Balance Test;Dynamic Gait Index;Timed Up and Go Test;Five Times Sit to Stand;10 meter walk test    Five times sit to stand comments   19.0 seconds    10 Meter Walk  Self-selected: 18.6s = 0.54 m/s; Fastest: 12.8s = 0.78 m/s      Berg Balance Test   Sit to Stand  Able to stand without using hands and stabilize independently    Standing Unsupported  Able to stand safely 2 minutes    Sitting with Back Unsupported but Feet Supported on Floor or Stool  Able to sit safely and securely 2 minutes    Stand to Sit  Sits safely with minimal use of hands    Transfers  Able to transfer safely, minor use of hands    Standing Unsupported with Eyes Closed  Able to stand 10 seconds safely    Standing Unsupported with Feet Together  Able to place feet together independently and stand 1 minute safely    From Standing, Reach Forward with Outstretched Arm  Can reach forward >12 cm safely (5")    From Standing Position, Pick up Object from Floor  Able to pick up shoe safely and easily    From Standing Position, Turn to Look Behind Over each Shoulder  Looks behind from both sides and weight shifts well    Turn 360 Degrees  Able to turn 360 degrees safely but slowly    Standing Unsupported, Alternately Place Feet on Step/Stool  Able to stand independently and safely and complete 8 steps in 20 seconds    Standing Unsupported, One Foot in Front  Able to plae foot ahead of the other independently and hold 30 seconds    Standing on One Leg  Tries to lift leg/unable to hold 3 seconds but remains standing independently    Total Score  49      Dynamic Gait Index   Level Surface  Mild Impairment    Change in Gait Speed  Mild Impairment  Gait with Horizontal Head  Turns  Normal    Gait with Vertical Head Turns  Normal    Gait and Pivot Turn  Normal    Step Over Obstacle  Mild Impairment    Step Around Obstacles  Normal    Steps  Mild Impairment    Total Score  20      Timed Up and Go Test   TUG  Normal TUG    Normal TUG (seconds)  20.4         SUBJECTIVE Chief complaint: Pt reports that he has been struggling with his balance since his wife passed approximately 1 year ago. He states that he has had "1-2 falls" in the last 12 months. Otherwise he denies any other known triggers or recent changes in his health. He has a significant cardiac history of CAD and artificial aortic valve with normal EF on last echo. He is followed by cardiology. Recent changes in overall health/medication: No Directional pattern for falls: None but pt reports that he really struggles with backwards ambulation Prior history of physical therapy for balance: None, but he does have a history of cardiac rehab following heart valve replacement. Follow-up appointment with MD: Follow-up with PCP in December 2020. Red flags (bowel/bladder changes, saddle paresthesia, personal history of cancer, chills/fever, night sweats, unrelenting pain) Negative   OBJECTIVE  MUSCULOSKELETAL: Tremor: Absent Bulk: Normal Tone: Normal, no clonus  Posture Forward head and rounded shoulders  Gait Short shuffling steps during gait. Decreased speed. No assistive device  Strength R/L 4/4 Hip flexion Seated hip ER/IR appears grossly WFL 5/5 Knee extension 5/5 Knee flexion 5/5 Ankle Dorsiflexion 5/5 Shoulder flexion  5/5 Shoulder abduction 5/5 Elbow flexion (biceps brachii, brachialis, brachioradialis, musculoskeletal n, C5/6) 5/5 Elbow extension (triceps, radial n, C7) Grip: WFL bilaterally   NEUROLOGICAL:  Mental Status Patient is oriented to person, place and time.  Recent memory is intact.  Remote memory is intact.  Attention span and concentration are intact.   Expressive speech is intact however pt struggles with his hearing Patient's fund of knowledge is within normal limits for educational level.  Sensation Deferred  Reflexes Deferred  Coordination/Cerebellar Deferred  FUNCTIONAL OUTCOME MEASURES   Results Comments  BERG 49/56 Fall risk, in need of intervention  DGI 20/24 Fall risk, in need of intervention  TUG 20.4 seconds Fall risk, in need of intervention  5TSTS 19.0 seconds Fall risk, in need of intervention  10 Meter Gait Speed Self-selected: 18.6s = 0.54 m/s; Fastest: 12.8s = 0.78 m/s Below normative values for full community ambulation  ABC Scale 67.5% Fall risk but just above cut-off             Objective measurements completed on examination: See above findings.      TREATMENT   Neuromuscular Re-education  Semitandem balance without UE support alternating forward LE 30s x 2 each; Sit to stand without UE support x 10; Reviewed HEP with patient and explained how to perform safely at home        PT Education - 02/12/19 1441    Education Details  Plan of care and HEP    Person(s) Educated  Patient    Methods  Explanation;Handout    Comprehension  Verbalized understanding       PT Short Term Goals - 02/12/19 1614      PT SHORT TERM GOAL #1   Title  Pt will be independent with HEP in order to improve strength and balance in order to decrease fall  risk and improve function at home.    Time  6    Period  Weeks    Status  New    Target Date  03/26/19        PT Long Term Goals - 02/12/19 1614      PT LONG TERM GOAL #1   Title  Pt will improve BERG by at least 3 points in order to demonstrate clinically significant improvement in balance.    Baseline  02/12/19: 49/56    Time  12    Period  Weeks    Status  New    Target Date  05/07/19      PT LONG TERM GOAL #2   Title  Pt will improve DGI by at least 3 points in order to demonstrate clinically significant improvement in balance and decreased  risk for falls.    Baseline  02/12/19: 20/24    Time  12    Period  Weeks    Status  New    Target Date  05/07/19      PT LONG TERM GOAL #3   Title  Pt will decrease 5TSTS by at least 3 seconds in order to demonstrate clinically significant improvement in LE strength.    Baseline  02/12/19: 19.0s    Time  12    Period  Weeks    Status  New    Target Date  05/07/19      PT LONG TERM GOAL #4   Title  Pt will improve ABC by at least 13% in order to demonstrate clinically significant improvement in balance confidence.    Baseline  02/12/19: 67.5%    Time  12    Period  Weeks    Status  New    Target Date  05/07/19      PT LONG TERM GOAL #5   Title  Pt will decrease TUG to below 14 seconds/decrease in order to demonstrate decreased fall risk.    Baseline  02/12/19: 20.4s    Time  12    Period  Weeks    Status  New    Target Date  05/07/19             Plan - 02/12/19 1437    Clinical Impression Statement  Pt is a pleasant 85 year-old male referred for difficulty with balance. PT examination reveals deficits in gait with shuffling steps and decreased speed. LE strength and balance are impaired as identified by abnormal BERG, TUG, 5TSTS, and 31m gait speed. Pt will benefit from skilled PT services to address deficits in balance and decrease risk for future falls.    Personal Factors and Comorbidities  Age;Fitness;Comorbidity 1;Comorbidity 2;Time since onset of injury/illness/exacerbation    Comorbidities  Depression, CAD    Examination-Activity Limitations  Caring for Others;Squat;Stairs;Stand    Examination-Participation Restrictions  Cleaning;Community Activity;Yard Work;Shop    Stability/Clinical Decision Making  Evolving/Moderate complexity    Clinical Decision Making  Moderate    Rehab Potential  Good    PT Frequency  2x / week    PT Duration  12 weeks    PT Treatment/Interventions  ADLs/Self Care Home Management;Aquatic Therapy;Biofeedback;Canalith  Repostioning;Cryotherapy;Electrical Stimulation;Iontophoresis 4mg /ml Dexamethasone;Moist Heat;Traction;Ultrasound;DME Instruction;Gait training;Stair training;Functional mobility training;Therapeutic activities;Therapeutic exercise;Balance training;Neuromuscular re-education;Cognitive remediation;Patient/family education;Manual techniques;Vestibular    PT Next Visit Plan  6MWT, review HEP, progress strength and balance    PT Home Exercise Plan  Medbridge Access Code: [email or web address] (Semitandem balance and sit to stand repeats)    Consulted and  Agree with Plan of Care  Patient       Patient will benefit from skilled therapeutic intervention in order to improve the following deficits and impairments:  Abnormal gait, Cardiopulmonary status limiting activity, Decreased activity tolerance, Decreased balance, Decreased endurance, Decreased strength, Difficulty walking  Visit Diagnosis: 1. Unsteadiness on feet   2. Muscle weakness (generalized)   3. History of falling        Problem List Patient Active Problem List   Diagnosis Date Noted  . Malignant neoplastic disease ([place]) 04/28/2015  . Hypercholesterolemia 10/15/2014  . Essential (primary) hypertension 06/02/2014  . Acute blood loss anemia 01/25/2013  . Bloody pleural effusion 01/25/2013  . Pain following surgery or procedure 01/25/2013  . H/O aortic valve replacement 01/25/2013  . H/O coronary artery bypass surgery 01/25/2013  . History of open heart surgery 01/25/2013  . Angina, class II ([place]) 01/22/2013  . Heart failure ([place]) 01/22/2013  . 3-vessel CAD 01/17/2013  . Aortic heart valve narrowing 01/17/2013   Phillips Grout PT, DPT, GCS  Orianna Biskup 02/12/2019, 4:19 PM  [place] MAIN [facility] SERVICES [address] [place], [place], [address] Phone: [phone]   Fax:  [phone]  Name: Bradley Nelson MRN: [id number] Date of Birth: January 17, 1932

## 2019-02-13 DIAGNOSIS — H903 Sensorineural hearing loss, bilateral: Secondary | ICD-10-CM | POA: Diagnosis not present

## 2019-02-13 DIAGNOSIS — H9201 Otalgia, right ear: Secondary | ICD-10-CM | POA: Diagnosis not present

## 2019-02-13 DIAGNOSIS — H6123 Impacted cerumen, bilateral: Secondary | ICD-10-CM | POA: Diagnosis not present

## 2019-02-19 ENCOUNTER — Other Ambulatory Visit: Payer: Self-pay

## 2019-02-19 ENCOUNTER — Ambulatory Visit: Payer: PPO

## 2019-02-19 DIAGNOSIS — R2681 Unsteadiness on feet: Secondary | ICD-10-CM | POA: Diagnosis not present

## 2019-02-19 DIAGNOSIS — M6281 Muscle weakness (generalized): Secondary | ICD-10-CM

## 2019-02-19 DIAGNOSIS — Z9181 History of falling: Secondary | ICD-10-CM

## 2019-02-19 NOTE — Therapy (Signed)
[place] MAIN [facility] SERVICES [address] [place], [place], [address] Phone: [phone]   Fax:  [phone]  Physical Therapy Treatment  Patient Details  Name: Bradley Nelson MRN: [id number] Date of Birth: 09/04/1931 Referring Provider (PT): Dr Velna Ochs   Encounter Date: 02/19/2019  PT End of Session - 02/19/19 1515    Visit Number  2    Number of Visits  25    Authorization Type  eval 02/12/19    Authorization Time Period  2/10 eval 7/7    PT Start Time  1416    PT Stop Time  1501    PT Time Calculation (min)  45 min    Equipment Utilized During Treatment  Gait belt    Activity Tolerance  Patient tolerated treatment well;Other (comment)   BP   Behavior During Therapy  WFL for tasks assessed/performed       Past Medical History:  Diagnosis Date  . Acute blood loss anemia 01/25/2013  . Angina, class II ([place]) 01/22/2013  . Aortic heart valve narrowing 01/17/2013  . Bloody pleural effusion 01/25/2013  . Essential (primary) hypertension 06/02/2014  . H/O aortic valve replacement 01/25/2013  . H/O coronary artery bypass surgery 01/25/2013   Overview:  cabg x3 with LIMA to LAD, SVG to PDA, SVG to left circumflex with septal muomectomy and aortic valve replacement with Ce Magna bioprosthetic valve on 01-24-13 by [name] at [facility]   . Heart failure ([place]) 01/22/2013  . History of open heart surgery 01/25/2013  . Hypercholesterolemia 10/15/2014  . Malignant neoplastic disease ([place]) 04/28/2015   Overview:  prostate cancer, s/p prostatectomy    . Prostate cancer ([place])   . Skin cancer     Past Surgical History:  Procedure Laterality Date  . AORTIC VALVE REPLACEMENT    . CARDIAC SURGERY    . CORONARY ARTERY BYPASS GRAFT    . HERNIA REPAIR    . PROSTATECTOMY  2001   Dr. [name]    There were no vitals filed for this visit.  Subjective Assessment - 02/19/19 1419    Subjective  Patient reports no falls since last session. has occasional  back pain that "catches him and makes him unsteady".    Pertinent History  Pt reports that he has been struggling with his balance since his wife passed approximately 1 year ago. He states that he has had "1-2 falls" in the last 12 months. Otherwise he denies any other known triggers or recent changes in his health. He has a significant cardiac history of CAD and artificial aortic valve with normal EF on last echo. He is followed by cardiology.    Patient Stated Goals  "Improve my balance"    Currently in Pain?  No/denies           Start by taking BP: 88/63 pulse 57 LLE   second attempt: 88/ 58 pulse 57 RLE  Treatment:  TherEx 6 OVF:IEPPIRJJ due to BP  Neuro Re-ed: patient requires CGA with all standing interventions. Cueing for widening BOS in combination with tactile cues for postural correction needed throughout standing interventions.  Step over orange hurdle forward and back 12x each LE; BUE support; occasional posterior trunk lean resulting in near LOB Lateral step over hurdle and back 15x each LE; BUE support  Red mat unstable surface in // bars ambulate 6x length of // bars; cueing for widening BOS and increasing knee/hip flexion for foot clearance to reduce drag.  Semitandem balance  on airex pad without UE support alternating forward LE 30s x3 trials each LE back; airex pad: balloon taps inside and outside BOS x3 minutes with no LOB.  airex pad 6 inch step toe taps no UE support 15x each trial.  Ambulate in // bars with 2x4 between feet for widening BOS, patient excessively ER bilaterally with narrow BOS at baseline.  Sit to stand without UE support x 10;  Patient educated on monitoring blood pressure and medication. Agreeable to call physician if continues to be low.                 PT Education - 02/19/19 1421    Education Details  exercise technique, stability    Person(s) Educated  Patient    Methods  Explanation;Demonstration;Tactile cues;Verbal cues     Comprehension  Verbalized understanding;Returned demonstration;Verbal cues required;Tactile cues required       PT Short Term Goals - 02/12/19 1614      PT SHORT TERM GOAL #1   Title  Pt will be independent with HEP in order to improve strength and balance in order to decrease fall risk and improve function at home.    Time  6    Period  Weeks    Status  New    Target Date  03/26/19        PT Long Term Goals - 02/12/19 1614      PT LONG TERM GOAL #1   Title  Pt will improve BERG by at least 3 points in order to demonstrate clinically significant improvement in balance.    Baseline  02/12/19: 49/56    Time  12    Period  Weeks    Status  New    Target Date  05/07/19      PT LONG TERM GOAL #2   Title  Pt will improve DGI by at least 3 points in order to demonstrate clinically significant improvement in balance and decreased risk for falls.    Baseline  02/12/19: 20/24    Time  12    Period  Weeks    Status  New    Target Date  05/07/19      PT LONG TERM GOAL #3   Title  Pt will decrease 5TSTS by at least 3 seconds in order to demonstrate clinically significant improvement in LE strength.    Baseline  02/12/19: 19.0s    Time  12    Period  Weeks    Status  New    Target Date  05/07/19      PT LONG TERM GOAL #4   Title  Pt will improve ABC by at least 13% in order to demonstrate clinically significant improvement in balance confidence.    Baseline  02/12/19: 67.5%    Time  12    Period  Weeks    Status  New    Target Date  05/07/19      PT LONG TERM GOAL #5   Title  Pt will decrease TUG to below 14 seconds/decrease in order to demonstrate decreased fall risk.    Baseline  02/12/19: 20.4s    Time  12    Period  Weeks    Status  New    Target Date  05/07/19            Plan - 02/19/19 1515    Clinical Impression Statement  Patient presents to physical therapy with low BP, patient educated on monitoring blood pressure and medication management  with patient agreeable to  contact physician if pressure remains low. Six minute walk test deferred to blood pressure levels. Stability training within // bars performed with patient challenged with foot clearance, single limb stance, and prolonged muscle activation. Pt will benefit from skilled PT services to address deficits in balance and decrease risk for future falls.    Personal Factors and Comorbidities  Age;Fitness;Comorbidity 1;Comorbidity 2;Time since onset of injury/illness/exacerbation    Comorbidities  Depression, CAD    Examination-Activity Limitations  Caring for Others;Squat;Stairs;Stand    Examination-Participation Restrictions  Cleaning;Community Activity;Yard Work;Shop    Stability/Clinical Decision Making  Evolving/Moderate complexity    Rehab Potential  Good    PT Frequency  2x / week    PT Duration  12 weeks    PT Treatment/Interventions  ADLs/Self Care Home Management;Aquatic Therapy;Biofeedback;Canalith Repostioning;Cryotherapy;Electrical Stimulation;Iontophoresis 4mg /ml Dexamethasone;Moist Heat;Traction;Ultrasound;DME Instruction;Gait training;Stair training;Functional mobility training;Therapeutic activities;Therapeutic exercise;Balance training;Neuromuscular re-education;Cognitive remediation;Patient/family education;Manual techniques;Vestibular    PT Next Visit Plan  6MWT, review HEP, progress strength and balance    PT Home Exercise Plan  Medbridge Access Code: [email or web address] (Semitandem balance and sit to stand repeats)    Consulted and Agree with Plan of Care  Patient       Patient will benefit from skilled therapeutic intervention in order to improve the following deficits and impairments:  Abnormal gait, Cardiopulmonary status limiting activity, Decreased activity tolerance, Decreased balance, Decreased endurance, Decreased strength, Difficulty walking  Visit Diagnosis: 1. Unsteadiness on feet   2. Muscle weakness (generalized)   3. History of falling        Problem List Patient Active  Problem List   Diagnosis Date Noted  . Malignant neoplastic disease ([place]) 04/28/2015  . Hypercholesterolemia 10/15/2014  . Essential (primary) hypertension 06/02/2014  . Acute blood loss anemia 01/25/2013  . Bloody pleural effusion 01/25/2013  . Pain following surgery or procedure 01/25/2013  . H/O aortic valve replacement 01/25/2013  . H/O coronary artery bypass surgery 01/25/2013  . History of open heart surgery 01/25/2013  . Angina, class II ([place]) 01/22/2013  . Heart failure ([place]) 01/22/2013  . 3-vessel CAD 01/17/2013  . Aortic heart valve narrowing 01/17/2013   Janna Arch, PT, DPT   02/19/2019, 3:17 PM  [place] MAIN [facility] SERVICES [address] [place], [place], [address] Phone: [phone]   Fax:  [phone]  Name: JEREMIYAH CULLENS MRN: [id number] Date of Birth: [birth year]

## 2019-02-21 ENCOUNTER — Ambulatory Visit: Payer: PPO

## 2019-02-21 ENCOUNTER — Other Ambulatory Visit: Payer: Self-pay

## 2019-02-21 VITALS — BP 100/70 | HR 65

## 2019-02-21 DIAGNOSIS — M6281 Muscle weakness (generalized): Secondary | ICD-10-CM

## 2019-02-21 DIAGNOSIS — R2681 Unsteadiness on feet: Secondary | ICD-10-CM | POA: Diagnosis not present

## 2019-02-21 DIAGNOSIS — Z9181 History of falling: Secondary | ICD-10-CM

## 2019-02-21 NOTE — Therapy (Signed)
[place] MAIN [facility] SERVICES [address] [place], [place], [address] Phone: [phone]   Fax:  [phone]  Physical Therapy Treatment  Patient Details  Name: Bradley Nelson MRN: [id number] Date of Birth: [birth year] Referring Provider (PT): Dr Velna Ochs   Encounter Date: 02/21/2019  PT End of Session - 02/21/19 1415    Visit Number  3    Number of Visits  25    Authorization Type  eval 02/12/19    Authorization Time Period  2/10 eval 7/7    PT Start Time  1300    PT Stop Time  1345    PT Time Calculation (min)  45 min    Equipment Utilized During Treatment  Gait belt    Activity Tolerance  Patient tolerated treatment well;Other (comment)   BP   Behavior During Therapy  WFL for tasks assessed/performed       Past Medical History:  Diagnosis Date  . Acute blood loss anemia 01/25/2013  . Angina, class II ([place]) 01/22/2013  . Aortic heart valve narrowing 01/17/2013  . Bloody pleural effusion 01/25/2013  . Essential (primary) hypertension 06/02/2014  . H/O aortic valve replacement 01/25/2013  . H/O coronary artery bypass surgery 01/25/2013   Overview:  cabg x3 with LIMA to LAD, SVG to PDA, SVG to left circumflex with septal muomectomy and aortic valve replacement with Ce Magna bioprosthetic valve on 01-24-13 by [name] at [facility].   . Heart failure ([place]) 01/22/2013  . History of open heart surgery 01/25/2013  . Hypercholesterolemia 10/15/2014  . Malignant neoplastic disease ([place]) 04/28/2015   Overview:  prostate cancer, s/p prostatectomy    . Prostate cancer ([place])   . Skin cancer     Past Surgical History:  Procedure Laterality Date  . AORTIC VALVE REPLACEMENT    . CARDIAC SURGERY    . CORONARY ARTERY BYPASS GRAFT    . HERNIA REPAIR    . PROSTATECTOMY  2001   Dr. [name]    Vitals:   02/21/19 1309  BP: 100/70  Pulse: 65  SpO2: 100%    Subjective Assessment - 02/21/19 1406    Subjective  Patient reports that he is doing  okay today.  He reports no pain or soreness today.  He reports no falls since last session.  He has no new questions or concerns at this point.    Pertinent History  Pt reports that he has been struggling with his balance since his wife passed approximately 1 year ago. He states that he has had "1-2 falls" in the last 12 months. Otherwise he denies any other known triggers or recent changes in his health. He has a significant cardiac history of CAD and artificial aortic valve with normal EF on last echo. He is followed by cardiology.    Patient Stated Goals  "Improve my balance"    Currently in Pain?  No/denies         [facility] PT Assessment - 02/21/19 1400      6 Minute Walk- Baseline   6 Minute Walk- Baseline  yes    BP (mmHg)  100/70    HR (bpm)  65    02 Sat (%RA)  100 %    Modified Borg Scale for Dyspnea  0- Nothing at all    Perceived Rate of Exertion (Borg)  7- Very, very light      6 Minute walk- Post Test   6 Minute Walk Post Test  yes  BP (mmHg)  110/74    HR (bpm)  66    02 Sat (%RA)  100 %    Modified Borg Scale for Dyspnea  7- Severe shortness of breath or very hard breathing    Perceived Rate of Exertion (Borg)  17- Very hard      6 minute walk test results    Aerobic Endurance Distance Walked  625    Endurance additional comments  shuffling gait; limited to no arm swing and trunk rotation         Treatment:    TherEx   6 MWT  (625')  Marching with CW 3# 1x15; 2x20 bilaterally   Neuro Re-ed:    Step over orange hurdle forward and back 2x15 each LE; unilateral UE support.  Lateral step over hurdle and back 15x each LE; BUE support    Semi Tandem on foam with BUE support, EC x30sec with each foot in front    Pt demonstrates good motivation throughout today's session.  On his 6MWT, he was able to complete 634ft, displaying a shuffling gait pattern, as well as decreased trunk rotation and arm swing.  He displays some difficulty with stepping over a 6"  hurdle, displaying more difficulty with his L foot than his R, as well as stepping backwards.  He does demonstrate the ability to get foot clearance on both feet, but requires UE support for steadying.  Pt encourage to continue monitoring his BP while at home.  Pt will benefit from PT services to address deficits in strength, balance, and mobility in order to return to full function at home.       PT Short Term Goals - 02/12/19 1614      PT SHORT TERM GOAL #1   Title  Pt will be independent with HEP in order to improve strength and balance in order to decrease fall risk and improve function at home.    Time  6    Period  Weeks    Status  New    Target Date  03/26/19        PT Long Term Goals - 02/21/19 1435      PT LONG TERM GOAL #1   Title  Pt will improve BERG by at least 3 points in order to demonstrate clinically significant improvement in balance.    Baseline  02/12/19: 49/56    Time  12    Period  Weeks    Status  New    Target Date  05/07/19      PT LONG TERM GOAL #2   Title  Pt will improve DGI by at least 3 points in order to demonstrate clinically significant improvement in balance and decreased risk for falls.    Baseline  02/12/19: 20/24    Time  12    Period  Weeks    Status  New    Target Date  05/07/19      PT LONG TERM GOAL #3   Title  Pt will decrease 5TSTS by at least 3 seconds in order to demonstrate clinically significant improvement in LE strength.    Baseline  02/12/19: 19.0s    Time  12    Period  Weeks    Status  New    Target Date  05/07/19      PT LONG TERM GOAL #4   Title  Pt will improve ABC by at least 13% in order to demonstrate clinically significant improvement in balance confidence.  Baseline  02/12/19: 67.5%    Time  12    Period  Weeks    Status  New    Target Date  05/07/19      PT LONG TERM GOAL #5   Title  Pt will decrease TUG to below 14 seconds/decrease in order to demonstrate decreased fall risk.    Baseline  02/12/19: 20.4s     Time  12    Period  Weeks    Status  New    Target Date  05/07/19      Additional Long Term Goals   Additional Long Term Goals  Yes      PT LONG TERM GOAL #6   Title  Pt will increased 6MWT by at least 84m (18ft) in order to demonstrate clinically significant improvement in cardiopulmonary endurance and community ambulation.    Baseline  02/21/19: 615ft    Time  12    Period  Weeks    Status  New    Target Date  05/07/19            Plan - 02/21/19 1450    Clinical Impression Statement  Pt demonstrates good motivation throughout today's session.  On his 6MWT, he was able to complete 644ft, displaying a shuffling gait pattern, as well as decreased trunk rotation and arm swing.  He displays some difficulty with stepping over a 6" hurdle, displaying more difficulty with his L foot than his R, as well as stepping backwards.  He does demonstrate the ability to get foot clearance on both feet, but requires UE support for steadying.  Pt encourage to continue monitoring his BP while at home.  Pt will benefit from PT services to address deficits in strength, balance, and mobility in order to return to full function at home.    Personal Factors and Comorbidities  Age;Fitness;Comorbidity 1;Comorbidity 2;Time since onset of injury/illness/exacerbation    Comorbidities  Depression, CAD    Examination-Activity Limitations  Caring for Others;Squat;Stairs;Stand    Examination-Participation Restrictions  Cleaning;Community Activity;Yard Work;Shop    Stability/Clinical Decision Making  Evolving/Moderate complexity    Rehab Potential  Good    PT Frequency  2x / week    PT Duration  12 weeks    PT Treatment/Interventions  ADLs/Self Care Home Management;Aquatic Therapy;Biofeedback;Canalith Repostioning;Cryotherapy;Electrical Stimulation;Iontophoresis 4mg /ml Dexamethasone;Moist Heat;Traction;Ultrasound;DME Instruction;Gait training;Stair training;Functional mobility training;Therapeutic  activities;Therapeutic exercise;Balance training;Neuromuscular re-education;Cognitive remediation;Patient/family education;Manual techniques;Vestibular    PT Next Visit Plan  progress strength, balance, and dynamic gait    PT Home Exercise Plan  Medbridge Access Code: [email or web address] (Semitandem balance and sit to stand repeats)    Consulted and Agree with Plan of Care  Patient       Patient will benefit from skilled therapeutic intervention in order to improve the following deficits and impairments:  Abnormal gait, Cardiopulmonary status limiting activity, Decreased activity tolerance, Decreased balance, Decreased endurance, Decreased strength, Difficulty walking  Visit Diagnosis: 1. Unsteadiness on feet   2. Muscle weakness (generalized)   3. History of falling        Problem List Patient Active Problem List   Diagnosis Date Noted  . Malignant neoplastic disease ([place]) 04/28/2015  . Hypercholesterolemia 10/15/2014  . Essential (primary) hypertension 06/02/2014  . Acute blood loss anemia 01/25/2013  . Bloody pleural effusion 01/25/2013  . Pain following surgery or procedure 01/25/2013  . H/O aortic valve replacement 01/25/2013  . H/O coronary artery bypass surgery 01/25/2013  . History of open heart surgery 01/25/2013  . Angina, class II ([place])  01/22/2013  . Heart failure ([place]) 01/22/2013  . 3-vessel CAD 01/17/2013  . Aortic heart valve narrowing 01/17/2013     This entire session was performed under direct supervision and direction of a licensed therapist/therapist assistant . I have personally read, edited and approve of the note as written.    Lutricia Horsfall, SPT Phillips Grout PT, DPT, GCS  Huprich,Jason 02/22/2019, 1:44 PM  [place] MAIN [facility] SERVICES [address] [place], [place], [address] Phone: [phone]   Fax:  [phone]  Name: CAEDAN SUMLER MRN: [id number] Date of Birth: April 26, 1932

## 2019-02-26 ENCOUNTER — Other Ambulatory Visit: Payer: Self-pay

## 2019-02-26 ENCOUNTER — Ambulatory Visit: Payer: PPO

## 2019-02-26 VITALS — BP 91/61 | HR 67

## 2019-02-26 DIAGNOSIS — R2681 Unsteadiness on feet: Secondary | ICD-10-CM | POA: Diagnosis not present

## 2019-02-26 DIAGNOSIS — M6281 Muscle weakness (generalized): Secondary | ICD-10-CM

## 2019-02-26 NOTE — Therapy (Signed)
[place] MAIN [facility] SERVICES [address] [place], [place], [address] Phone: [phone]   Fax:  [phone]  Physical Therapy Treatment  Patient Details  Name: Bradley Nelson MRN: [id number] Date of Birth: September 30, 1931 Referring Provider (PT): Dr Velna Ochs   Encounter Date: 02/26/2019  PT End of Session - 02/26/19 1645    Visit Number  4    Number of Visits  25    Authorization Type  eval 02/12/19    Authorization Time Period  2/10 eval 7/7    PT Start Time  1430    PT Stop Time  1515    PT Time Calculation (min)  45 min    Equipment Utilized During Treatment  Gait belt    Activity Tolerance  Patient tolerated treatment well;Other (comment)   BP   Behavior During Therapy  WFL for tasks assessed/performed       Past Medical History:  Diagnosis Date  . Acute blood loss anemia 01/25/2013  . Angina, class II ([place]) 01/22/2013  . Aortic heart valve narrowing 01/17/2013  . Bloody pleural effusion 01/25/2013  . Essential (primary) hypertension 06/02/2014  . H/O aortic valve replacement 01/25/2013  . H/O coronary artery bypass surgery 01/25/2013   Overview:  cabg x3 with LIMA to LAD, SVG to PDA, SVG to left circumflex with septal muomectomy and aortic valve replacement with Ce Magna bioprosthetic valve on 01-24-13 by [name] at [facility].   . Heart failure ([place]) 01/22/2013  . History of open heart surgery 01/25/2013  . Hypercholesterolemia 10/15/2014  . Malignant neoplastic disease ([place]) 04/28/2015   Overview:  prostate cancer, s/p prostatectomy    . Prostate cancer ([place])   . Skin cancer     Past Surgical History:  Procedure Laterality Date  . AORTIC VALVE REPLACEMENT    . CARDIAC SURGERY    . CORONARY ARTERY BYPASS GRAFT    . HERNIA REPAIR    . PROSTATECTOMY  2001   Dr. [name]    Vitals:   02/26/19 1441  BP: 91/61  Pulse: 67  SpO2: 100%    Subjective Assessment - 02/26/19 1442    Subjective  Patient reports that he is doing okay  today.  He reports no pain or soreness today.  He reports no falls since last session.  He has no new questions or concerns at this point.    Pertinent History  Pt reports that he has been struggling with his balance since his wife passed approximately 1 year ago. He states that he has had "1-2 falls" in the last 12 months. Otherwise he denies any other known triggers or recent changes in his health. He has a significant cardiac history of CAD and artificial aortic valve with normal EF on last echo. He is followed by cardiology.    Patient Stated Goals  "Improve my balance"    Currently in Pain?  No/denies         TREATMENT       TherEx  Toe taps on 6" box with CW 2x20 bilaterally    STS without UE support 1x10; 2x20   Standing hip abduction 2x10 bilaterally       Neuro Re-ed   Step over obstacles (2 hurdles, 2 blocks) 25'x2 forward   Lateral stepping over obstacles x1 in each direction.  Difficulty keeping toes pointed forward.     Forward gait focusing on big steps and reciprocal arm swing 6x25' - demonstrated good technique for first 3-4 steps,  but lost technique throughout despite constant cueing and tactile cueing for arm swing   Lateral step over hurdle and back 15x each LE; BUE support        Pt demonstrates good motivation throughout today's session.  He displays significant difficulty with keeping his toes pointed forward during lateral stepping over obstacles, requiring cueing for technique.  With forward gait, he demonstrates decreased step and stride length with limited trunk rotation and arm swing. While focusing on big steps and reciprocal arm swing, he demonstrates good technique for first 3-4 steps, but loses technique throughout despite constant cueing and tactile cueing for arm swing. Pt will benefit from PT services to address deficits in strength, balance, and mobility in order to return to full function at home.      PT Short Term Goals - 02/12/19 1614       PT SHORT TERM GOAL #1   Title  Pt will be independent with HEP in order to improve strength and balance in order to decrease fall risk and improve function at home.    Time  6    Period  Weeks    Status  New    Target Date  03/26/19        PT Long Term Goals - 02/21/19 1435      PT LONG TERM GOAL #1   Title  Pt will improve BERG by at least 3 points in order to demonstrate clinically significant improvement in balance.    Baseline  02/12/19: 49/56    Time  12    Period  Weeks    Status  New    Target Date  05/07/19      PT LONG TERM GOAL #2   Title  Pt will improve DGI by at least 3 points in order to demonstrate clinically significant improvement in balance and decreased risk for falls.    Baseline  02/12/19: 20/24    Time  12    Period  Weeks    Status  New    Target Date  05/07/19      PT LONG TERM GOAL #3   Title  Pt will decrease 5TSTS by at least 3 seconds in order to demonstrate clinically significant improvement in LE strength.    Baseline  02/12/19: 19.0s    Time  12    Period  Weeks    Status  New    Target Date  05/07/19      PT LONG TERM GOAL #4   Title  Pt will improve ABC by at least 13% in order to demonstrate clinically significant improvement in balance confidence.    Baseline  02/12/19: 67.5%    Time  12    Period  Weeks    Status  New    Target Date  05/07/19      PT LONG TERM GOAL #5   Title  Pt will decrease TUG to below 14 seconds/decrease in order to demonstrate decreased fall risk.    Baseline  02/12/19: 20.4s    Time  12    Period  Weeks    Status  New    Target Date  05/07/19      Additional Long Term Goals   Additional Long Term Goals  Yes      PT LONG TERM GOAL #6   Title  Pt will increased 6MWT by at least 30m (146ft) in order to demonstrate clinically significant improvement in cardiopulmonary endurance and community ambulation.  Baseline  02/21/19: 620ft    Time  12    Period  Weeks    Status  New    Target Date  05/07/19             Plan - 02/26/19 1702    Clinical Impression Statement  Pt demonstrates good motivation throughout today's session.  He displays significant difficulty with keeping his toes pointed forward during lateral stepping over obstacles, requiring cueing for technique.  With forward gait, he demonstrates decreased step and stride length with limited trunk rotation and arm swing. While focusing on big steps and reciprocal arm swing, he demonstrates good technique for first 3-4 steps, but loses technique throughout despite constant cueing and tactile cueing for arm swing. Pt will benefit from PT services to address deficits in strength, balance, and mobility in order to return to full function at home.    Personal Factors and Comorbidities  Age;Fitness;Comorbidity 1;Comorbidity 2;Time since onset of injury/illness/exacerbation    Comorbidities  Depression, CAD    Examination-Activity Limitations  Caring for Others;Squat;Stairs;Stand    Examination-Participation Restrictions  Cleaning;Community Activity;Yard Work;Shop    Stability/Clinical Decision Making  Evolving/Moderate complexity    Rehab Potential  Good    PT Frequency  2x / week    PT Duration  12 weeks    PT Treatment/Interventions  ADLs/Self Care Home Management;Aquatic Therapy;Biofeedback;Canalith Repostioning;Cryotherapy;Electrical Stimulation;Iontophoresis 4mg /ml Dexamethasone;Moist Heat;Traction;Ultrasound;DME Instruction;Gait training;Stair training;Functional mobility training;Therapeutic activities;Therapeutic exercise;Balance training;Neuromuscular re-education;Cognitive remediation;Patient/family education;Manual techniques;Vestibular    PT Next Visit Plan  progress strength, balance, and dynamic gait; walking with poles for reciprocal arm swing; treadmill for biofeedback; agility ladder for steppage cueing    PT Home Exercise Plan  Medbridge Access Code: [email or web address] (Semitandem balance and sit to stand repeats)    Consulted and Agree  with Plan of Care  Patient       Patient will benefit from skilled therapeutic intervention in order to improve the following deficits and impairments:  Abnormal gait, Cardiopulmonary status limiting activity, Decreased activity tolerance, Decreased balance, Decreased endurance, Decreased strength, Difficulty walking  Visit Diagnosis: 1. Unsteadiness on feet   2. Muscle weakness (generalized)        Problem List Patient Active Problem List   Diagnosis Date Noted  . Malignant neoplastic disease ([place]) 04/28/2015  . Hypercholesterolemia 10/15/2014  . Essential (primary) hypertension 06/02/2014  . Acute blood loss anemia 01/25/2013  . Bloody pleural effusion 01/25/2013  . Pain following surgery or procedure 01/25/2013  . H/O aortic valve replacement 01/25/2013  . H/O coronary artery bypass surgery 01/25/2013  . History of open heart surgery 01/25/2013  . Angina, class II ([place]) 01/22/2013  . Heart failure ([place]) 01/22/2013  . 3-vessel CAD 01/17/2013  . Aortic heart valve narrowing 01/17/2013    This entire session was performed under direct supervision and direction of a licensed therapist/therapist assistant . I have personally read, edited and approve of the note as written.    Lutricia Horsfall, SPT Phillips Grout PT, DPT, GCS  Huprich,Jason 02/27/2019, 1:10 PM  [place] MAIN [facility] SERVICES [address] [place], [place], [address] Phone: [phone]   Fax:  [phone]  Name: Bradley Nelson MRN: [id number] Date of Birth: 1932-04-29

## 2019-02-28 ENCOUNTER — Other Ambulatory Visit: Payer: Self-pay

## 2019-02-28 ENCOUNTER — Ambulatory Visit: Payer: PPO

## 2019-02-28 VITALS — BP 90/62 | HR 65

## 2019-02-28 DIAGNOSIS — R2681 Unsteadiness on feet: Secondary | ICD-10-CM | POA: Diagnosis not present

## 2019-02-28 DIAGNOSIS — M6281 Muscle weakness (generalized): Secondary | ICD-10-CM

## 2019-02-28 DIAGNOSIS — R278 Other lack of coordination: Secondary | ICD-10-CM

## 2019-02-28 NOTE — Therapy (Signed)
[place] MAIN [facility] SERVICES [address] [place], [place], [address] Phone: [phone]   Fax:  [phone]  Physical Therapy Treatment  Patient Details  Name: Bradley Nelson MRN: [id number] Date of Birth: 07-01-1932 Referring Provider (PT): Dr Velna Ochs   Encounter Date: 02/28/2019  PT End of Session - 02/28/19 1443    Visit Number  5    Number of Visits  25    Authorization Type  eval 02/12/19    Authorization Time Period  2/10 eval 7/7    PT Start Time  1440    PT Stop Time  1525    PT Time Calculation (min)  45 min    Equipment Utilized During Treatment  Gait belt    Activity Tolerance  Patient tolerated treatment well;Other (comment)   BP   Behavior During Therapy  WFL for tasks assessed/performed       Past Medical History:  Diagnosis Date  . Acute blood loss anemia 01/25/2013  . Angina, class II ([place]) 01/22/2013  . Aortic heart valve narrowing 01/17/2013  . Bloody pleural effusion 01/25/2013  . Essential (primary) hypertension 06/02/2014  . H/O aortic valve replacement 01/25/2013  . H/O coronary artery bypass surgery 01/25/2013   Overview:  cabg x3 with LIMA to LAD, SVG to PDA, SVG to left circumflex with septal muomectomy and aortic valve replacement with Ce Magna bioprosthetic valve on 01-24-13 by [name] at [facility].   . Heart failure ([place]) 01/22/2013  . History of open heart surgery 01/25/2013  . Hypercholesterolemia 10/15/2014  . Malignant neoplastic disease ([place]) 04/28/2015   Overview:  prostate cancer, s/p prostatectomy    . Prostate cancer ([place])   . Skin cancer     Past Surgical History:  Procedure Laterality Date  . AORTIC VALVE REPLACEMENT    . CARDIAC SURGERY    . CORONARY ARTERY BYPASS GRAFT    . HERNIA REPAIR    . PROSTATECTOMY  2001   Dr. [name]    Vitals:   02/28/19 1445  BP: 90/62  Pulse: 65  SpO2: 100%    Subjective Assessment - 02/28/19 1443    Subjective  Patient reports that he is doing okay  today.  He reports no pain or soreness today.  He has no new questions or concerns at this point.    Pertinent History  Pt reports that he has been struggling with his balance since his wife passed approximately 1 year ago. He states that he has had "1-2 falls" in the last 12 months. Otherwise he denies any other known triggers or recent changes in his health. He has a significant cardiac history of CAD and artificial aortic valve with normal EF on last echo. He is followed by cardiology.    Patient Stated Goals  "Improve my balance"    Currently in Pain?  No/denies          Treatment:      TherEx     Toe taps on 6" box 3x10 bilaterally    Standing hip abduction 3x10 with 2# CW bilaterally   Marching 2# CW 3x10 bilaterally        Neuro Re-ed:      walking with poles for reciprocal arm swing x4   Forward gait without poles - fast x2, normal speed x3   Forward gait with horizontal head turns - x3 with markedly decreased gait speed and arm swing   Balance on foam, feet together, EO x1; EC x3;  30 sec each set.  Single leg stance attempted, but unable to maintain balance without touching down opposite foot.   Semitandem stance x30 bilaterally.     Pt educated throughout session about proper posture and technique with exercises. Improved exercise technique, movement at target joints, use of target muscles after min to mod verbal, visual, tactile cues.    Pt demonstrates excellent motivation during today's session.  He responded well to walking with the poles to facilitate reciprocal arm swing, that appeared to carry over in the short term with forward walking.  However, when a dual task of horizontal head turns was added into gait, his gait speed and arm swing significantly decreased.  Pt will benefit from PT services to address deficits in strength, balance, and mobility in order to return to full function at home.     PT Short Term Goals - 02/12/19 1614      PT SHORT TERM  GOAL #1   Title  Pt will be independent with HEP in order to improve strength and balance in order to decrease fall risk and improve function at home.    Time  6    Period  Weeks    Status  New    Target Date  03/26/19        PT Long Term Goals - 02/21/19 1435      PT LONG TERM GOAL #1   Title  Pt will improve BERG by at least 3 points in order to demonstrate clinically significant improvement in balance.    Baseline  02/12/19: 49/56    Time  12    Period  Weeks    Status  New    Target Date  05/07/19      PT LONG TERM GOAL #2   Title  Pt will improve DGI by at least 3 points in order to demonstrate clinically significant improvement in balance and decreased risk for falls.    Baseline  02/12/19: 20/24    Time  12    Period  Weeks    Status  New    Target Date  05/07/19      PT LONG TERM GOAL #3   Title  Pt will decrease 5TSTS by at least 3 seconds in order to demonstrate clinically significant improvement in LE strength.    Baseline  02/12/19: 19.0s    Time  12    Period  Weeks    Status  New    Target Date  05/07/19      PT LONG TERM GOAL #4   Title  Pt will improve ABC by at least 13% in order to demonstrate clinically significant improvement in balance confidence.    Baseline  02/12/19: 67.5%    Time  12    Period  Weeks    Status  New    Target Date  05/07/19      PT LONG TERM GOAL #5   Title  Pt will decrease TUG to below 14 seconds/decrease in order to demonstrate decreased fall risk.    Baseline  02/12/19: 20.4s    Time  12    Period  Weeks    Status  New    Target Date  05/07/19      Additional Long Term Goals   Additional Long Term Goals  Yes      PT LONG TERM GOAL #6   Title  Pt will increased 6MWT by at least 57m (141ft) in order to demonstrate clinically significant improvement  in cardiopulmonary endurance and community ambulation.    Baseline  02/21/19: 651ft    Time  12    Period  Weeks    Status  New    Target Date  05/07/19            Plan -  02/28/19 1541    Clinical Impression Statement  Pt demonstrates excellent motivation during today's session.  He responded well to walking with the poles to facilitate reciprocal arm swing, that appeared to carry over in the short term with forward walking.  However, when a dual task of horizontal head turns was added into gait, his gait speed and arm swing significantly decreased.  Pt will benefit from PT services to address deficits in strength, balance, and mobility in order to return to full function at home.    Personal Factors and Comorbidities  Age;Fitness;Comorbidity 1;Comorbidity 2;Time since onset of injury/illness/exacerbation    Comorbidities  Depression, CAD    Examination-Activity Limitations  Caring for Others;Squat;Stairs;Stand    Examination-Participation Restrictions  Cleaning;Community Activity;Yard Work;Shop    Stability/Clinical Decision Making  Evolving/Moderate complexity    Rehab Potential  Good    PT Frequency  2x / week    PT Duration  12 weeks    PT Treatment/Interventions  ADLs/Self Care Home Management;Aquatic Therapy;Biofeedback;Canalith Repostioning;Cryotherapy;Electrical Stimulation;Iontophoresis 4mg /ml Dexamethasone;Moist Heat;Traction;Ultrasound;DME Instruction;Gait training;Stair training;Functional mobility training;Therapeutic activities;Therapeutic exercise;Balance training;Neuromuscular re-education;Cognitive remediation;Patient/family education;Manual techniques;Vestibular    PT Next Visit Plan  progress strength, balance, and dynamic gait; walking with poles for reciprocal arm swing; treadmill for biofeedback; agility ladder for steppage cueing    PT Home Exercise Plan  Medbridge Access Code: [email or web address] (Semitandem balance and sit to stand repeats)    Consulted and Agree with Plan of Care  Patient       Patient will benefit from skilled therapeutic intervention in order to improve the following deficits and impairments:  Abnormal gait, Cardiopulmonary status  limiting activity, Decreased activity tolerance, Decreased balance, Decreased endurance, Decreased strength, Difficulty walking  Visit Diagnosis: 1. Unsteadiness on feet   2. Muscle weakness (generalized)   3. Other lack of coordination        Problem List Patient Active Problem List   Diagnosis Date Noted  . Malignant neoplastic disease ([place]) 04/28/2015  . Hypercholesterolemia 10/15/2014  . Essential (primary) hypertension 06/02/2014  . Acute blood loss anemia 01/25/2013  . Bloody pleural effusion 01/25/2013  . Pain following surgery or procedure 01/25/2013  . H/O aortic valve replacement 01/25/2013  . H/O coronary artery bypass surgery 01/25/2013  . History of open heart surgery 01/25/2013  . Angina, class II ([place]) 01/22/2013  . Heart failure ([place]) 01/22/2013  . 3-vessel CAD 01/17/2013  . Aortic heart valve narrowing 01/17/2013    This entire session was performed under direct supervision and direction of a licensed therapist/therapist assistant . I have personally read, edited and approve of the note as written.    Lutricia Horsfall, SPT Phillips Grout PT, DPT, GCS  Huprich,Jason 03/01/2019, 1:08 PM  [place] MAIN [facility] SERVICES [address] [place], [place], [address] Phone: [phone]   Fax:  [phone]  Name: MERICK KELLEHER MRN: [id number] Date of Birth: Nov 25, 1931

## 2019-03-05 ENCOUNTER — Other Ambulatory Visit: Payer: Self-pay

## 2019-03-05 ENCOUNTER — Ambulatory Visit: Payer: PPO

## 2019-03-05 DIAGNOSIS — M6281 Muscle weakness (generalized): Secondary | ICD-10-CM

## 2019-03-05 DIAGNOSIS — R2681 Unsteadiness on feet: Secondary | ICD-10-CM

## 2019-03-05 NOTE — Therapy (Signed)
[place] MAIN [facility] SERVICES [address] [place], [place], [address] Phone: [phone]   Fax:  [phone]  Physical Therapy Treatment  Patient Details  Name: Bradley Nelson MRN: [id number] Date of Birth: September 01, 1931 Referring Provider (Nelson): Dr Velna Ochs   Encounter Date: 03/05/2019  Nelson End of Session - 03/05/19 1558    Visit Number  6    Number of Visits  25    Authorization Type  eval 02/12/19    Authorization Time Period  2/10 eval 7/7    Nelson Start Time  1345    Nelson Stop Time  1430    Nelson Time Calculation (min)  45 min    Equipment Utilized During Treatment  Gait belt    Activity Tolerance  Patient tolerated treatment well;Other (comment)   BP   Behavior During Therapy  WFL for tasks assessed/performed       Past Medical History:  Diagnosis Date  . Acute blood loss anemia 01/25/2013  . Angina, class II ([place]) 01/22/2013  . Aortic heart valve narrowing 01/17/2013  . Bloody pleural effusion 01/25/2013  . Essential (primary) hypertension 06/02/2014  . H/O aortic valve replacement 01/25/2013  . H/O coronary artery bypass surgery 01/25/2013   Overview:  cabg x3 with LIMA to LAD, SVG to PDA, SVG to left circumflex with septal muomectomy and aortic valve replacement with Ce Magna bioprosthetic valve on 01-24-13 by [name] at [facility].   . Heart failure ([place]) 01/22/2013  . History of open heart surgery 01/25/2013  . Hypercholesterolemia 10/15/2014  . Malignant neoplastic disease ([place]) 04/28/2015   Overview:  prostate cancer, s/p prostatectomy    . Prostate cancer ([place])   . Skin cancer     Past Surgical History:  Procedure Laterality Date  . AORTIC VALVE REPLACEMENT    . CARDIAC SURGERY    . CORONARY ARTERY BYPASS GRAFT    . HERNIA REPAIR    . PROSTATECTOMY  2001   Dr. [name]    There were no vitals filed for this visit.  Subjective Assessment - 03/05/19 1356    Subjective  Patient reports that he is doing okay today.  He reports  that he had an episode of dizziness yesterday for approximately 5 minutes when standing up after getting out of the car. He has not had issues since and states that he has been drinking a lot of gatorade.  He states that his BP was low when he checked it the other day.  He has no new questions or concerns at this point.    Pertinent History  Nelson reports that he has been struggling with his balance since his wife passed approximately 1 year ago. He states that he has had "1-2 falls" in the last 12 months. Otherwise he denies any other known triggers or recent changes in his health. He has a significant cardiac history of CAD and artificial aortic valve with normal EF on last echo. He is followed by cardiology.    Patient Stated Goals  "Improve my balance"    Currently in Pain?  No/denies       Treatment:        Ther-ex    Orthostatic BP testing: supine 125/71, 98% SpO2, 57 bpm; sitting 111/70, 98% SpO2, 61 bpm; standing 106/62, 97% SpO2, 74 bpm.  Step ups onto 6" box 2x15   Standing hip abduction - penguins with theraband around feet 3x12   Canalith Repositioning: R and L Hallpike assessed, and  negative bilaterally.  R and L roll test assessed and negative bilaterally.    Neuromuscular Re-education    Agility ladder forward and backwards stepping x2 in each direction.  Difficulty with backwards, but showed improvements from 1st to 2nd set.    Nelson educated throughout session about proper posture and technique with exercises. Improved exercise technique, movement at target joints, use of target muscles after min to mod verbal, visual, tactile cues.        Nelson demonstrates excellent motivation during today's session.  He reported an episode of vertigo yesterday, so Dix-Hallpike, Roll Test, and Orthostatic BP testing were assessed.  His Hallpike and Roll test were negative bilaterally.  His orthostatic BP was 125/71 in supine, 111/70 in sitting, and 106/62 in standing.  He reported no symptoms  of dizziness today during testing or during session. Nelson reports that orthostatic dizziness and hypotension are chronic problems for him.  He demonstrates decreased gait speed and altered foot placement at a 45 deg angle with backwards walking, but was able to demonstrate improvements to maintain a retrogait pattern during the second set.  Nelson will benefit from Nelson services to address deficits in strength, balance, and mobility in order to return to full function at home.     Nelson Short Term Goals - 02/12/19 1614      Nelson SHORT TERM GOAL #1   Title  Nelson will be independent with HEP in order to improve strength and balance in order to decrease fall risk and improve function at home.    Time  6    Period  Weeks    Status  New    Target Date  03/26/19        Nelson Long Term Goals - 02/21/19 1435      Nelson LONG TERM GOAL #1   Title  Nelson will improve BERG by at least 3 points in order to demonstrate clinically significant improvement in balance.    Baseline  02/12/19: 49/56    Time  12    Period  Weeks    Status  New    Target Date  05/07/19      Nelson LONG TERM GOAL #2   Title  Nelson will improve DGI by at least 3 points in order to demonstrate clinically significant improvement in balance and decreased risk for falls.    Baseline  02/12/19: 20/24    Time  12    Period  Weeks    Status  New    Target Date  05/07/19      Nelson LONG TERM GOAL #3   Title  Nelson will decrease 5TSTS by at least 3 seconds in order to demonstrate clinically significant improvement in LE strength.    Baseline  02/12/19: 19.0s    Time  12    Period  Weeks    Status  New    Target Date  05/07/19      Nelson LONG TERM GOAL #4   Title  Nelson will improve ABC by at least 13% in order to demonstrate clinically significant improvement in balance confidence.    Baseline  02/12/19: 67.5%    Time  12    Period  Weeks    Status  New    Target Date  05/07/19      Nelson LONG TERM GOAL #5   Title  Nelson will decrease TUG to below 14 seconds/decrease in  order to demonstrate decreased fall risk.    Baseline  02/12/19:  20.4s    Time  12    Period  Weeks    Status  New    Target Date  05/07/19      Additional Long Term Goals   Additional Long Term Goals  Yes      Nelson LONG TERM GOAL #6   Title  Nelson will increased 6MWT by at least 68m (110ft) in order to demonstrate clinically significant improvement in cardiopulmonary endurance and community ambulation.    Baseline  02/21/19: 659ft    Time  12    Period  Weeks    Status  New    Target Date  05/07/19            Plan - 03/05/19 1558    Clinical Impression Statement  Nelson demonstrates excellent motivation during today's session.  He reported an episode of vertigo yesterday, so Dix-Hallpike, Roll Test, and Orthostatic BP testing were assessed.  His Hallpike and Roll test were negative bilaterally.  His orthostatic BP was 125/71 in supine, 111/70 in sitting, and 106/62 in standing.  He reported no symptoms of dizziness today during testing or during session. Nelson reports that orthostatic dizziness and hypotension are chronic problems for him.  He demonstrates decreased gait speed and altered foot placement at a 45 deg angle with backwards walking, but was able to demonstrate improvements to maintain a retrogait pattern during the second set.  Nelson will benefit from Nelson services to address deficits in strength, balance, and mobility in order to return to full function at home.    Personal Factors and Comorbidities  Age;Fitness;Comorbidity 1;Comorbidity 2;Time since onset of injury/illness/exacerbation    Comorbidities  Depression, CAD    Examination-Activity Limitations  Caring for Others;Squat;Stairs;Stand    Examination-Participation Restrictions  Cleaning;Community Activity;Yard Work;Shop    Stability/Clinical Decision Making  Evolving/Moderate complexity    Rehab Potential  Good    Nelson Frequency  2x / week    Nelson Duration  12 weeks    Nelson Treatment/Interventions  ADLs/Self Care Home Management;Aquatic  Therapy;Biofeedback;Canalith Repostioning;Cryotherapy;Electrical Stimulation;Iontophoresis 4mg /ml Dexamethasone;Moist Heat;Traction;Ultrasound;DME Instruction;Gait training;Stair training;Functional mobility training;Therapeutic activities;Therapeutic exercise;Balance training;Neuromuscular re-education;Cognitive remediation;Patient/family education;Manual techniques;Vestibular    Nelson Next Visit Plan  progress strength, balance, and dynamic gait; walking with poles for reciprocal arm swing; treadmill for biofeedback; agility ladder for steppage cueing    Nelson Home Exercise Plan  Medbridge Access Code: [email or web address] (Semitandem balance and sit to stand repeats)    Consulted and Agree with Plan of Care  Patient       Patient will benefit from skilled therapeutic intervention in order to improve the following deficits and impairments:  Abnormal gait, Cardiopulmonary status limiting activity, Decreased activity tolerance, Decreased balance, Decreased endurance, Decreased strength, Difficulty walking  Visit Diagnosis: 1. Unsteadiness on feet   2. Muscle weakness (generalized)        Problem List Patient Active Problem List   Diagnosis Date Noted  . Malignant neoplastic disease ([place]) 04/28/2015  . Hypercholesterolemia 10/15/2014  . Essential (primary) hypertension 06/02/2014  . Acute blood loss anemia 01/25/2013  . Bloody pleural effusion 01/25/2013  . Pain following surgery or procedure 01/25/2013  . H/O aortic valve replacement 01/25/2013  . H/O coronary artery bypass surgery 01/25/2013  . History of open heart surgery 01/25/2013  . Angina, class II ([place]) 01/22/2013  . Heart failure ([place]) 01/22/2013  . 3-vessel CAD 01/17/2013  . Aortic heart valve narrowing 01/17/2013    This entire session was performed under direct supervision and direction of a licensed therapist/therapist  assistant . I have personally read, edited and approve of the note as written.    Bradley Nelson, Bradley Nelson, Bradley Nelson, Bradley Nelson  Bradley Nelson,Bradley Nelson 03/06/2019, 9:41 AM  [place] MAIN [facility] SERVICES [address] [place], [place], [address] Phone: [phone]   Fax:  [phone]  Name: MERT DIETRICK MRN: [id number] Date of Birth: 1932/07/12

## 2019-03-07 ENCOUNTER — Other Ambulatory Visit: Payer: Self-pay

## 2019-03-07 ENCOUNTER — Ambulatory Visit: Payer: PPO

## 2019-03-07 VITALS — BP 89/59 | HR 71

## 2019-03-07 DIAGNOSIS — R2681 Unsteadiness on feet: Secondary | ICD-10-CM

## 2019-03-07 DIAGNOSIS — M6281 Muscle weakness (generalized): Secondary | ICD-10-CM

## 2019-03-07 NOTE — Therapy (Signed)
[place] MAIN [facility] SERVICES [address] [place], [place], [address] Phone: [phone]   Fax:  [phone]  Physical Therapy Treatment  Patient Details  Name: Bradley Nelson MRN: [id number] Date of Birth: 01-08-1932 Referring Provider (PT): Dr Velna Ochs   Encounter Date: 03/07/2019  PT End of Session - 03/08/19 1026    Visit Number  7    Number of Visits  25    Authorization Type  eval 02/12/19    Authorization Time Period  eval 7/7    PT Start Time  1345    PT Stop Time  1430    PT Time Calculation (min)  45 min    Equipment Utilized During Treatment  Gait belt    Activity Tolerance  Patient tolerated treatment well;Other (comment)   BP   Behavior During Therapy  WFL for tasks assessed/performed       Past Medical History:  Diagnosis Date  . Acute blood loss anemia 01/25/2013  . Angina, class II ([place]) 01/22/2013  . Aortic heart valve narrowing 01/17/2013  . Bloody pleural effusion 01/25/2013  . Essential (primary) hypertension 06/02/2014  . H/O aortic valve replacement 01/25/2013  . H/O coronary artery bypass surgery 01/25/2013   Overview:  cabg x3 with LIMA to LAD, SVG to PDA, SVG to left circumflex with septal muomectomy and aortic valve replacement with Ce Magna bioprosthetic valve on 01-24-13 by [name] at [facility].   . Heart failure ([place]) 01/22/2013  . History of open heart surgery 01/25/2013  . Hypercholesterolemia 10/15/2014  . Malignant neoplastic disease ([place]) 04/28/2015   Overview:  prostate cancer, s/p prostatectomy    . Prostate cancer ([place])   . Skin cancer     Past Surgical History:  Procedure Laterality Date  . AORTIC VALVE REPLACEMENT    . CARDIAC SURGERY    . CORONARY ARTERY BYPASS GRAFT    . HERNIA REPAIR    . PROSTATECTOMY  2001   Dr. [name]    Vitals:   03/07/19 1352  BP: (!) 89/59  Pulse: 71  SpO2: 99%    Subjective Assessment - 03/07/19 1352    Subjective  Patient reports that he is doing okay  today.  He reports no pain or soreness today.  He has no new questions or concerns at this point.    Pertinent History  Pt reports that he has been struggling with his balance since his wife passed approximately 1 year ago. He states that he has had "1-2 falls" in the last 12 months. Otherwise he denies any other known triggers or recent changes in his health. He has a significant cardiac history of CAD and artificial aortic valve with normal EF on last echo. He is followed by cardiology.    Patient Stated Goals  "Improve my balance"    Currently in Pain?  No/denies       Treatment:        Ther-ex     Step ups onto 6" box 2x10 2# CW     Standing hip abduction 2# CW 2x10 bilaterally    Standing hip extension 2# CW 2x10 bilaterally       Neuromuscular Re-education     Forward gait 75'x10 with cues to increase step length and arm swing.  Initially took 50 steps to get 75', able to get 42-45 consistently with cueing.   Narrow stance on foam, EO x30sec   Semi Tandem stance on foam, EO x30sec bilaterally, EC x30 sec  bilaterally.       Pt educated throughout session about proper posture and technique with exercises. Improved exercise technique, movement at target joints, use of target muscles after min to mod verbal, visual, tactile cues.       Pt demonstrates excellent motivation during today's session.  He responded well to cueing for attempting to get to the end of the hallway in as few steps as possible in order to increase his step length.  He initially took 50 steps to get 60' without cueing, but was able to consistently go 8' in 42-45 steps once cueing was provided.  He presents with some unsteadiness during steps ups, using intermittent UE support for steadying.  CGA provided throughout session for safety.  Pt will benefit from PT services to address deficits in strength, balance, and mobility in order to return to full function at home.      PT Short Term Goals - 02/12/19 1614       PT SHORT TERM GOAL #1   Title  Pt will be independent with HEP in order to improve strength and balance in order to decrease fall risk and improve function at home.    Time  6    Period  Weeks    Status  New    Target Date  03/26/19        PT Long Term Goals - 02/21/19 1435      PT LONG TERM GOAL #1   Title  Pt will improve BERG by at least 3 points in order to demonstrate clinically significant improvement in balance.    Baseline  02/12/19: 49/56    Time  12    Period  Weeks    Status  New    Target Date  05/07/19      PT LONG TERM GOAL #2   Title  Pt will improve DGI by at least 3 points in order to demonstrate clinically significant improvement in balance and decreased risk for falls.    Baseline  02/12/19: 20/24    Time  12    Period  Weeks    Status  New    Target Date  05/07/19      PT LONG TERM GOAL #3   Title  Pt will decrease 5TSTS by at least 3 seconds in order to demonstrate clinically significant improvement in LE strength.    Baseline  02/12/19: 19.0s    Time  12    Period  Weeks    Status  New    Target Date  05/07/19      PT LONG TERM GOAL #4   Title  Pt will improve ABC by at least 13% in order to demonstrate clinically significant improvement in balance confidence.    Baseline  02/12/19: 67.5%    Time  12    Period  Weeks    Status  New    Target Date  05/07/19      PT LONG TERM GOAL #5   Title  Pt will decrease TUG to below 14 seconds/decrease in order to demonstrate decreased fall risk.    Baseline  02/12/19: 20.4s    Time  12    Period  Weeks    Status  New    Target Date  05/07/19      Additional Long Term Goals   Additional Long Term Goals  Yes      PT LONG TERM GOAL #6   Title  Pt will increased 6MWT by at  least 108m (159ft) in order to demonstrate clinically significant improvement in cardiopulmonary endurance and community ambulation.    Baseline  02/21/19: 626ft    Time  12    Period  Weeks    Status  New    Target Date  05/07/19             Plan - 03/07/19 1447    Clinical Impression Statement  Pt demonstrates excellent motivation during today's session.  He responded well to cueing for attempting to get to the end of the hallway in as few steps as possible in order to increase his step length.  He initially took 50 steps to get 28' without cueing, but was able to consistently go 40' in 42-45 steps once cueing was provided.  He presents with some unsteadiness during steps ups, using intermittent UE support for steadying.  CGA provided throughout session for safety.  Pt will benefit from PT services to address deficits in strength, balance, and mobility in order to return to full function at home.    Personal Factors and Comorbidities  Age;Fitness;Comorbidity 1;Comorbidity 2;Time since onset of injury/illness/exacerbation    Comorbidities  Depression, CAD    Examination-Activity Limitations  Caring for Others;Squat;Stairs;Stand    Examination-Participation Restrictions  Cleaning;Community Activity;Yard Work;Shop    Stability/Clinical Decision Making  Evolving/Moderate complexity    Rehab Potential  Good    PT Frequency  2x / week    PT Duration  12 weeks    PT Treatment/Interventions  ADLs/Self Care Home Management;Aquatic Therapy;Biofeedback;Canalith Repostioning;Cryotherapy;Electrical Stimulation;Iontophoresis 4mg /ml Dexamethasone;Moist Heat;Traction;Ultrasound;DME Instruction;Gait training;Stair training;Functional mobility training;Therapeutic activities;Therapeutic exercise;Balance training;Neuromuscular re-education;Cognitive remediation;Patient/family education;Manual techniques;Vestibular    PT Next Visit Plan  increase step length, arm swing and cadence; progress strengthening and balance    PT Home Exercise Plan  Medbridge Access Code: [email or web address] (Semitandem balance and sit to stand repeats)    Consulted and Agree with Plan of Care  Patient       Patient will benefit from skilled therapeutic intervention in order  to improve the following deficits and impairments:  Abnormal gait, Cardiopulmonary status limiting activity, Decreased activity tolerance, Decreased balance, Decreased endurance, Decreased strength, Difficulty walking  Visit Diagnosis: 1. Unsteadiness on feet   2. Muscle weakness (generalized)        Problem List Patient Active Problem List   Diagnosis Date Noted  . Malignant neoplastic disease ([place]) 04/28/2015  . Hypercholesterolemia 10/15/2014  . Essential (primary) hypertension 06/02/2014  . Acute blood loss anemia 01/25/2013  . Bloody pleural effusion 01/25/2013  . Pain following surgery or procedure 01/25/2013  . H/O aortic valve replacement 01/25/2013  . H/O coronary artery bypass surgery 01/25/2013  . History of open heart surgery 01/25/2013  . Angina, class II ([place]) 01/22/2013  . Heart failure ([place]) 01/22/2013  . 3-vessel CAD 01/17/2013  . Aortic heart valve narrowing 01/17/2013    This entire session was performed under direct supervision and direction of a licensed therapist/therapist assistant . I have personally read, edited and approve of the note as written.   Lutricia Horsfall, SPT Phillips Grout PT, DPT, GCS  Huprich,Jason 03/08/2019, 10:28 AM  [place] MAIN [facility] SERVICES [address] [place], [place], [address] Phone: [phone]   Fax:  [phone]  Name: Bradley Nelson MRN: [id number] Date of Birth: 1931/10/07

## 2019-03-12 ENCOUNTER — Other Ambulatory Visit: Payer: Self-pay

## 2019-03-12 ENCOUNTER — Ambulatory Visit: Payer: PPO | Attending: Internal Medicine

## 2019-03-12 DIAGNOSIS — Z9181 History of falling: Secondary | ICD-10-CM | POA: Diagnosis not present

## 2019-03-12 DIAGNOSIS — R2681 Unsteadiness on feet: Secondary | ICD-10-CM | POA: Diagnosis not present

## 2019-03-12 DIAGNOSIS — M6281 Muscle weakness (generalized): Secondary | ICD-10-CM | POA: Diagnosis not present

## 2019-03-12 DIAGNOSIS — R278 Other lack of coordination: Secondary | ICD-10-CM | POA: Insufficient documentation

## 2019-03-12 NOTE — Therapy (Signed)
[place] MAIN [facility] SERVICES [address] [place], [place], [address] Phone: [phone]   Fax:  [phone]  Physical Therapy Treatment  Patient Details  Name: Bradley [place] MRN: [id number] Date of Birth: [birth year] Referring Provider (PT): Dr Velna Ochs   Encounter Date: 03/12/2019  PT End of Session - 03/12/19 1353    Visit Number  8    Number of Visits  25    Authorization Type  eval 02/12/19    Authorization Time Period  eval 7/7    PT Start Time  1346    PT Stop Time  1430    PT Time Calculation (min)  44 min    Equipment Utilized During Treatment  Gait belt    Activity Tolerance  Patient tolerated treatment well;Other (comment)   BP   Behavior During Therapy  WFL for tasks assessed/performed       Past Medical History:  Diagnosis Date  . Acute blood loss anemia 01/25/2013  . Angina, class II ([place]) 01/22/2013  . Aortic heart valve narrowing 01/17/2013  . Bloody pleural effusion 01/25/2013  . Essential (primary) hypertension 06/02/2014  . H/O aortic valve replacement 01/25/2013  . H/O coronary artery bypass surgery 01/25/2013   Overview:  cabg x3 with LIMA to LAD, SVG to PDA, SVG to left circumflex with septal muomectomy and aortic valve replacement with Ce Magna bioprosthetic valve on 01-24-13 by [name] at [facility].   . Heart failure ([place]) 01/22/2013  . History of open heart surgery 01/25/2013  . Hypercholesterolemia 10/15/2014  . Malignant neoplastic disease ([place]) 04/28/2015   Overview:  prostate cancer, s/p prostatectomy    . Prostate cancer ([place])   . Skin cancer     Past Surgical History:  Procedure Laterality Date  . AORTIC VALVE REPLACEMENT    . CARDIAC SURGERY    . CORONARY ARTERY BYPASS GRAFT    . HERNIA REPAIR    . PROSTATECTOMY  2001   Dr. [name]    There were no vitals filed for this visit.  Subjective Assessment - 03/12/19 1352    Subjective  Pt reports feeling okay today with no questions or concerns at  this time. He said he has been working on taking bigger steps at home since the last treatment session.    Pertinent History  Pt reports that he has been struggling with his balance since his wife passed approximately 1 year ago. He states that he has had "1-2 falls" in the last 12 months. Otherwise he denies any other known triggers or recent changes in his health. He has a significant cardiac history of CAD and artificial aortic valve with normal EF on last echo. He is followed by cardiology.    Patient Stated Goals  "Improve my balance"    Currently in Pain?  No/denies         TREATMENT    Therapeutic Exercise   NuStep L3 x 5 minutes during history  STS without UE support 2x10  6" Step ups forward without UE support x10 each     Neuromuscular Re-education    6" steps ups forward with airex on ground x10 each; posterior LOB x1  Standing airex feet together EO, EC with horizontal and vertical head turns x30s each  Standing tandem stance airex EO, EC x30s each  Standing airex toe taps on 6" step alt LE x10 each  Semitandem stance on airex without UE support alt LE 2x30s each  Standing airex ball pass  from PT to R shoulder and pass back on contralateral side for pelvic rotation x 5; repeated on L side  Forward gait 75'x8 with cues to increase step length and arm swing.  Initially took 45 steps to get 17', able to get 40-42 consistently with cueing.; attempted metronome gait for x2 lengths pt had difficulty with staying on beat Big retro step with bilateral open arms for pelvic rotation and big movements x5 each LE       Pt educated throughout session about proper posture and technique with exercises. Improved exercise technique, movement at target joints, use of target muscles after min to mod verbal, visual, tactile cues.        Pt demonstrates excellent motivation during today's session. Pt demonstrates difficulty with EC and head turning requiring intermittent UE support for  steadying and CGA-minA for safety. Posterior LOB occasionally and has a delayed reaction to reach out for safety bars requiring minA to help regain balance and steady himself. He presents with some unsteadiness during steps ups, using intermittent UE support for steadying. He has difficulty with large amplitude movements needing mod-max verbal cueing for big steps during gait in hallway. He demonstrates stride length improvement since last session and reports noticing a difference at home. It currently takes 80 steps for him to get to his mailbox and as a HEP he has been working on trying to decrease that number. Pt demonstrates difficulty with pelvic rotation during exercise requiring max verbal cueing, however still has difficulty with the movement. CGA provided throughout session for safety. Pt will benefit from PT services to address deficits in strength, balance, and mobility in order to return to full function at home.        PT Education - 03/12/19 1353    Education Details  technique and form throughout session    Person(s) Educated  Patient    Methods  Explanation;Tactile cues;Demonstration;Verbal cues    Comprehension  Verbal cues required;Returned demonstration;Verbalized understanding       PT Short Term Goals - 02/12/19 1614      PT SHORT TERM GOAL #1   Title  Pt will be independent with HEP in order to improve strength and balance in order to decrease fall risk and improve function at home.    Time  6    Period  Weeks    Status  New    Target Date  03/26/19        PT Long Term Goals - 02/21/19 1435      PT LONG TERM GOAL #1   Title  Pt will improve BERG by at least 3 points in order to demonstrate clinically significant improvement in balance.    Baseline  02/12/19: 49/56    Time  12    Period  Weeks    Status  New    Target Date  05/07/19      PT LONG TERM GOAL #2   Title  Pt will improve DGI by at least 3 points in order to demonstrate clinically significant  improvement in balance and decreased risk for falls.    Baseline  02/12/19: 20/24    Time  12    Period  Weeks    Status  New    Target Date  05/07/19      PT LONG TERM GOAL #3   Title  Pt will decrease 5TSTS by at least 3 seconds in order to demonstrate clinically significant improvement in LE strength.    Baseline  02/12/19: 19.0s    Time  12    Period  Weeks    Status  New    Target Date  05/07/19      PT LONG TERM GOAL #4   Title  Pt will improve ABC by at least 13% in order to demonstrate clinically significant improvement in balance confidence.    Baseline  02/12/19: 67.5%    Time  12    Period  Weeks    Status  New    Target Date  05/07/19      PT LONG TERM GOAL #5   Title  Pt will decrease TUG to below 14 seconds/decrease in order to demonstrate decreased fall risk.    Baseline  02/12/19: 20.4s    Time  12    Period  Weeks    Status  New    Target Date  05/07/19      Additional Long Term Goals   Additional Long Term Goals  Yes      PT LONG TERM GOAL #6   Title  Pt will increased 6MWT by at least 10m (162ft) in order to demonstrate clinically significant improvement in cardiopulmonary endurance and community ambulation.    Baseline  02/21/19: 612ft    Time  12    Period  Weeks    Status  New    Target Date  05/07/19            Plan - 03/12/19 1353    Clinical Impression Statement  Pt demonstrates excellent motivation during today's session. Pt demonstrates difficulty with EC and head turning requiring intermittent UE support for steadying and CGA-minA for safety. Posterior LOB occasionally and has a delayed reaction to reach out for safety bars requiring minA to help regain balance and steady himself. He presents with some unsteadiness during steps ups, using intermittent UE support for steadying. He has difficulty with large amplitude movements needing mod-max verbal cueing for big steps during gait in hallway. He demonstrates stride length improvement since last  session and reports noticing a difference at home. It currently takes 80 steps for him to get to his mailbox and as a HEP he has been working on trying to decrease that number. Pt demonstrates difficulty with pelvic rotation during exercise requiring max verbal cueing, however still has difficulty with the movement. CGA provided throughout session for safety. Pt will benefit from PT services to address deficits in strength, balance, and mobility in order to return to full function at home.    Personal Factors and Comorbidities  Age;Fitness;Comorbidity 1;Comorbidity 2;Time since onset of injury/illness/exacerbation    Comorbidities  Depression, CAD    Examination-Activity Limitations  Caring for Others;Squat;Stairs;Stand    Examination-Participation Restrictions  Cleaning;Community Activity;Yard Work;Shop    Stability/Clinical Decision Making  Evolving/Moderate complexity    Rehab Potential  Good    PT Frequency  2x / week    PT Duration  12 weeks    PT Treatment/Interventions  ADLs/Self Care Home Management;Aquatic Therapy;Biofeedback;Canalith Repostioning;Cryotherapy;Electrical Stimulation;Iontophoresis 4mg /ml Dexamethasone;Moist Heat;Traction;Ultrasound;DME Instruction;Gait training;Stair training;Functional mobility training;Therapeutic activities;Therapeutic exercise;Balance training;Neuromuscular re-education;Cognitive remediation;Patient/family education;Manual techniques;Vestibular    PT Next Visit Plan  increase step length, arm swing and cadence; progress strengthening and balance    PT Home Exercise Plan  Medbridge Access Code: [email or web address] (Semitandem balance and sit to stand repeats)    Consulted and Agree with Plan of Care  Patient       Patient will benefit from skilled therapeutic intervention in order to improve the following deficits  and impairments:  Abnormal gait, Cardiopulmonary status limiting activity, Decreased activity tolerance, Decreased balance, Decreased endurance, Decreased  strength, Difficulty walking  Visit Diagnosis: 1. Unsteadiness on feet   2. Muscle weakness (generalized)   3. History of falling        Problem List Patient Active Problem List   Diagnosis Date Noted  . Malignant neoplastic disease ([place]) 04/28/2015  . Hypercholesterolemia 10/15/2014  . Essential (primary) hypertension 06/02/2014  . Acute blood loss anemia 01/25/2013  . Bloody pleural effusion 01/25/2013  . Pain following surgery or procedure 01/25/2013  . H/O aortic valve replacement 01/25/2013  . H/O coronary artery bypass surgery 01/25/2013  . History of open heart surgery 01/25/2013  . Angina, class II ([place]) 01/22/2013  . Heart failure ([place]) 01/22/2013  . 3-vessel CAD 01/17/2013  . Aortic heart valve narrowing 01/17/2013    This entire session was performed under direct supervision and direction of a licensed therapist/therapist assistant . I have personally read, edited and approve of the note as written.    Bradley Nelson  Phillips Grout PT, DPT, GCS  Bradley Nelson,Bradley Nelson 03/13/2019, 9:08 AM  [place] MAIN [facility] SERVICES [address] [place], [place], [address] Phone: [phone]   Fax:  [phone]  Name: Bradley Nelson MRN: [id number] Date of Birth: 02/26/1932

## 2019-03-14 ENCOUNTER — Other Ambulatory Visit: Payer: Self-pay

## 2019-03-14 ENCOUNTER — Ambulatory Visit: Payer: PPO

## 2019-03-14 VITALS — BP 97/63 | HR 70

## 2019-03-14 DIAGNOSIS — M6281 Muscle weakness (generalized): Secondary | ICD-10-CM

## 2019-03-14 DIAGNOSIS — R2681 Unsteadiness on feet: Secondary | ICD-10-CM

## 2019-03-14 NOTE — Therapy (Signed)
[place] [facility] SERVICES [address] [place], [place], [address] Phone: [phone]   Fax:  [phone]  Physical Therapy Treatment  Patient Details  Name: Bradley Nelson MRN: [id number] Date of Birth: October 10, 1931 Referring Provider (PT): Dr Velna Ochs   Encounter Date: 03/14/2019  PT End of Session - 03/14/19 1718    Visit Number  9    Number of Visits  25    Authorization Type  eval 02/12/19    Authorization Time Period  eval 7/7    PT Start Time  1434    PT Stop Time  1515    PT Time Calculation (min)  41 min    Equipment Utilized During Treatment  Gait belt    Activity Tolerance  Patient tolerated treatment well;Other (comment)   BP   Behavior During Therapy  WFL for tasks assessed/performed       Past Medical History:  Diagnosis Date  . Acute blood loss anemia 01/25/2013  . Angina, class II ([place]) 01/22/2013  . Aortic heart valve narrowing 01/17/2013  . Bloody pleural effusion 01/25/2013  . Essential (primary) hypertension 06/02/2014  . H/O aortic valve replacement 01/25/2013  . H/O coronary artery bypass surgery 01/25/2013   Overview:  cabg x3 with LIMA to LAD, SVG to PDA, SVG to left circumflex with septal muomectomy and aortic valve replacement with Ce Magna bioprosthetic valve on 01-24-13 by [name] at [facility].   . Heart failure ([place]) 01/22/2013  . History of open heart surgery 01/25/2013  . Hypercholesterolemia 10/15/2014  . Malignant neoplastic disease ([place]) 04/28/2015   Overview:  prostate cancer, s/p prostatectomy    . Prostate cancer ([place])   . Skin cancer     Past Surgical History:  Procedure Laterality Date  . AORTIC VALVE REPLACEMENT    . CARDIAC SURGERY    . CORONARY ARTERY BYPASS GRAFT    . HERNIA REPAIR    . PROSTATECTOMY  2001   Dr. [name]    Vitals:   03/14/19 1440  BP: 97/63  Pulse: 70  SpO2: 96%    Subjective Assessment - 03/14/19 1436    Subjective  Pt reports feeling okay today with no  questions or concerns at this time. He states there are 52 steps to his mailbox on a gravel driveway, and he as been working on taking big steps.  He states that it takes him 60 steps to get around his house, and will work on decreasing the number of steps required to get around the house, focusing on big steps.  He has no new questions or concerns at this time.    Pertinent History  Pt reports that he has been struggling with his balance since his wife passed approximately 1 year ago. He states that he has had "1-2 falls" in the last 12 months. Otherwise he denies any other known triggers or recent changes in his health. He has a significant cardiac history of CAD and artificial aortic valve with normal EF on last echo. He is followed by cardiology.    Patient Stated Goals  "Improve my balance"    Currently in Pain?  No/denies         TREATMENT    Therapeutic Exercise     STS without UE support 3x15    6" Step ups forward without UE support x10 each     Penguins (hip abduction) with UE support red band 2x10 in each direction; cues to keep toes facing forward  Neuromuscular Re-education      Semi-tandem stance on airex without UE support alt LE  x30s each    Forward gait 75'x6 with cues to increase step length and arm swing.  Initially took 45 steps to get 80', able to get 26 consistently with cueing , able to get 40 steps on his final 85'    Pt educated throughout session about proper posture and technique with exercises. Improved exercise technique, movement at target joints, use of target muscles after min to mod verbal, visual, tactile cues.          Pt demonstrates excellent motivation during today's session. With walking 75', he demonstrates a good step length during the first half, but then begins to shorten his stride during the last 20'-30'.  He initially took 45 steps today to go 63', with the ability to consistently take 42 steps for the next 4 reps, and decreasing to 40  steps for the final 75'.  He has increased his STS endurance, being able to complete 3x15 today without the use of his UEs.  With hip abduction, he requiring moderate cueing initially to keep his toes facing forward, with faded cueing throughout.  He demonstrated difficulty with 6" step ups, with occasional stumbling due to decrease hip flexion and toe clearance intermittently.  CGA provided throughout session for safety with intermittent minA for steadying. Pt will benefit from PT services to address deficits in strength, balance, and mobility in order to return to full function at home.       PT Short Term Goals - 02/12/19 1614      PT SHORT TERM GOAL #1   Title  Pt will be independent with HEP in order to improve strength and balance in order to decrease fall risk and improve function at home.    Time  6    Period  Weeks    Status  New    Target Date  03/26/19        PT Long Term Goals - 02/21/19 1435      PT LONG TERM GOAL #1   Title  Pt will improve BERG by at least 3 points in order to demonstrate clinically significant improvement in balance.    Baseline  02/12/19: 49/56    Time  12    Period  Weeks    Status  New    Target Date  05/07/19      PT LONG TERM GOAL #2   Title  Pt will improve DGI by at least 3 points in order to demonstrate clinically significant improvement in balance and decreased risk for falls.    Baseline  02/12/19: 20/24    Time  12    Period  Weeks    Status  New    Target Date  05/07/19      PT LONG TERM GOAL #3   Title  Pt will decrease 5TSTS by at least 3 seconds in order to demonstrate clinically significant improvement in LE strength.    Baseline  02/12/19: 19.0s    Time  12    Period  Weeks    Status  New    Target Date  05/07/19      PT LONG TERM GOAL #4   Title  Pt will improve ABC by at least 13% in order to demonstrate clinically significant improvement in balance confidence.    Baseline  02/12/19: 67.5%    Time  12    Period  Weeks  Status  New    Target Date  05/07/19      PT LONG TERM GOAL #5   Title  Pt will decrease TUG to below 14 seconds/decrease in order to demonstrate decreased fall risk.    Baseline  02/12/19: 20.4s    Time  12    Period  Weeks    Status  New    Target Date  05/07/19      Additional Long Term Goals   Additional Long Term Goals  Yes      PT LONG TERM GOAL #6   Title  Pt will increased 6MWT by at least 24m (141ft) in order to demonstrate clinically significant improvement in cardiopulmonary endurance and community ambulation.    Baseline  02/21/19: 642ft    Time  12    Period  Weeks    Status  New    Target Date  05/07/19            Plan - 03/14/19 1727    Clinical Impression Statement  Pt demonstrates excellent motivation during today's session. With walking 75', he demonstrates a good step length during the first half, but then begins to shorten his stride during the last 20'-30'.  He initially took 45 steps today to go 61', with the ability to consistently take 42 steps for the next 4 reps, and decreasing to 40 steps for the final 75'.  He has increased his STS endurance, being able to complete 3x15 today without the use of his UEs.  With hip abduction, he requiring moderate cueing initially to keep his toes facing forward, with faded cueing throughout.  He demonstrated difficulty with 6" step ups, with occasional stumbling due to decrease hip flexion and toe clearance intermittently.  CGA provided throughout session for safety with intermittent minA for steadying. Pt will benefit from PT services to address deficits in strength, balance, and mobility in order to return to full function at home.    Personal Factors and Comorbidities  Age;Fitness;Comorbidity 1;Comorbidity 2;Time since onset of injury/illness/exacerbation    Comorbidities  Depression, CAD    Examination-Activity Limitations  Caring for Others;Squat;Stairs;Stand    Examination-Participation Restrictions  Cleaning;Community  Activity;Yard Work;Shop    Stability/Clinical Decision Making  Evolving/Moderate complexity    Rehab Potential  Good    PT Frequency  2x / week    PT Duration  12 weeks    PT Treatment/Interventions  ADLs/Self Care Home Management;Aquatic Therapy;Biofeedback;Canalith Repostioning;Cryotherapy;Electrical Stimulation;Iontophoresis 4mg /ml Dexamethasone;Moist Heat;Traction;Ultrasound;DME Instruction;Gait training;Stair training;Functional mobility training;Therapeutic activities;Therapeutic exercise;Balance training;Neuromuscular re-education;Cognitive remediation;Patient/family education;Manual techniques;Vestibular    PT Next Visit Plan  increase step length, arm swing and cadence; progress strengthening and balance    PT Home Exercise Plan  Medbridge Access Code: [email or web address] (Semitandem balance and sit to stand repeats)    Consulted and Agree with Plan of Care  Patient       Patient will benefit from skilled therapeutic intervention in order to improve the following deficits and impairments:  Abnormal gait, Cardiopulmonary status limiting activity, Decreased activity tolerance, Decreased balance, Decreased endurance, Decreased strength, Difficulty walking  Visit Diagnosis: 1. Unsteadiness on feet   2. Muscle weakness (generalized)        Problem List Patient Active Problem List   Diagnosis Date Noted  . Malignant neoplastic disease ([place]) 04/28/2015  . Hypercholesterolemia 10/15/2014  . Essential (primary) hypertension 06/02/2014  . Acute blood loss anemia 01/25/2013  . Bloody pleural effusion 01/25/2013  . Pain following surgery or procedure 01/25/2013  . H/O aortic valve  replacement 01/25/2013  . H/O coronary artery bypass surgery 01/25/2013  . History of open heart surgery 01/25/2013  . Angina, class II ([place]) 01/22/2013  . Heart failure ([place]) 01/22/2013  . 3-vessel CAD 01/17/2013  . Aortic heart valve narrowing 01/17/2013   This entire session was performed under the direct  supervision of a liscensed physical therapist. Read, reviewed,  edited and agree with student's findings and recommendations.    Lutricia Horsfall, SPT  9:26 AM, 03/15/19 Etta Grandchild, PT, DPT Physical Therapist - [facility]  Outpatient Physical Therapy- [place] [phone]     Etta Grandchild 03/15/2019, 9:24 AM  [place] [facility] SERVICES [address] [place], [place], [address] Phone: [phone]   Fax:  [phone]  Name: CODY ALBUS MRN: [id number] Date of Birth: 12/14/1931

## 2019-03-19 ENCOUNTER — Ambulatory Visit: Payer: PPO

## 2019-03-19 ENCOUNTER — Other Ambulatory Visit: Payer: Self-pay

## 2019-03-19 DIAGNOSIS — Z9181 History of falling: Secondary | ICD-10-CM

## 2019-03-19 DIAGNOSIS — R2681 Unsteadiness on feet: Secondary | ICD-10-CM | POA: Diagnosis not present

## 2019-03-19 DIAGNOSIS — M6281 Muscle weakness (generalized): Secondary | ICD-10-CM

## 2019-03-19 NOTE — Therapy (Signed)
[place] MAIN [facility] SERVICES [address] [place], [place], [address] Phone: [phone]   Fax:  [phone]  Physical Therapy Treatment Physical Therapy Progress Note   Dates of reporting period  02/12/19   to   03/19/19   Patient Details  Name: Bradley Nelson MRN: [id number] Date of Birth: 12-13-1931 Referring Provider (PT): Dr Velna Ochs   Encounter Date: 03/19/2019  PT End of Session - 03/19/19 1437    Visit Number  10    Number of Visits  25    Date for PT Re-Evaluation  05/07/19    Authorization Type  eval 02/12/19    Authorization Time Period  eval 7/7    PT Start Time  1430    PT Stop Time  1515    PT Time Calculation (min)  45 min    Equipment Utilized During Treatment  Gait belt    Activity Tolerance  Patient tolerated treatment well;Other (comment)   BP   Behavior During Therapy  WFL for tasks assessed/performed       Past Medical History:  Diagnosis Date  . Acute blood loss anemia 01/25/2013  . Angina, class II ([place]) 01/22/2013  . Aortic heart valve narrowing 01/17/2013  . Bloody pleural effusion 01/25/2013  . Essential (primary) hypertension 06/02/2014  . H/O aortic valve replacement 01/25/2013  . H/O coronary artery bypass surgery 01/25/2013   Overview:  cabg x3 with LIMA to LAD, SVG to PDA, SVG to left circumflex with septal muomectomy and aortic valve replacement with Ce Magna bioprosthetic valve on 01-24-13 by [name] at [facility].   . Heart failure ([place]) 01/22/2013  . History of open heart surgery 01/25/2013  . Hypercholesterolemia 10/15/2014  . Malignant neoplastic disease ([place]) 04/28/2015   Overview:  prostate cancer, s/p prostatectomy    . Prostate cancer ([place])   . Skin cancer     Past Surgical History:  Procedure Laterality Date  . AORTIC VALVE REPLACEMENT    . CARDIAC SURGERY    . CORONARY ARTERY BYPASS GRAFT    . HERNIA REPAIR    . PROSTATECTOMY  2001   Dr. [name]    There were no vitals filed for this  visit.  Subjective Assessment - 03/19/19 1435    Subjective  Pt reports feeling fatigued today, reporting that he's been feeling low energy the last few days.  He reports a bout of dizziness over the weekend, leaving him feeling unsteady.  He reports no pain or soreness today, and has no questions or concerns at this time.    Pertinent History  Pt reports that he has been struggling with his balance since his wife passed approximately 1 year ago. He states that he has had "1-2 falls" in the last 12 months. Otherwise he denies any other known triggers or recent changes in his health. He has a significant cardiac history of CAD and artificial aortic valve with normal EF on last echo. He is followed by cardiology.    Patient Stated Goals  "Improve my balance"    Currently in Pain?  No/denies      Treatment:      Reassessed:    ABC: 72.5%    BERG:  52/56   DGI: 20/24   5xSTS:  17.15sec   TUG: 16.25sec  6MWT:  Deferred to next session due to fatigue         Pt educated throughout session about proper posture and technique with exercises. Improved exercise technique, movement  at target joints, use of target muscles after min to mod verbal, visual, tactile cues.            Pt continues to demonstrate excellent motivation throughout PT sessions.  He has met 1 STG and 1 LTG, and has made progress towards all of his other LTGs.  He has improved his 5xSTS from 19s to 17.1s, improved his TUG from 20.4s to 16.25s, improved his BERG from 49/56 to a 52/56, and improved his ABC 67.5% to a 72.5%.  His DGI remained at a 20/24, and his 6MWT was deferred to next visit due to fatigue upon arrival. Pt will benefit from PT services to address deficits in strength, balance, and mobility in order to return to full function at home.      [facility] PT Assessment - 03/19/19 1442      Observation/Other Assessments   Activities of Balance Confidence Scale (ABC Scale)   72.5%      Standardized Balance  Assessment   Five times sit to stand comments   16.15sec      Berg Balance Test   Sit to Stand  Able to stand without using hands and stabilize independently    Standing Unsupported  Able to stand safely 2 minutes    Sitting with Back Unsupported but Feet Supported on Floor or Stool  Able to sit safely and securely 2 minutes    Stand to Sit  Sits safely with minimal use of hands    Transfers  Able to transfer safely, minor use of hands    Standing Unsupported with Eyes Closed  Able to stand 10 seconds safely    Standing Unsupported with Feet Together  Able to place feet together independently and stand 1 minute safely    From Standing, Reach Forward with Outstretched Arm  Can reach confidently >25 cm (10")    From Standing Position, Pick up Object from Floor  Able to pick up shoe safely and easily    From Standing Position, Turn to Look Behind Over each Shoulder  Looks behind one side only/other side shows less weight shift   difficulty turning to look over R shoulder with < trunk rot   Turn 360 Degrees  Able to turn 360 degrees safely one side only in 4 seconds or less    Standing Unsupported, Alternately Place Feet on Step/Stool  Able to stand independently and safely and complete 8 steps in 20 seconds    Standing Unsupported, One Foot in Front  Able to plae foot ahead of the other independently and hold 30 seconds    Standing on One Leg  Able to lift leg independently and hold 5-10 seconds    Total Score  52      Dynamic Gait Index   Level Surface  Mild Impairment    Change in Gait Speed  Moderate Impairment    Gait with Horizontal Head Turns  Normal    Gait with Vertical Head Turns  Normal    Gait and Pivot Turn  Normal    Step Over Obstacle  Normal    Step Around Obstacles  Normal    Steps  Mild Impairment    Total Score  20      Timed Up and Go Test   TUG  Normal TUG    Normal TUG (seconds)  16.25            PT Short Term Goals - 03/19/19 1720      PT SHORT TERM  GOAL #1   Title  Pt will be independent with HEP in order to improve strength and balance in order to decrease fall risk and improve function at home.    Time  6    Period  Weeks    Status  Achieved    Target Date  03/26/19        PT Long Term Goals - 03/19/19 1720      PT LONG TERM GOAL #1   Title  Pt will improve BERG by at least 3 points in order to demonstrate clinically significant improvement in balance.    Baseline  02/12/19: 49/56; 03/19/19: 52/56    Time  12    Period  Weeks    Status  Achieved    Target Date  05/07/19      PT LONG TERM GOAL #2   Title  Pt will improve DGI by at least 3 points in order to demonstrate clinically significant improvement in balance and decreased risk for falls.    Baseline  02/12/19: 20/24; 03/19/19: 20/24    Time  12    Period  Weeks    Status  Partially Met    Target Date  05/07/19      PT LONG TERM GOAL #3   Title  Pt will decrease 5TSTS by at least 3 seconds in order to demonstrate clinically significant improvement in LE strength.    Baseline  02/12/19: 19.0s; 03/19/19: 17.15sec    Time  12    Period  Weeks    Status  Partially Met    Target Date  05/07/19      PT LONG TERM GOAL #4   Title  Pt will improve ABC by at least 13% in order to demonstrate clinically significant improvement in balance confidence.    Baseline  02/12/19: 67.5%; 03/19/19: 72.5%    Time  12    Period  Weeks    Status  Partially Met    Target Date  05/07/19      PT LONG TERM GOAL #5   Title  Pt will decrease TUG to below 14 seconds/decrease in order to demonstrate decreased fall risk.    Baseline  02/12/19: 20.4s; 03/19/19: 16.25s    Time  12    Period  Weeks    Status  Partially Met    Target Date  05/07/19      PT LONG TERM GOAL #6   Title  Pt will increased 6MWT by at least 73m(1673f in order to demonstrate clinically significant improvement in cardiopulmonary endurance and community ambulation.    Baseline  02/21/19: 62527f  Time  12    Period  Weeks     Status  Deferred    Target Date  05/07/19            Plan - 03/20/19 [id number] Clinical Impression Statement  Pt continues to demonstrate excellent motivation throughout PT sessions.  He has met 1 STG and 1 LTG, and has made progress towards all of his other LTGs.  He has improved his 5xSTS from 19s to 17.1s, improved his TUG from 20.4s to 16.25s, improved his BERG from 49/56 to a 52/56, and improved his ABC 67.5% to a 72.5%.  His DGI remained at a 20/24, and his 6MWT was deferred to next visit due to fatigue upon arrival. Pt will benefit from PT services to address deficits in strength, balance, and mobility in order to return to full function at  home    Personal Factors and Comorbidities  Age;Fitness;Comorbidity 1;Comorbidity 2;Time since onset of injury/illness/exacerbation    Comorbidities  Depression, CAD    Examination-Activity Limitations  Caring for Others;Squat;Stairs;Stand    Examination-Participation Restrictions  Cleaning;Community Activity;Yard Work;Shop    Stability/Clinical Decision Making  Evolving/Moderate complexity    Rehab Potential  Good    PT Frequency  2x / week    PT Duration  12 weeks    PT Treatment/Interventions  ADLs/Self Care Home Management;Aquatic Therapy;Biofeedback;Canalith Repostioning;Cryotherapy;Electrical Stimulation;Iontophoresis 26m/ml Dexamethasone;Moist Heat;Traction;Ultrasound;DME Instruction;Gait training;Stair training;Functional mobility training;Therapeutic activities;Therapeutic exercise;Balance training;Neuromuscular re-education;Cognitive remediation;Patient/family education;Manual techniques;Vestibular    PT Next Visit Plan  reassess 6MWT; increase step length, arm swing and cadence; progress strengthening and balance    PT Home Exercise Plan  Medbridge Access Code: [email or web address] (Semitandem balance and sit to stand repeats)    Consulted and Agree with Plan of Care  Patient       Patient will benefit from skilled therapeutic intervention in order  to improve the following deficits and impairments:  Abnormal gait, Cardiopulmonary status limiting activity, Decreased activity tolerance, Decreased balance, Decreased endurance, Decreased strength, Difficulty walking  Visit Diagnosis: 1. Unsteadiness on feet   2. Muscle weakness (generalized)   3. History of falling        Problem List Patient Active Problem List   Diagnosis Date Noted  . Malignant neoplastic disease ([place] 04/28/2015  . Hypercholesterolemia 10/15/2014  . Essential (primary) hypertension 06/02/2014  . Acute blood loss anemia 01/25/2013  . Bloody pleural effusion 01/25/2013  . Pain following surgery or procedure 01/25/2013  . H/O aortic valve replacement 01/25/2013  . H/O coronary artery bypass surgery 01/25/2013  . History of open heart surgery 01/25/2013  . Angina, class II ([place] 01/22/2013  . Heart failure ([place] 01/22/2013  . 3-vessel CAD 01/17/2013  . Aortic heart valve narrowing 01/17/2013    This entire session was performed under direct supervision and direction of a licensed therapist/therapist assistant . I have personally read, edited and approve of the note as written.   TLutricia Horsfall SPT JPhillips GroutPT, DPT, GCS  Huprich,Jason 03/20/2019, 11:36 AM  [place] [facility]ERVICES [address] [address]Phone: [phone]  Fax:  [phone] Name: Bradley BARANEKMRN: [id number] of Birth: [birth year]

## 2019-03-21 ENCOUNTER — Ambulatory Visit: Payer: PPO

## 2019-03-21 ENCOUNTER — Other Ambulatory Visit: Payer: Self-pay

## 2019-03-21 DIAGNOSIS — M6281 Muscle weakness (generalized): Secondary | ICD-10-CM

## 2019-03-21 DIAGNOSIS — R2681 Unsteadiness on feet: Secondary | ICD-10-CM | POA: Diagnosis not present

## 2019-03-21 DIAGNOSIS — Z9181 History of falling: Secondary | ICD-10-CM

## 2019-03-21 NOTE — Therapy (Signed)
[place] MAIN [facility] SERVICES [address] [place], [place], [address] Phone: [phone]   Fax:  [phone]  Physical Therapy Treatment  Patient Details  Name: Bradley Nelson MRN: [id number] Date of Birth: 31-Aug-1931 Referring Provider (PT): Dr Velna Ochs   Encounter Date: 03/21/2019  PT End of Session - 03/21/19 1145    Visit Number  11    Number of Visits  25    Date for PT Re-Evaluation  05/07/19    Authorization Type  eval 02/12/19    Authorization Time Period  eval 7/7    PT Start Time  1145    PT Stop Time  1230    PT Time Calculation (min)  45 min    Equipment Utilized During Treatment  Gait belt    Activity Tolerance  Patient tolerated treatment well;Other (comment)   BP   Behavior During Therapy  WFL for tasks assessed/performed       Past Medical History:  Diagnosis Date  . Acute blood loss anemia 01/25/2013  . Angina, class II ([place]) 01/22/2013  . Aortic heart valve narrowing 01/17/2013  . Bloody pleural effusion 01/25/2013  . Essential (primary) hypertension 06/02/2014  . H/O aortic valve replacement 01/25/2013  . H/O coronary artery bypass surgery 01/25/2013   Overview:  cabg x3 with LIMA to LAD, SVG to PDA, SVG to left circumflex with septal muomectomy and aortic valve replacement with Ce Magna bioprosthetic valve on 01-24-13 by [name] at [facility].   . Heart failure ([place]) 01/22/2013  . History of open heart surgery 01/25/2013  . Hypercholesterolemia 10/15/2014  . Malignant neoplastic disease ([place]) 04/28/2015   Overview:  prostate cancer, s/p prostatectomy    . Prostate cancer ([place])   . Skin cancer     Past Surgical History:  Procedure Laterality Date  . AORTIC VALVE REPLACEMENT    . CARDIAC SURGERY    . CORONARY ARTERY BYPASS GRAFT    . HERNIA REPAIR    . PROSTATECTOMY  2001   Dr. [name]    There were no vitals filed for this visit.  Subjective Assessment - 03/21/19 1151    Subjective  Pt reports feeling good  today. He reports no pain or soreness today, and has no questions or concerns at this time.    Pertinent History  Pt reports that he has been struggling with his balance since his wife passed approximately 1 year ago. He states that he has had "1-2 falls" in the last 12 months. Otherwise he denies any other known triggers or recent changes in his health. He has a significant cardiac history of CAD and artificial aortic valve with normal EF on last echo. He is followed by cardiology.    Patient Stated Goals  "Improve my balance"    Currently in Pain?  No/denies         [facility] PT Assessment - 03/21/19 1152      6 Minute Walk- Baseline   6 Minute Walk- Baseline  yes    BP (mmHg)  94/56    HR (bpm)  63    02 Sat (%RA)  98 %    Modified Borg Scale for Dyspnea  0- Nothing at all    Perceived Rate of Exertion (Borg)  6-      6 Minute walk- Post Test   6 Minute Walk Post Test  yes    BP (mmHg)  113/53    HR (bpm)  66    02  Sat (%RA)  100 %    Modified Borg Scale for Dyspnea  4- somewhat severe    Perceived Rate of Exertion (Borg)  10-      6 minute walk test results    Aerobic Endurance Distance Walked  800         TREATMENT  Ther-ex  6 MWT - 800'   Step ups onto 6" box 2x10 on each side; intermittent use of hands on 2nd set; difficulty with foot coordination on first few reps when switching sides, with intermittent mistakes on which leg to lead with.  Moderate cueing provided throughout   Lateral cross over step ups onto 6" box x10 on each side.  SUE support throughout   STSs without UE support 2x12; CGA throughout    Neuromuscular Re-ed  Semi-tandem stance on airex without UE support alt LE  3x30s each     Pt educated throughout session about proper posture and technique with exercises. Improved exercise technique, movement at target joints, use of target muscles after min to mod verbal, visual, tactile cues.   Pt demonstrates excellent motivation throughout today's  session.  He was able to improve his 6MWT from 621f at baseline to 8024ftoday.  He demonstrates difficulty with coordinating his foot pattern on step ups, with multiple bouts of hesitation and intermittently performing the wrong sequence.  Moderate cueing was provided throughout to remind him which foot to lead with.  CGA with intermittent minA provided throughout all exercises today.  Pt will benefit from PT services to address deficits in strength, balance, and mobility in order to return to full function at home.        PT Short Term Goals - 03/19/19 1720      PT SHORT TERM GOAL #1   Title  Pt will be independent with HEP in order to improve strength and balance in order to decrease fall risk and improve function at home.    Time  6    Period  Weeks    Status  Achieved    Target Date  03/26/19        PT Long Term Goals - 03/21/19 1410      PT LONG TERM GOAL #1   Title  Pt will improve BERG by at least 3 points in order to demonstrate clinically significant improvement in balance.    Baseline  02/12/19: 49/56; 03/19/19: 52/56    Time  12    Period  Weeks    Status  Achieved    Target Date  05/07/19      PT LONG TERM GOAL #2   Title  Pt will improve DGI by at least 3 points in order to demonstrate clinically significant improvement in balance and decreased risk for falls.    Baseline  02/12/19: 20/24; 03/19/19: 20/24    Time  12    Period  Weeks    Status  Partially Met    Target Date  05/07/19      PT LONG TERM GOAL #3   Title  Pt will decrease 5TSTS by at least 3 seconds in order to demonstrate clinically significant improvement in LE strength.    Baseline  02/12/19: 19.0s; 03/19/19: 17.15sec    Time  12    Period  Weeks    Status  Partially Met    Target Date  05/07/19      PT LONG TERM GOAL #4   Title  Pt will improve ABC by at least 13% in  order to demonstrate clinically significant improvement in balance confidence.    Baseline  02/12/19: 67.5%; 03/19/19: 72.5%    Time   12    Period  Weeks    Status  Partially Met    Target Date  05/07/19      PT LONG TERM GOAL #5   Title  Pt will decrease TUG to below 14 seconds/decrease in order to demonstrate decreased fall risk.    Baseline  02/12/19: 20.4s; 03/19/19: 16.25s    Time  12    Period  Weeks    Status  Partially Met    Target Date  05/07/19      PT LONG TERM GOAL #6   Title  Pt will increased 6MWT by at least 66m(1662f in order to demonstrate clinically significant improvement in cardiopulmonary endurance and community ambulation.    Baseline  02/21/19: 62579f8/13/20: 800f55f Time  12    Period  Weeks    Status  Achieved    Target Date  05/07/19            Plan - 03/21/19 1421    Clinical Impression Statement  Pt demonstrates excellent motivation throughout today's session.  He was able to improve his 6MWT from 625ft87fbaseline to 800ft 31fy.  He demonstrates difficulty with coordinating his foot pattern on step ups, with multiple bouts of hesitation and intermittently performing the wrong sequence.  Moderate cueing was provided throughout to remind him which foot to lead with.  CGA with intermittent minA provided throughout all exercises today.  Pt will benefit from PT services to address deficits in strength, balance, and mobility in order to return to full function at home.    Personal Factors and Comorbidities  Age;Fitness;Comorbidity 1;Comorbidity 2;Time since onset of injury/illness/exacerbation    Comorbidities  Depression, CAD    Examination-Activity Limitations  Caring for Others;Squat;Stairs;Stand    Examination-Participation Restrictions  Cleaning;Community Activity;Yard Work;Shop    Stability/Clinical Decision Making  Evolving/Moderate complexity    Rehab Potential  Good    PT Frequency  2x / week    PT Duration  12 weeks    PT Treatment/Interventions  ADLs/Self Care Home Management;Aquatic Therapy;Biofeedback;Canalith Repostioning;Cryotherapy;Electrical Stimulation;Iontophoresis  4mg/ml8mxamethasone;Moist Heat;Traction;Ultrasound;DME Instruction;Gait training;Stair training;Functional mobility training;Therapeutic activities;Therapeutic exercise;Balance training;Neuromuscular re-education;Cognitive remediation;Patient/family education;Manual techniques;Vestibular    PT Next Visit Plan  increase step length, arm swing and cadence; progress strengthening and balance    PT Home Exercise Plan  Medbridge Access Code: [email or web address] balance and sit to stand repeats)    Consulted and Agree with Plan of Care  Patient       Patient will benefit from skilled therapeutic intervention in order to improve the following deficits and impairments:  Abnormal gait, Cardiopulmonary status limiting activity, Decreased activity tolerance, Decreased balance, Decreased endurance, Decreased strength, Difficulty walking  Visit Diagnosis: 1. Unsteadiness on feet   2. Muscle weakness (generalized)   3. History of falling        Problem List Patient Active Problem List   Diagnosis Date Noted  . Malignant neoplastic disease (HCC) 09[place]/2016  . Hypercholesterolemia 10/15/2014  . Essential (primary) hypertension 06/02/2014  . Acute blood loss anemia 01/25/2013  . Bloody pleural effusion 01/25/2013  . Pain following surgery or procedure 01/25/2013  . H/O aortic valve replacement 01/25/2013  . H/O coronary artery bypass surgery 01/25/2013  . History of open heart surgery 01/25/2013  . Angina, class II (HCC) 06[place]/2014  . Heart failure (HCC) 06[place]/2014  . 3-vessel  CAD 01/17/2013  . Aortic heart valve narrowing 01/17/2013    This entire session was performed under direct supervision and direction of a licensed therapist/therapist assistant . I have personally read, edited and approve of the note as written.    Lutricia Horsfall, SPT Phillips Grout PT, DPT, GCS  Huprich,Jason 03/21/2019, 4:35 PM  [place] MAIN [facility] SERVICES [address] [place], [place], [address] Phone: [phone]   Fax:  [phone]  Name: LEOVARDO THOMAN MRN: [id number] Date of Birth: 10/07/1931

## 2019-03-26 ENCOUNTER — Other Ambulatory Visit: Payer: Self-pay

## 2019-03-26 ENCOUNTER — Ambulatory Visit: Payer: PPO

## 2019-03-26 DIAGNOSIS — R2681 Unsteadiness on feet: Secondary | ICD-10-CM | POA: Diagnosis not present

## 2019-03-26 DIAGNOSIS — M6281 Muscle weakness (generalized): Secondary | ICD-10-CM

## 2019-03-26 DIAGNOSIS — Z9181 History of falling: Secondary | ICD-10-CM

## 2019-03-26 NOTE — Therapy (Signed)
[place] MAIN [facility] SERVICES [address] [place], [place], [address] Phone: [phone]   Fax:  [phone]  Physical Therapy Treatment  Patient Details  Name: Bradley Nelson MRN: [id number] Date of Birth: 09-21-1931 Referring Provider (PT): Dr Velna Ochs   Encounter Date: 03/26/2019  PT End of Session - 03/26/19 1353    Visit Number  12    Number of Visits  25    Date for PT Re-Evaluation  05/07/19    Authorization Type  eval 02/12/19    Authorization Time Period  eval 7/7    PT Start Time  1345    PT Stop Time  1430    PT Time Calculation (min)  45 min    Equipment Utilized During Treatment  Gait belt    Activity Tolerance  Patient tolerated treatment well;Other (comment)   BP   Behavior During Therapy  WFL for tasks assessed/performed       Past Medical History:  Diagnosis Date  . Acute blood loss anemia 01/25/2013  . Angina, class II ([place]) 01/22/2013  . Aortic heart valve narrowing 01/17/2013  . Bloody pleural effusion 01/25/2013  . Essential (primary) hypertension 06/02/2014  . H/O aortic valve replacement 01/25/2013  . H/O coronary artery bypass surgery 01/25/2013   Overview:  cabg x3 with LIMA to LAD, SVG to PDA, SVG to left circumflex with septal muomectomy and aortic valve replacement with Ce Magna bioprosthetic valve on 01-24-13 by [name] at [facility].   . Heart failure ([place]) 01/22/2013  . History of open heart surgery 01/25/2013  . Hypercholesterolemia 10/15/2014  . Malignant neoplastic disease ([place]) 04/28/2015   Overview:  prostate cancer, s/p prostatectomy    . Prostate cancer ([place])   . Skin cancer     Past Surgical History:  Procedure Laterality Date  . AORTIC VALVE REPLACEMENT    . CARDIAC SURGERY    . CORONARY ARTERY BYPASS GRAFT    . HERNIA REPAIR    . PROSTATECTOMY  2001   Dr. [name]    There were no vitals filed for this visit.  Subjective Assessment - 03/26/19 1351    Subjective  Pt reports doing well  today. He states that he was excessively fatigued over the weekend after walking around a store for a while to find what he was looking for. No c/o of soreness or pain. No quesstions or concerns at this time.    Pertinent History  Pt reports that he has been struggling with his balance since his wife passed approximately 1 year ago. He states that he has had "1-2 falls" in the last 12 months. Otherwise he denies any other known triggers or recent changes in his health. He has a significant cardiac history of CAD and artificial aortic valve with normal EF on last echo. He is followed by cardiology.    Patient Stated Goals  "Improve my balance"    Currently in Pain?  No/denies       TREATMENT    Therapeutic Exercise  NuStep L3 x 5 minutes warmup during history  6" Step ups forward without UE support with airex on ground x10 each LE   6" lateral toe taps x10 on each LE with airex on ground; SUE support throughout, greater difficulty when RLE stationary on airex     STS without UE support x12; CGA throughout  Standing hip abduction 4# AW x12 each LE   Standing hip extension 4# AW x12 each LE Standing marches  4# AW x10 each LE   Standing HS curl 4# AW x10 each LE with verbal and tactile cueing to prevent hip flexion compensation    Neuromuscular Re-education  STS without UE support with airex under feet x5; mod challenge to pt difficulty with anterior weight shift and would take multiple attempts for the first few repetitions  Semi-tandem stance on airex without UE support alt LE 3x30s each  Slow marches on airex 2x30s  Standing airex ball pass from PT to R shoulder and pass back on contralateral side for pelvic rotation x 5; repeated on L side  Forward gait 75'x4 with cues to increase step length and arm swing.  Initially took 42 steps to get 75', able to get 41-42 consistently with cueing Biodex gait working on BIG steps with biofeedback x5 mins; pt maximally challenged    Pt educated  throughout session about proper posture and technique with exercises. Improved exercise technique, movement at target joints, use of target muscles after min to mod verbal, visual, tactile cues.       Pt demonstrates excellent motivation during today's session. Pt demonstrates difficulty with lateral toe taps with uneven surface under stationary LE. He is challenged more when right LE is in prolonged single leg stance requiring CGA to minA for steadying.  He has difficulty with large amplitude movements needing mod-max verbal cueing for big steps during gait in hallway. We attempted gait on the biodex today to get additional feedback and used a ball in front of patient for external frame of reference to reinforce larger amplitude stepping; pt continued to demonstrate difficulty, however, he did express mod fatigue at this point in the session. Pt demonstrates difficulty with pelvic rotation during exercise requiring max verbal cueing, however still has difficulty with the movement. CGA provided throughout session for safety. Pt will benefit from PT services to address deficits in strength, balance, and mobility in order to return to full function at home.       PT Education - 03/26/19 1353    Education Details  exercise technique throughout session    Person(s) Educated  Patient    Methods  Explanation;Demonstration;Tactile cues;Verbal cues    Comprehension  Verbal cues required;Returned demonstration;Verbalized understanding       PT Short Term Goals - 03/19/19 1720      PT SHORT TERM GOAL #1   Title  Pt will be independent with HEP in order to improve strength and balance in order to decrease fall risk and improve function at home.    Time  6    Period  Weeks    Status  Achieved    Target Date  03/26/19        PT Long Term Goals - 03/21/19 1410      PT LONG TERM GOAL #1   Title  Pt will improve BERG by at least 3 points in order to demonstrate clinically significant improvement in  balance.    Baseline  02/12/19: 49/56; 03/19/19: 52/56    Time  12    Period  Weeks    Status  Achieved    Target Date  05/07/19      PT LONG TERM GOAL #2   Title  Pt will improve DGI by at least 3 points in order to demonstrate clinically significant improvement in balance and decreased risk for falls.    Baseline  02/12/19: 20/24; 03/19/19: 20/24    Time  12    Period  Weeks    Status  Partially Met    Target Date  05/07/19      PT LONG TERM GOAL #3   Title  Pt will decrease 5TSTS by at least 3 seconds in order to demonstrate clinically significant improvement in LE strength.    Baseline  02/12/19: 19.0s; 03/19/19: 17.15sec    Time  12    Period  Weeks    Status  Partially Met    Target Date  05/07/19      PT LONG TERM GOAL #4   Title  Pt will improve ABC by at least 13% in order to demonstrate clinically significant improvement in balance confidence.    Baseline  02/12/19: 67.5%; 03/19/19: 72.5%    Time  12    Period  Weeks    Status  Partially Met    Target Date  05/07/19      PT LONG TERM GOAL #5   Title  Pt will decrease TUG to below 14 seconds/decrease in order to demonstrate decreased fall risk.    Baseline  02/12/19: 20.4s; 03/19/19: 16.25s    Time  12    Period  Weeks    Status  Partially Met    Target Date  05/07/19      PT LONG TERM GOAL #6   Title  Pt will increased 6MWT by at least 50m(1651f in order to demonstrate clinically significant improvement in cardiopulmonary endurance and community ambulation.    Baseline  02/21/19: 6258f8/13/20: 800f14f Time  12    Period  Weeks    Status  Achieved    Target Date  05/07/19            Plan - 03/26/19 1353    Clinical Impression Statement  Pt demonstrates excellent motivation during today's session. Pt demonstrates difficulty with lateral toe taps with uneven surface under stationary LE. He is challenged more when right LE is in prolonged single leg stance requiring CGA to minA for steadying.  He has difficulty with  large amplitude movements needing mod-max verbal cueing for big steps during gait in hallway. We attempted gait on the biodex today to get additional feedback and used a ball in front of patient for external frame of reference to reinforce larger amplitude stepping; pt continued to demonstrate difficulty, however, he did express mod fatigue at this point in the session. Pt demonstrates difficulty with pelvic rotation during exercise requiring max verbal cueing, however still has difficulty with the movement. CGA provided throughout session for safety. Pt will benefit from PT services to address deficits in strength, balance, and mobility in order to return to full function at home.    Personal Factors and Comorbidities  Age;Fitness;Comorbidity 1;Comorbidity 2;Time since onset of injury/illness/exacerbation    Comorbidities  Depression, CAD    Examination-Activity Limitations  Caring for Others;Squat;Stairs;Stand    Examination-Participation Restrictions  Cleaning;Community Activity;Yard Work;Shop    Stability/Clinical Decision Making  Evolving/Moderate complexity    Rehab Potential  Good    PT Frequency  2x / week    PT Duration  12 weeks    PT Treatment/Interventions  ADLs/Self Care Home Management;Aquatic Therapy;Biofeedback;Canalith Repostioning;Cryotherapy;Electrical Stimulation;Iontophoresis 4mg/68mDexamethasone;Moist Heat;Traction;Ultrasound;DME Instruction;Gait training;Stair training;Functional mobility training;Therapeutic activities;Therapeutic exercise;Balance training;Neuromuscular re-education;Cognitive remediation;Patient/family education;Manual techniques;Vestibular    PT Next Visit Plan  increase step length, arm swing and cadence; progress strengthening and balance    PT Home Exercise Plan  Medbridge Access Code: [email or web address] (Semitandem balance and sit to stand repeats)    Consulted and Agree with Plan  of Care  Patient       Patient will benefit from skilled therapeutic intervention in  order to improve the following deficits and impairments:  Abnormal gait, Cardiopulmonary status limiting activity, Decreased activity tolerance, Decreased balance, Decreased endurance, Decreased strength, Difficulty walking  Visit Diagnosis: 1. Unsteadiness on feet   2. Muscle weakness (generalized)   3. History of falling        Problem List Patient Active Problem List   Diagnosis Date Noted  . Malignant neoplastic disease ([place]) 04/28/2015  . Hypercholesterolemia 10/15/2014  . Essential (primary) hypertension 06/02/2014  . Acute blood loss anemia 01/25/2013  . Bloody pleural effusion 01/25/2013  . Pain following surgery or procedure 01/25/2013  . H/O aortic valve replacement 01/25/2013  . H/O coronary artery bypass surgery 01/25/2013  . History of open heart surgery 01/25/2013  . Angina, class II ([place]) 01/22/2013  . Heart failure ([place]) 01/22/2013  . 3-vessel CAD 01/17/2013  . Aortic heart valve narrowing 01/17/2013    This entire session was performed under direct supervision and direction of a licensed therapist/therapist assistant . I have personally read, edited and approve of the note as written.   Elmyra Ricks Khanh Cordner SPT Phillips Grout PT, DPT, GCS  Huprich,Jason 03/27/2019, 8:48 AM  [place] MAIN [facility] SERVICES [address] [place], [place], [address] Phone: [phone]   Fax:  [phone]  Name: Bradley Nelson MRN: [id number] Date of Birth: [birth year]

## 2019-03-28 ENCOUNTER — Other Ambulatory Visit: Payer: Self-pay

## 2019-03-28 ENCOUNTER — Ambulatory Visit: Payer: PPO

## 2019-03-28 DIAGNOSIS — M6281 Muscle weakness (generalized): Secondary | ICD-10-CM

## 2019-03-28 DIAGNOSIS — R2681 Unsteadiness on feet: Secondary | ICD-10-CM | POA: Diagnosis not present

## 2019-03-28 DIAGNOSIS — Z9181 History of falling: Secondary | ICD-10-CM

## 2019-03-28 NOTE — Therapy (Signed)
[place] MAIN [facility] SERVICES [address] [place], [place], [address] Phone: [phone]   Fax:  [phone]  Physical Therapy Treatment  Patient Details  Name: Bradley Nelson MRN: [id number] Date of Birth: [birth year] Referring Provider (PT): Dr Velna Ochs   Encounter Date: 03/28/2019  PT End of Session - 03/28/19 1349    Visit Number  13    Number of Visits  25    Date for PT Re-Evaluation  05/07/19    Authorization Type  eval 02/12/19    Authorization Time Period  eval 7/7    PT Start Time  1345    PT Stop Time  1429    PT Time Calculation (min)  44 min    Equipment Utilized During Treatment  Gait belt    Activity Tolerance  Patient tolerated treatment well;Other (comment)   BP   Behavior During Therapy  WFL for tasks assessed/performed       Past Medical History:  Diagnosis Date  . Acute blood loss anemia 01/25/2013  . Angina, class II ([place]) 01/22/2013  . Aortic heart valve narrowing 01/17/2013  . Bloody pleural effusion 01/25/2013  . Essential (primary) hypertension 06/02/2014  . H/O aortic valve replacement 01/25/2013  . H/O coronary artery bypass surgery 01/25/2013   Overview:  cabg x3 with LIMA to LAD, SVG to PDA, SVG to left circumflex with septal muomectomy and aortic valve replacement with Ce Magna bioprosthetic valve on 01-24-13 by [name] at [facility].   . Heart failure ([place]) 01/22/2013  . History of open heart surgery 01/25/2013  . Hypercholesterolemia 10/15/2014  . Malignant neoplastic disease ([place]) 04/28/2015   Overview:  prostate cancer, s/p prostatectomy    . Prostate cancer ([place])   . Skin cancer     Past Surgical History:  Procedure Laterality Date  . AORTIC VALVE REPLACEMENT    . CARDIAC SURGERY    . CORONARY ARTERY BYPASS GRAFT    . HERNIA REPAIR    . PROSTATECTOMY  2001   Dr. [name]    There were no vitals filed for this visit.  Subjective Assessment - 03/28/19 1348    Subjective  Patient reports no  falls or LOB since last sessions. Is most wobbly when he first gets up.    Pertinent History  Pt reports that he has been struggling with his balance since his wife passed approximately 1 year ago. He states that he has had "1-2 falls" in the last 12 months. Otherwise he denies any other known triggers or recent changes in his health. He has a significant cardiac history of CAD and artificial aortic valve with normal EF on last echo. He is followed by cardiology.    Patient Stated Goals  "Improve my balance"    Currently in Pain?  No/denies              TREATMENT     Therapeutic Exercise  NuStep L3 x 3 minutes, keeping RPM> 60 for cardiovascular and and muscular activation   Sit to stands with press of 5lb weighted bar 10x  4lb ankle weight: -seated LAQ 10x 3 second holds  -seated upright posture with arms crossed marching for postural and LE strengthening x 12 each LE   Seated LAQ with adduction ball squeeze 10x    Neuromuscular Re-education  Green light red light: initiatoin/termination of ambulation 2x 86 ft.  Ambulate with horizontal head turns 2x 86 ft airex pad 6" step toe tops 20x no UE support  airex pad: 6" step lateral toe taps SU support, excessive posterior trunk lean 15x each LE  airex pad: 5 lb bar chest press 10x  airex pad: 5lb bar rotation 12x, challenging to ankle stability     Reciprocating toe taps on 2" step seated for coordination and spatial awareness 30 seconds x2 trials  Pt educated throughout session about proper posture and technique with exercises. Improved exercise technique, movement at target joints, use of target muscles after min to mod verbal, visual, tactile cues.                    PT Education - 03/28/19 1348    Education Details  exercise technique, body mechanics    Person(s) Educated  Patient    Methods  Explanation;Demonstration;Tactile cues;Verbal cues    Comprehension  Verbalized understanding;Returned  demonstration;Verbal cues required;Tactile cues required       PT Short Term Goals - 03/19/19 1720      PT SHORT TERM GOAL #1   Title  Pt will be independent with HEP in order to improve strength and balance in order to decrease fall risk and improve function at home.    Time  6    Period  Weeks    Status  Achieved    Target Date  03/26/19        PT Long Term Goals - 03/21/19 1410      PT LONG TERM GOAL #1   Title  Pt will improve BERG by at least 3 points in order to demonstrate clinically significant improvement in balance.    Baseline  02/12/19: 49/56; 03/19/19: 52/56    Time  12    Period  Weeks    Status  Achieved    Target Date  05/07/19      PT LONG TERM GOAL #2   Title  Pt will improve DGI by at least 3 points in order to demonstrate clinically significant improvement in balance and decreased risk for falls.    Baseline  02/12/19: 20/24; 03/19/19: 20/24    Time  12    Period  Weeks    Status  Partially Met    Target Date  05/07/19      PT LONG TERM GOAL #3   Title  Pt will decrease 5TSTS by at least 3 seconds in order to demonstrate clinically significant improvement in LE strength.    Baseline  02/12/19: 19.0s; 03/19/19: 17.15sec    Time  12    Period  Weeks    Status  Partially Met    Target Date  05/07/19      PT LONG TERM GOAL #4   Title  Pt will improve ABC by at least 13% in order to demonstrate clinically significant improvement in balance confidence.    Baseline  02/12/19: 67.5%; 03/19/19: 72.5%    Time  12    Period  Weeks    Status  Partially Met    Target Date  05/07/19      PT LONG TERM GOAL #5   Title  Pt will decrease TUG to below 14 seconds/decrease in order to demonstrate decreased fall risk.    Baseline  02/12/19: 20.4s; 03/19/19: 16.25s    Time  12    Period  Weeks    Status  Partially Met    Target Date  05/07/19      PT LONG TERM GOAL #6   Title  Pt will increased 6MWT by at least 91m(1635f in  order to demonstrate clinically significant  improvement in cardiopulmonary endurance and community ambulation.    Baseline  02/21/19: 653f; 03/21/19: 8069f   Time  12    Period  Weeks    Status  Achieved    Target Date  05/07/19            Plan - 03/28/19 1425    Clinical Impression Statement  Patient presents with good motivation despite his brother being in the hospital today. Is challenged with clearance of LLE, frequently scuffing of foot when fatigued. Posterior trunk lean with unstable surfaces noted that improves with cueing for abdominal activation and positional awareness. Pt will benefit from PT services to address deficits in strength, balance, and mobility in order to return to full function at home.    Personal Factors and Comorbidities  Age;Fitness;Comorbidity 1;Comorbidity 2;Time since onset of injury/illness/exacerbation    Comorbidities  Depression, CAD    Examination-Activity Limitations  Caring for Others;Squat;Stairs;Stand    Examination-Participation Restrictions  Cleaning;Community Activity;Yard Work;Shop    Stability/Clinical Decision Making  Evolving/Moderate complexity    Rehab Potential  Good    PT Frequency  2x / week    PT Duration  12 weeks    PT Treatment/Interventions  ADLs/Self Care Home Management;Aquatic Therapy;Biofeedback;Canalith Repostioning;Cryotherapy;Electrical Stimulation;Iontophoresis 51m47ml Dexamethasone;Moist Heat;Traction;Ultrasound;DME Instruction;Gait training;Stair training;Functional mobility training;Therapeutic activities;Therapeutic exercise;Balance training;Neuromuscular re-education;Cognitive remediation;Patient/family education;Manual techniques;Vestibular    PT Next Visit Plan  increase step length, arm swing and cadence; progress strengthening and balance    PT Home Exercise Plan  Medbridge Access Code: [email or web address] (Semitandem balance and sit to stand repeats)    Consulted and Agree with Plan of Care  Patient       Patient will benefit from skilled therapeutic intervention  in order to improve the following deficits and impairments:  Abnormal gait, Cardiopulmonary status limiting activity, Decreased activity tolerance, Decreased balance, Decreased endurance, Decreased strength, Difficulty walking  Visit Diagnosis: Unsteadiness on feet  Muscle weakness (generalized)  History of falling     Problem List Patient Active Problem List   Diagnosis Date Noted  . Malignant neoplastic disease ([place]9/20/2016  . Hypercholesterolemia 10/15/2014  . Essential (primary) hypertension 06/02/2014  . Acute blood loss anemia 01/25/2013  . Bloody pleural effusion 01/25/2013  . Pain following surgery or procedure 01/25/2013  . H/O aortic valve replacement 01/25/2013  . H/O coronary artery bypass surgery 01/25/2013  . History of open heart surgery 01/25/2013  . Angina, class II ([place]6/17/2014  . Heart failure ([place]6/17/2014  . 3-vessel CAD 01/17/2013  . Aortic heart valve narrowing 01/17/2013   MarJanna ArchT, DPT   03/28/2019, 2:28 PM  [facility] [address] [place]one: [phone]:  [phone]: Bradley Nelson: [phone] of Birth: 5/3October 15, 1933

## 2019-04-02 ENCOUNTER — Other Ambulatory Visit: Payer: Self-pay

## 2019-04-02 ENCOUNTER — Ambulatory Visit: Payer: PPO

## 2019-04-02 DIAGNOSIS — M6281 Muscle weakness (generalized): Secondary | ICD-10-CM

## 2019-04-02 DIAGNOSIS — R2681 Unsteadiness on feet: Secondary | ICD-10-CM

## 2019-04-02 DIAGNOSIS — R278 Other lack of coordination: Secondary | ICD-10-CM

## 2019-04-02 DIAGNOSIS — Z9181 History of falling: Secondary | ICD-10-CM

## 2019-04-02 NOTE — Therapy (Signed)
[place] MAIN [facility] SERVICES [address] [place], [place], [address] Phone: [phone]   Fax:  [phone]  Physical Therapy Treatment  Patient Details  Name: Bradley Nelson MRN: [id number] Date of Birth: 1932-07-17 Referring Provider (PT): Dr Velna Ochs   Encounter Date: 04/02/2019  PT End of Session - 04/02/19 1611    Visit Number  14    Number of Visits  25    Date for PT Re-Evaluation  05/07/19    Authorization Type  eval 02/12/19, goals updated 03/21/19    Authorization Time Period  --    PT Start Time  1602    PT Stop Time  1645    PT Time Calculation (min)  43 min    Equipment Utilized During Treatment  Gait belt    Activity Tolerance  Patient tolerated treatment well;Other (comment)   BP   Behavior During Therapy  WFL for tasks assessed/performed       Past Medical History:  Diagnosis Date  . Acute blood loss anemia 01/25/2013  . Angina, class II ([place]) 01/22/2013  . Aortic heart valve narrowing 01/17/2013  . Bloody pleural effusion 01/25/2013  . Essential (primary) hypertension 06/02/2014  . H/O aortic valve replacement 01/25/2013  . H/O coronary artery bypass surgery 01/25/2013   Overview:  cabg x3 with LIMA to LAD, SVG to PDA, SVG to left circumflex with septal muomectomy and aortic valve replacement with Ce Magna bioprosthetic valve on 01-24-13 by [name] at [facility].   . Heart failure ([place]) 01/22/2013  . History of open heart surgery 01/25/2013  . Hypercholesterolemia 10/15/2014  . Malignant neoplastic disease ([place]) 04/28/2015   Overview:  prostate cancer, s/p prostatectomy    . Prostate cancer ([place])   . Skin cancer     Past Surgical History:  Procedure Laterality Date  . AORTIC VALVE REPLACEMENT    . CARDIAC SURGERY    . CORONARY ARTERY BYPASS GRAFT    . HERNIA REPAIR    . PROSTATECTOMY  2001   Dr. [name]    Vitals:   04/02/19 1612 04/02/19 1613 04/02/19 1614  SpO2: 98% 98% 99%    Subjective Assessment -  04/02/19 1601    Subjective  Pt reports having a 'bad day' yesterday and feeling off, however, today has been better. He did not experience any falls but felt like the room was spinning and felt unsteady on multiple occassions when transitioning from sit to stand. He reports feeling like the room is spinning for about 10 minutes or less when he experiences these episodes which can occur when looking to laterally in either direction or bending over to pick something up. He denies any dizziness with rolling in bed or looking up while in the shower.    Pertinent History  Pt reports that he has been struggling with his balance since his wife passed approximately 1 year ago. He states that he has had "1-2 falls" in the last 12 months. Otherwise he denies any other known triggers or recent changes in his health. He has a significant cardiac history of CAD and artificial aortic valve with normal EF on last echo. He is followed by cardiology.    Patient Stated Goals  "Improve my balance"    Currently in Pain?  No/denies          TREATMENT   Therapeutic Exercise  STS without UE support x10; CGA throughout  STS without OH press (2000gr) ball x10; CGA throughout  Standing  hip abduction 4# AW x12 each LE   Standing hip extension 4# AW x12 each LE Standing marches 4# AW x12 each LE ;  Standing HS curl 4# AW x10 each LE with verb al and tactile cueing to prevent hip flexion compensation     Neuromuscular Re-education  STS without UE support with airex under feet x5; mod challenge to pt difficulty with anterior weight shift and would take multiple attempts for the first few repetitions  Semi-tandem stance on airex without UE support alt LE x30s each  Standing narrow stance on airex EC 2x30s; minA for steadying  6"  forward toe taps  without UE support with airex on ground x10 each LE   6" lateral toe taps x10 on each LE with airex on ground; SUE support throughout, greater difficulty when RLE stationary  on airex, excessive posterior trunk lean     Standing airex with hedge hog taps for inc SLS 1 at a time with toe touch down in between x5 each LE; minA for steadying      Pt educated throughout session about proper posture and technique with exercises. Improved exercise technique, movement at target joints, use of target muscles after min to mod verbal, visual, tactile cues.       Pt demonstrated excellent motivation during today's session.He demonstrated moderate difficulty with balance on a compromised surface particularly with exercises that increase single leg stance time. He demonstrates difficulty with lateral toe taps with uneven surface under stationary LE. He is challenged more when right LE is in prolonged single leg stance requiring CGA to minA for steadying.  He has difficulty with large amplitude movements needing mod-max verbal cueing for big steps during gait in hallway.  Pt demonstrates difficulty with pelvic rotation during exercise requiring max verbal cueing, however still has difficulty with the movement. CGA provided throughout session for safety. Pt will benefit from PT services to address deficits in strength, balance, and mobility in order to return to full function at home.       PT Education - 04/02/19 1611    Education Details  Exercise technique    Person(s) Educated  Patient    Methods  Explanation;Demonstration;Tactile cues;Verbal cues    Comprehension  Verbal cues required;Tactile cues required;Returned demonstration;Verbalized understanding       PT Short Term Goals - 03/19/19 1720      PT SHORT TERM GOAL #1   Title  Pt will be independent with HEP in order to improve strength and balance in order to decrease fall risk and improve function at home.    Time  6    Period  Weeks    Status  Achieved    Target Date  03/26/19        PT Long Term Goals - 03/21/19 1410      PT LONG TERM GOAL #1   Title  Pt will improve BERG by at least 3 points in order to  demonstrate clinically significant improvement in balance.    Baseline  02/12/19: 49/56; 03/19/19: 52/56    Time  12    Period  Weeks    Status  Achieved    Target Date  05/07/19      PT LONG TERM GOAL #2   Title  Pt will improve DGI by at least 3 points in order to demonstrate clinically significant improvement in balance and decreased risk for falls.    Baseline  02/12/19: 20/24; 03/19/19: 20/24    Time  12  Period  Weeks    Status  Partially Met    Target Date  05/07/19      PT LONG TERM GOAL #3   Title  Pt will decrease 5TSTS by at least 3 seconds in order to demonstrate clinically significant improvement in LE strength.    Baseline  02/12/19: 19.0s; 03/19/19: 17.15sec    Time  12    Period  Weeks    Status  Partially Met    Target Date  05/07/19      PT LONG TERM GOAL #4   Title  Pt will improve ABC by at least 13% in order to demonstrate clinically significant improvement in balance confidence.    Baseline  02/12/19: 67.5%; 03/19/19: 72.5%    Time  12    Period  Weeks    Status  Partially Met    Target Date  05/07/19      PT LONG TERM GOAL #5   Title  Pt will decrease TUG to below 14 seconds/decrease in order to demonstrate decreased fall risk.    Baseline  02/12/19: 20.4s; 03/19/19: 16.25s    Time  12    Period  Weeks    Status  Partially Met    Target Date  05/07/19      PT LONG TERM GOAL #6   Title  Pt will increased 6MWT by at least 62m(1658f in order to demonstrate clinically significant improvement in cardiopulmonary endurance and community ambulation.    Baseline  02/21/19: 62527f8/13/20: 800f75f Time  12    Period  Weeks    Status  Achieved    Target Date  05/07/19            Plan - 04/02/19 1612    Clinical Impression Statement  Pt demonstrated excellent motivation during today's session.He demonstrated moderate difficulty with balance on a compromised surface particularly with exercises that increase single leg stance time. He demonstrates difficulty with  lateral toe taps with uneven surface under stationary LE. He is challenged more when right LE is in prolonged single leg stance requiring CGA to minA for steadying.  He has difficulty with large amplitude movements needing mod-max verbal cueing for big steps during gait in hallway.  Pt demonstrates difficulty with pelvic rotation during exercise requiring max verbal cueing, however still has difficulty with the movement. CGA provided throughout session for safety. Pt will benefit from PT services to address deficits in strength, balance, and mobility in order to return to full function at home.    Personal Factors and Comorbidities  Age;Fitness;Comorbidity 1;Comorbidity 2;Time since onset of injury/illness/exacerbation    Comorbidities  Depression, CAD    Examination-Activity Limitations  Caring for Others;Squat;Stairs;Stand    Examination-Participation Restrictions  Cleaning;Community Activity;Yard Work;Shop    Stability/Clinical Decision Making  Evolving/Moderate complexity    Rehab Potential  Good    PT Frequency  2x / week    PT Duration  12 weeks    PT Treatment/Interventions  ADLs/Self Care Home Management;Aquatic Therapy;Biofeedback;Canalith Repostioning;Cryotherapy;Electrical Stimulation;Iontophoresis 4mg/32mDexamethasone;Moist Heat;Traction;Ultrasound;DME Instruction;Gait training;Stair training;Functional mobility training;Therapeutic activities;Therapeutic exercise;Balance training;Neuromuscular re-education;Cognitive remediation;Patient/family education;Manual techniques;Vestibular    PT Next Visit Plan  increase step length, arm swing and cadence; progress strengthening and balance    PT Home Exercise Plan  Medbridge Access Code: [email or web address] (Semitandem balance and sit to stand repeats)    Consulted and Agree with Plan of Care  Patient       Patient will benefit from skilled therapeutic intervention in order  to improve the following deficits and impairments:  Abnormal gait, Cardiopulmonary  status limiting activity, Decreased activity tolerance, Decreased balance, Decreased endurance, Decreased strength, Difficulty walking  Visit Diagnosis: Unsteadiness on feet  Muscle weakness (generalized)  History of falling  Other lack of coordination     Problem List Patient Active Problem List   Diagnosis Date Noted  . Malignant neoplastic disease ([place]) 04/28/2015  . Hypercholesterolemia 10/15/2014  . Essential (primary) hypertension 06/02/2014  . Acute blood loss anemia 01/25/2013  . Bloody pleural effusion 01/25/2013  . Pain following surgery or procedure 01/25/2013  . H/O aortic valve replacement 01/25/2013  . H/O coronary artery bypass surgery 01/25/2013  . History of open heart surgery 01/25/2013  . Angina, class II ([place]) 01/22/2013  . Heart failure ([place]) 01/22/2013  . 3-vessel CAD 01/17/2013  . Aortic heart valve narrowing 01/17/2013   This entire session was performed under direct supervision and direction of a licensed therapist/therapist assistant . I have personally read, edited and approve of the note as written.   Elmyra Ricks Luba Matzen SPT Phillips Grout PT, DPT, GCS  Huprich,Jason 04/03/2019, 10:25 AM  [place] MAIN [facility] SERVICES [address] [place], [place], [address] Phone: [phone]   Fax:  [phone]  Name: Bradley Nelson MRN: [id number] Date of Birth: 01/16/1932

## 2019-04-09 ENCOUNTER — Other Ambulatory Visit: Payer: Self-pay

## 2019-04-09 ENCOUNTER — Ambulatory Visit: Payer: PPO | Attending: Internal Medicine

## 2019-04-09 DIAGNOSIS — Z9181 History of falling: Secondary | ICD-10-CM | POA: Diagnosis not present

## 2019-04-09 DIAGNOSIS — R2681 Unsteadiness on feet: Secondary | ICD-10-CM | POA: Diagnosis not present

## 2019-04-09 DIAGNOSIS — R278 Other lack of coordination: Secondary | ICD-10-CM | POA: Diagnosis not present

## 2019-04-09 DIAGNOSIS — M6281 Muscle weakness (generalized): Secondary | ICD-10-CM | POA: Diagnosis not present

## 2019-04-09 NOTE — Therapy (Signed)
[place] MAIN [facility] SERVICES [address] [place], [place], [address] Phone: [phone]   Fax:  [phone]  Physical Therapy Treatment  Patient Details  Name: Bradley Nelson MRN: [id number] Date of Birth: May 06, 1932 Referring Provider (PT): Dr Velna Ochs   Encounter Date: 04/09/2019  PT End of Session - 04/09/19 1233    Visit Number  15    Number of Visits  25    Date for PT Re-Evaluation  05/07/19    Authorization Type  eval 02/12/19, goals updated 03/21/19    PT Start Time  1345    PT Stop Time  1430    PT Time Calculation (min)  45 min    Equipment Utilized During Treatment  Gait belt    Activity Tolerance  Patient tolerated treatment well;Other (comment)   BP   Behavior During Therapy  WFL for tasks assessed/performed       Past Medical History:  Diagnosis Date  . Acute blood loss anemia 01/25/2013  . Angina, class II ([place]) 01/22/2013  . Aortic heart valve narrowing 01/17/2013  . Bloody pleural effusion 01/25/2013  . Essential (primary) hypertension 06/02/2014  . H/O aortic valve replacement 01/25/2013  . H/O coronary artery bypass surgery 01/25/2013   Overview:  cabg x3 with LIMA to LAD, SVG to PDA, SVG to left circumflex with septal muomectomy and aortic valve replacement with Ce Magna bioprosthetic valve on 01-24-13 by [name] at [facility].   . Heart failure ([place]) 01/22/2013  . History of open heart surgery 01/25/2013  . Hypercholesterolemia 10/15/2014  . Malignant neoplastic disease ([place]) 04/28/2015   Overview:  prostate cancer, s/p prostatectomy    . Prostate cancer ([place])   . Skin cancer     Past Surgical History:  Procedure Laterality Date  . AORTIC VALVE REPLACEMENT    . CARDIAC SURGERY    . CORONARY ARTERY BYPASS GRAFT    . HERNIA REPAIR    . PROSTATECTOMY  2001   Dr. [name]    There were no vitals filed for this visit.  Subjective Assessment - 04/09/19 1351    Subjective  Pt reports he is feeling okay today.  He  states that he had one episode of dizziness last week when he was walking outside, and states that after standing for 2 min, it passed.  He does not report any falls since last visit.  He denies any pain or soreness today.    Pertinent History  Pt reports that he has been struggling with his balance since his wife passed approximately 1 year ago. He states that he has had "1-2 falls" in the last 12 months. Otherwise he denies any other known triggers or recent changes in his health. He has a significant cardiac history of CAD and artificial aortic valve with normal EF on last echo. He is followed by cardiology.    Patient Stated Goals  "Improve my balance"    Currently in Pain?  No/denies        TREATMENT  Therapeutic Exercise Nustep Level 2- 5 minutes during history STS without UE support x10; CGA throughout STS with 10lb ball 3x10; CGA throughout Side stepping in // bars with blue band around forefoot to encourage forward toe placement x4 lengths each direction Standing hip extension blue band 2x10 each LE   Neuromuscular Re-education  Semi-tandem stance on airex without UE support alt LE3 x30s eachwith a tendency to experience LOBs to the R.  CGA provided throughout with intermittent  use of UE in // bars for steadying.   Pt educated throughout session about proper posture and technique with exercises. Improved exercise technique, movement at target joints, use of target muscles after min to mod verbal, visual, tactile cues.   Pt demonstrated excellent motivation during today's session. He demonstrates improvement with STSs without UE support, with the ability to increase the number of sets, as well as adding 10# while standing, demonstrating improvements in LE strength.  He was able to perform increased reps of sidestepping, but demonstrated fatigue towards the end of each pass in the // bars.  A blue theraband was utilized around his forefeet in order to encourage  forward facing toe placement and to increase activation of the glut med.  He demonstrates a tendency to experience LOBs to the R when performing semitandem stance, regardless of which leg is forward. CGA provided throughout session for safety. Pt will benefit from PT services to address deficits in strength, balance, and mobility in order to return to full function at home.       PT Short Term Goals - 03/19/19 1720      PT SHORT TERM GOAL #1   Title  Pt will be independent with HEP in order to improve strength and balance in order to decrease fall risk and improve function at home.    Time  6    Period  Weeks    Status  Achieved    Target Date  03/26/19        PT Long Term Goals - 03/21/19 1410      PT LONG TERM GOAL #1   Title  Pt will improve BERG by at least 3 points in order to demonstrate clinically significant improvement in balance.    Baseline  02/12/19: 49/56; 03/19/19: 52/56    Time  12    Period  Weeks    Status  Achieved    Target Date  05/07/19      PT LONG TERM GOAL #2   Title  Pt will improve DGI by at least 3 points in order to demonstrate clinically significant improvement in balance and decreased risk for falls.    Baseline  02/12/19: 20/24; 03/19/19: 20/24    Time  12    Period  Weeks    Status  Partially Met    Target Date  05/07/19      PT LONG TERM GOAL #3   Title  Pt will decrease 5TSTS by at least 3 seconds in order to demonstrate clinically significant improvement in LE strength.    Baseline  02/12/19: 19.0s; 03/19/19: 17.15sec    Time  12    Period  Weeks    Status  Partially Met    Target Date  05/07/19      PT LONG TERM GOAL #4   Title  Pt will improve ABC by at least 13% in order to demonstrate clinically significant improvement in balance confidence.    Baseline  02/12/19: 67.5%; 03/19/19: 72.5%    Time  12    Period  Weeks    Status  Partially Met    Target Date  05/07/19      PT LONG TERM GOAL #5   Title  Pt will decrease TUG to below 14  seconds/decrease in order to demonstrate decreased fall risk.    Baseline  02/12/19: 20.4s; 03/19/19: 16.25s    Time  12    Period  Weeks    Status  Partially Met  Target Date  05/07/19      PT LONG TERM GOAL #6   Title  Pt will increased 6MWT by at least 34m(16104f in order to demonstrate clinically significant improvement in cardiopulmonary endurance and community ambulation.    Baseline  02/21/19: 62512f8/13/20: 800f28f Time  12    Period  Weeks    Status  Achieved    Target Date  05/07/19            Plan - 04/09/19 1550    Clinical Impression Statement  Pt demonstrated excellent motivation during today's session. He demonstrates improvement with STSs without UE support, with the ability to increase the number of sets, as well as adding 10# while standing, demonstrating improvements in LE strength.  He was able to perform increased reps of sidestepping, but demonstrated fatigue towards the end of each pass in the // bars.  A blue theraband was utilized around his forefeet in order to encourage forward facing toe placement and to increase activation of the glut med.  He demonstrates a tendency to experience LOBs to the R when performing semitandem stance, regardless of which leg is forward. CGA provided throughout session for safety. Pt will benefit from PT services to address deficits in strength, balance, and mobility in order to return to full function at home.    Personal Factors and Comorbidities  Age;Fitness;Comorbidity 1;Comorbidity 2;Time since onset of injury/illness/exacerbation    Comorbidities  Depression, CAD    Examination-Activity Limitations  Caring for Others;Squat;Stairs;Stand    Examination-Participation Restrictions  Cleaning;Community Activity;Yard Work;Shop    Stability/Clinical Decision Making  Evolving/Moderate complexity    Rehab Potential  Good    PT Frequency  2x / week    PT Duration  12 weeks    PT Treatment/Interventions  ADLs/Self Care Home  Management;Aquatic Therapy;Biofeedback;Canalith Repostioning;Cryotherapy;Electrical Stimulation;Iontophoresis 4mg/58mDexamethasone;Moist Heat;Traction;Ultrasound;DME Instruction;Gait training;Stair training;Functional mobility training;Therapeutic activities;Therapeutic exercise;Balance training;Neuromuscular re-education;Cognitive remediation;Patient/family education;Manual techniques;Vestibular    PT Next Visit Plan  increase step length, arm swing and cadence; progress strengthening and balance    PT Home Exercise Plan  Medbridge Access Code: [email or web address] (Semitandem balance and sit to stand repeats)    Consulted and Agree with Plan of Care  Patient       Patient will benefit from skilled therapeutic intervention in order to improve the following deficits and impairments:  Abnormal gait, Cardiopulmonary status limiting activity, Decreased activity tolerance, Decreased balance, Decreased endurance, Decreased strength, Difficulty walking  Visit Diagnosis: Unsteadiness on feet  Muscle weakness (generalized)     Problem List Patient Active Problem List   Diagnosis Date Noted  . Malignant neoplastic disease (HCC) [place]20/2016  . Hypercholesterolemia 10/15/2014  . Essential (primary) hypertension 06/02/2014  . Acute blood loss anemia 01/25/2013  . Bloody pleural effusion 01/25/2013  . Pain following surgery or procedure 01/25/2013  . H/O aortic valve replacement 01/25/2013  . H/O coronary artery bypass surgery 01/25/2013  . History of open heart surgery 01/25/2013  . Angina, class II (HCC) [place]17/2014  . Heart failure (HCC) [place]17/2014  . 3-vessel CAD 01/17/2013  . Aortic heart valve narrowing 01/17/2013   This entire session was performed under direct supervision and direction of a licensed therapist/therapist assistant . I have personally read, edited and approve of the note as written.   TracyLutricia Horsfall JasonPhillips GroutDPT, GCS  Huprich,Jason 04/10/2019, 9:50 AM  [facility] [id number]: [phone]  Fax:  [phone]  Name: Bradley Nelson MRN: [id number] Date of Birth: October 13, 1931

## 2019-04-11 ENCOUNTER — Encounter: Payer: Self-pay | Admitting: Physical Therapy

## 2019-04-11 ENCOUNTER — Ambulatory Visit: Payer: PPO | Admitting: Physical Therapy

## 2019-04-11 ENCOUNTER — Other Ambulatory Visit: Payer: Self-pay

## 2019-04-11 DIAGNOSIS — R2681 Unsteadiness on feet: Secondary | ICD-10-CM

## 2019-04-11 DIAGNOSIS — Z9181 History of falling: Secondary | ICD-10-CM

## 2019-04-11 DIAGNOSIS — M6281 Muscle weakness (generalized): Secondary | ICD-10-CM

## 2019-04-11 DIAGNOSIS — R278 Other lack of coordination: Secondary | ICD-10-CM

## 2019-04-11 NOTE — Patient Instructions (Signed)
Access Code: [email or web address]  URL: [email or web address]  Date: 04/11/2019  Prepared by: Blanche East   Exercises  Backward Walking with Counter Support - 5 reps - 2 sets - 1x daily - 7x weekly  Standing Marching - 1 reps - 2 sets - 1x daily - 7x weekly  Romberg Stance with Head Nods - 10 reps - 2 sets - 1x daily - 7x weekly  Standing with Head Rotation - 10 reps - 2 sets - 1x daily - 7x weekly

## 2019-04-11 NOTE — Therapy (Signed)
[place] MAIN [facility] SERVICES [address] [place], [place], [address] Phone: [phone]   Fax:  [phone]  Physical Therapy Treatment  Patient Details  Name: Bradley Nelson MRN: [id number] Date of Birth: 1932-02-03 Referring Provider (PT): Dr Velna Ochs   Encounter Date: 04/11/2019  PT End of Session - 04/11/19 1109    Visit Number  16    Number of Visits  25    Date for PT Re-Evaluation  05/07/19    Authorization Type  eval 02/12/19, goals updated 03/21/19    PT Start Time  1102    PT Stop Time  1145    PT Time Calculation (min)  43 min    Equipment Utilized During Treatment  Gait belt    Activity Tolerance  Patient tolerated treatment well;Other (comment)   BP   Behavior During Therapy  WFL for tasks assessed/performed       Past Medical History:  Diagnosis Date  . Acute blood loss anemia 01/25/2013  . Angina, class II ([place]) 01/22/2013  . Aortic heart valve narrowing 01/17/2013  . Bloody pleural effusion 01/25/2013  . Essential (primary) hypertension 06/02/2014  . H/O aortic valve replacement 01/25/2013  . H/O coronary artery bypass surgery 01/25/2013   Overview:  cabg x3 with LIMA to LAD, SVG to PDA, SVG to left circumflex with septal muomectomy and aortic valve replacement with Ce Magna bioprosthetic valve on 01-24-13 by [name] at [facility].   . Heart failure ([place]) 01/22/2013  . History of open heart surgery 01/25/2013  . Hypercholesterolemia 10/15/2014  . Malignant neoplastic disease ([place]) 04/28/2015   Overview:  prostate cancer, s/p prostatectomy    . Prostate cancer ([place])   . Skin cancer     Past Surgical History:  Procedure Laterality Date  . AORTIC VALVE REPLACEMENT    . CARDIAC SURGERY    . CORONARY ARTERY BYPASS GRAFT    . HERNIA REPAIR    . PROSTATECTOMY  2001   Dr. [name]    There were no vitals filed for this visit.  Subjective Assessment - 04/11/19 1107    Subjective  Patient reports that he is doing okay; No  new falls; Reports working on sit<>stands but has not been able to tandem stand yet. He reports walking at home from house to mailbox about 3-4x daily;    Pertinent History  Pt reports that he has been struggling with his balance since his wife passed approximately 1 year ago. He states that he has had "1-2 falls" in the last 12 months. Otherwise he denies any other known triggers or recent changes in his health. He has a significant cardiac history of CAD and artificial aortic valve with normal EF on last echo. He is followed by cardiology.    Patient Stated Goals  "Improve my balance"    Currently in Pain?  No/denies    Multiple Pain Sites  No           TREATMENT Neuromuscular Re-education Warm up on Nustep BUE/BLE level 2 x5 min with HEP instruction:   Patient instructed in advanced balance exercise  Standing in parallel bars:  Standing on airex foam: -mini squat unsupported x10 reps with cues for reaching forward for better stance control; -modified tandem stance: head turns side/side x5 reps each foot in front; Patient required min VCs for balance stability, including to increase trunk control for less loss of balance with smaller base of support  Standing on 1/2 foam: (Flat side  up) -heel/toe rocks with feet apart heel/toe rocks x15 with rail assist for safety -feet apart, BUE wand flexion x10 reps with CGA for safety and cues to improve ankle strategies for better stance control -tandem stance with 2-0 rail assist 10 sec hold x2 reps each foot in front with CGA to min A for safety and cues to improve erect posture and increase weight shift for better stance control   Gait outside in hallway: Forward walking with head turns up/down, side/side x80 feet each Forward/backward walking directional change x80 feet Patient required mod VCs to increase step length and improve foot clearance especially with head turns. Required cues to improve gaze stabilization with head turns to  reduce dizziness and reduce veering side/side.  Advanced HEP: Forward/backward walking on firm surface x10 feet x 2 laps with intermittent HHA Alternate high knee march with 1 HHA slowly x1 min with cues to slow down LE movement to facilitate better weight shift and challenge SLS; Feet together head turns side/side, up/down x10 reps each unsupported; Patient able to exhibit good stance control;    Response to treatment: Patient often reaches out to bars/support for balance recovery, exhibiting decreased hip/ankle strategies. He requires cues for proper weight shift for improved stance control and balance. Patient reports mild fatigue at end of session; He verbalized and demonstrated independence in HEP;                       PT Education - 04/11/19 1109    Education Details  balance/HEP reinforced;    Person(s) Educated  Patient    Methods  Explanation;Verbal cues    Comprehension  Verbalized understanding;Returned demonstration;Verbal cues required;Need further instruction       PT Short Term Goals - 03/19/19 1720      PT SHORT TERM GOAL #1   Title  Pt will be independent with HEP in order to improve strength and balance in order to decrease fall risk and improve function at home.    Time  6    Period  Weeks    Status  Achieved    Target Date  03/26/19        PT Long Term Goals - 03/21/19 1410      PT LONG TERM GOAL #1   Title  Pt will improve BERG by at least 3 points in order to demonstrate clinically significant improvement in balance.    Baseline  02/12/19: 49/56; 03/19/19: 52/56    Time  12    Period  Weeks    Status  Achieved    Target Date  05/07/19      PT LONG TERM GOAL #2   Title  Pt will improve DGI by at least 3 points in order to demonstrate clinically significant improvement in balance and decreased risk for falls.    Baseline  02/12/19: 20/24; 03/19/19: 20/24    Time  12    Period  Weeks    Status  Partially Met    Target Date  05/07/19       PT LONG TERM GOAL #3   Title  Pt will decrease 5TSTS by at least 3 seconds in order to demonstrate clinically significant improvement in LE strength.    Baseline  02/12/19: 19.0s; 03/19/19: 17.15sec    Time  12    Period  Weeks    Status  Partially Met    Target Date  05/07/19      PT LONG TERM GOAL #4  Title  Pt will improve ABC by at least 13% in order to demonstrate clinically significant improvement in balance confidence.    Baseline  02/12/19: 67.5%; 03/19/19: 72.5%    Time  12    Period  Weeks    Status  Partially Met    Target Date  05/07/19      PT LONG TERM GOAL #5   Title  Pt will decrease TUG to below 14 seconds/decrease in order to demonstrate decreased fall risk.    Baseline  02/12/19: 20.4s; 03/19/19: 16.25s    Time  12    Period  Weeks    Status  Partially Met    Target Date  05/07/19      PT LONG TERM GOAL #6   Title  Pt will increased 6MWT by at least 51m(1623f in order to demonstrate clinically significant improvement in cardiopulmonary endurance and community ambulation.    Baseline  02/21/19: 6256f8/13/20: 800f16f Time  12    Period  Weeks    Status  Achieved    Target Date  05/07/19            Plan - 04/11/19 1241    Clinical Impression Statement  Patient instructed in advanced balance exercise. Advanced HEP with instruction in balance exercise. He does require min Vcs for proper stance control and weight shift. Patient reinforced in HEP with instruction to increase adherence for better balance progression. Patient fatigued at end of session. he would benefit from additional skilled PT intervention to improve balance/strength and mobility;    Personal Factors and Comorbidities  Age;Fitness;Comorbidity 1;Comorbidity 2;Time since onset of injury/illness/exacerbation    Comorbidities  Depression, CAD    Examination-Activity Limitations  Caring for Others;Squat;Stairs;Stand    Examination-Participation Restrictions  Cleaning;Community Activity;Yard  Work;Shop    Stability/Clinical Decision Making  Evolving/Moderate complexity    Rehab Potential  Good    PT Frequency  2x / week    PT Duration  12 weeks    PT Treatment/Interventions  ADLs/Self Care Home Management;Aquatic Therapy;Biofeedback;Canalith Repostioning;Cryotherapy;Electrical Stimulation;Iontophoresis 4mg/39mDexamethasone;Moist Heat;Traction;Ultrasound;DME Instruction;Gait training;Stair training;Functional mobility training;Therapeutic activities;Therapeutic exercise;Balance training;Neuromuscular re-education;Cognitive remediation;Patient/family education;Manual techniques;Vestibular    PT Next Visit Plan  increase step length, arm swing and cadence; progress strengthening and balance    PT Home Exercise Plan  Medbridge Access Code: [email or web address] (Semitandem balance and sit to stand repeats)    Consulted and Agree with Plan of Care  Patient       Patient will benefit from skilled therapeutic intervention in order to improve the following deficits and impairments:  Abnormal gait, Cardiopulmonary status limiting activity, Decreased activity tolerance, Decreased balance, Decreased endurance, Decreased strength, Difficulty walking  Visit Diagnosis: Unsteadiness on feet  Muscle weakness (generalized)  History of falling  Other lack of coordination     Problem List Patient Active Problem List   Diagnosis Date Noted  . Malignant neoplastic disease (HCC) [place]20/2016  . Hypercholesterolemia 10/15/2014  . Essential (primary) hypertension 06/02/2014  . Acute blood loss anemia 01/25/2013  . Bloody pleural effusion 01/25/2013  . Pain following surgery or procedure 01/25/2013  . H/O aortic valve replacement 01/25/2013  . H/O coronary artery bypass surgery 01/25/2013  . History of open heart surgery 01/25/2013  . Angina, class II (HCC) [place]17/2014  . Heart failure (HCC) [place]17/2014  . 3-vessel CAD 01/17/2013  . Aortic heart valve narrowing 01/17/2013    Trotter,Margaret PT,  DPT 04/11/2019, 12:43 PM  [facility]  [place], [place], [address] Phone: [phone]   Fax:  [phone]  Name: Bradley Nelson MRN: [id number] Date of Birth: 1931-08-18

## 2019-04-16 ENCOUNTER — Ambulatory Visit: Payer: PPO

## 2019-04-16 ENCOUNTER — Other Ambulatory Visit: Payer: Self-pay

## 2019-04-16 DIAGNOSIS — Z20822 Contact with and (suspected) exposure to covid-19: Secondary | ICD-10-CM

## 2019-04-17 LAB — NOVEL CORONAVIRUS, NAA: SARS-CoV-2, NAA: NOT DETECTED

## 2019-04-18 ENCOUNTER — Ambulatory Visit: Payer: PPO | Admitting: Physical Therapy

## 2019-04-22 DIAGNOSIS — H40003 Preglaucoma, unspecified, bilateral: Secondary | ICD-10-CM | POA: Diagnosis not present

## 2019-04-23 ENCOUNTER — Other Ambulatory Visit: Payer: Self-pay

## 2019-04-23 ENCOUNTER — Ambulatory Visit: Payer: PPO

## 2019-04-23 DIAGNOSIS — Z9181 History of falling: Secondary | ICD-10-CM

## 2019-04-23 DIAGNOSIS — R2681 Unsteadiness on feet: Secondary | ICD-10-CM

## 2019-04-23 DIAGNOSIS — M6281 Muscle weakness (generalized): Secondary | ICD-10-CM

## 2019-04-23 NOTE — Therapy (Signed)
[place] MAIN [facility] SERVICES [address] [place], [place], [address] Phone: [phone]   Fax:  [phone]  Physical Therapy Treatment  Patient Details  Name: TERENCE BART MRN: [id number] Date of Birth: 07/09/1932 Referring Provider (PT): Dr Velna Ochs   Encounter Date: 04/23/2019  PT End of Session - 04/23/19 1521    Visit Number  17    Number of Visits  25    Date for PT Re-Evaluation  05/07/19    Authorization Type  eval 02/12/19, goals updated 03/21/19    PT Start Time  1521    PT Stop Time  1600    PT Time Calculation (min)  39 min    Equipment Utilized During Treatment  Gait belt    Activity Tolerance  Patient tolerated treatment well;Other (comment)   BP   Behavior During Therapy  WFL for tasks assessed/performed       Past Medical History:  Diagnosis Date  . Acute blood loss anemia 01/25/2013  . Angina, class II ([place]) 01/22/2013  . Aortic heart valve narrowing 01/17/2013  . Bloody pleural effusion 01/25/2013  . Essential (primary) hypertension 06/02/2014  . H/O aortic valve replacement 01/25/2013  . H/O coronary artery bypass surgery 01/25/2013   Overview:  cabg x3 with LIMA to LAD, SVG to PDA, SVG to left circumflex with septal muomectomy and aortic valve replacement with Ce Magna bioprosthetic valve on 01-24-13 by [name] at [facility].   . Heart failure ([place]) 01/22/2013  . History of open heart surgery 01/25/2013  . Hypercholesterolemia 10/15/2014  . Malignant neoplastic disease ([place]) 04/28/2015   Overview:  prostate cancer, s/p prostatectomy    . Prostate cancer ([place])   . Skin cancer     Past Surgical History:  Procedure Laterality Date  . AORTIC VALVE REPLACEMENT    . CARDIAC SURGERY    . CORONARY ARTERY BYPASS GRAFT    . HERNIA REPAIR    . PROSTATECTOMY  2001   Dr. [name]    There were no vitals filed for this visit.  Subjective Assessment - 04/23/19 1526    Subjective  Patient reports that he is doing well.  No new falls since last visit.  No new questions or concerns at this time.    Pertinent History  Pt reports that he has been struggling with his balance since his wife passed approximately 1 year ago. He states that he has had "1-2 falls" in the last 12 months. Otherwise he denies any other known triggers or recent changes in his health. He has a significant cardiac history of CAD and artificial aortic valve with normal EF on last echo. He is followed by cardiology.    Patient Stated Goals  "Improve my balance"    Currently in Pain?  No/denies        TREATMENT  Therapeutic Exercise Nustep Level 2- 5 minutes during history  STS from airex pad without UE support 2x10; intermittent SUE through L thigh to come to stand.  CGA throughout  Standing hip abduction with green tband around BLE x10 performed bilaterally   Standing hip extension green band 2x10 each LE with band around BLE  Gait with a focus on big steps 75' x6; able to complete 23' in 47 steps initially, progressing to 41 steps, and then regressing to 44 steps when fatigue set in; min/mod verbal cueing provided to take big steps   Neuromuscular Re-education  Toe taps onto side of treadmill while standing on  airex pad x10 on each leg.  More difficulty with R hip flexion   Semi-tandem stance on airex without UE support alt LE3 x30s each. CGA with occasional minA provided throughout for intermittent steadying.   Pt educated throughout session about proper posture and technique with exercises. Improved exercise technique, movement at target joints, use of target muscles after min to mod verbal, visual, tactile cues.   Pt demonstratedexcellent motivation during today's session.  He demonstrated difficulty with STSs from an airex pad today, requiring SUE through his L thigh on about 75% of his reps.  He was able to consistently perform gait with big steps for 4 of his 6 reps, demonstrating the ability to complete 75 ft in  41 steps.  Pt will benefit from PT services to address deficits in strength, balance, and mobility in order to return to full function at home         PT Short Term Goals - 03/19/19 1720      PT SHORT TERM GOAL #1   Title  Pt will be independent with HEP in order to improve strength and balance in order to decrease fall risk and improve function at home.    Time  6    Period  Weeks    Status  Achieved    Target Date  03/26/19        PT Long Term Goals - 03/21/19 1410      PT LONG TERM GOAL #1   Title  Pt will improve BERG by at least 3 points in order to demonstrate clinically significant improvement in balance.    Baseline  02/12/19: 49/56; 03/19/19: 52/56    Time  12    Period  Weeks    Status  Achieved    Target Date  05/07/19      PT LONG TERM GOAL #2   Title  Pt will improve DGI by at least 3 points in order to demonstrate clinically significant improvement in balance and decreased risk for falls.    Baseline  02/12/19: 20/24; 03/19/19: 20/24    Time  12    Period  Weeks    Status  Partially Met    Target Date  05/07/19      PT LONG TERM GOAL #3   Title  Pt will decrease 5TSTS by at least 3 seconds in order to demonstrate clinically significant improvement in LE strength.    Baseline  02/12/19: 19.0s; 03/19/19: 17.15sec    Time  12    Period  Weeks    Status  Partially Met    Target Date  05/07/19      PT LONG TERM GOAL #4   Title  Pt will improve ABC by at least 13% in order to demonstrate clinically significant improvement in balance confidence.    Baseline  02/12/19: 67.5%; 03/19/19: 72.5%    Time  12    Period  Weeks    Status  Partially Met    Target Date  05/07/19      PT LONG TERM GOAL #5   Title  Pt will decrease TUG to below 14 seconds/decrease in order to demonstrate decreased fall risk.    Baseline  02/12/19: 20.4s; 03/19/19: 16.25s    Time  12    Period  Weeks    Status  Partially Met    Target Date  05/07/19      PT LONG TERM GOAL #6   Title  Pt will  increased 6MWT by at  least 10m(1614f in order to demonstrate clinically significant improvement in cardiopulmonary endurance and community ambulation.    Baseline  02/21/19: 62518f8/13/20: 800f29f Time  12    Period  Weeks    Status  Achieved    Target Date  05/07/19            Plan - 04/23/19 1610    Clinical Impression Statement  Pt demonstrated excellent motivation during today's session.  He demonstrated difficulty with STSs from an airex pad today, requiring SUE through his L thigh on about 75% of his reps.  He was able to consistently perform gait with big steps for 4 of his 6 reps, demonstrating the ability to complete 75 ft in 41 steps.  Pt will benefit from PT services to address deficits in strength, balance, and mobility in order to return to full function at home    Personal Factors and Comorbidities  Age;Fitness;Comorbidity 1;Comorbidity 2;Time since onset of injury/illness/exacerbation    Comorbidities  Depression, CAD    Examination-Activity Limitations  Caring for Others;Squat;Stairs;Stand    Examination-Participation Restrictions  Cleaning;Community Activity;Yard Work;Shop    Stability/Clinical Decision Making  Evolving/Moderate complexity    Rehab Potential  Good    PT Frequency  2x / week    PT Duration  12 weeks    PT Treatment/Interventions  ADLs/Self Care Home Management;Aquatic Therapy;Biofeedback;Canalith Repostioning;Cryotherapy;Electrical Stimulation;Iontophoresis 4mg/91mDexamethasone;Moist Heat;Traction;Ultrasound;DME Instruction;Gait training;Stair training;Functional mobility training;Therapeutic activities;Therapeutic exercise;Balance training;Neuromuscular re-education;Cognitive remediation;Patient/family education;Manual techniques;Vestibular    PT Next Visit Plan  increase step length and cadence; progress strengthening and balance    PT Home Exercise Plan  Medbridge Access Code: [email or web address] balance and sit to stand repeats)    Consulted and  Agree with Plan of Care  Patient       Patient will benefit from skilled therapeutic intervention in order to improve the following deficits and impairments:  Abnormal gait, Cardiopulmonary status limiting activity, Decreased activity tolerance, Decreased balance, Decreased endurance, Decreased strength, Difficulty walking  Visit Diagnosis: Unsteadiness on feet  Muscle weakness (generalized)  History of falling     Problem List Patient Active Problem List   Diagnosis Date Noted  . Malignant neoplastic disease (HCC) [place]20/2016  . Hypercholesterolemia 10/15/2014  . Essential (primary) hypertension 06/02/2014  . Acute blood loss anemia 01/25/2013  . Bloody pleural effusion 01/25/2013  . Pain following surgery or procedure 01/25/2013  . H/O aortic valve replacement 01/25/2013  . H/O coronary artery bypass surgery 01/25/2013  . History of open heart surgery 01/25/2013  . Angina, class II (HCC) [place]17/2014  . Heart failure (HCC) [place]17/2014  . 3-vessel CAD 01/17/2013  . Aortic heart valve narrowing 01/17/2013    This entire session was performed under direct supervision and direction of a licensed therapist/therapist assistant . I have personally read, edited and approve of the note as written.   TracyLutricia Horsfall JasonPhillips GroutDPT, GCS  Huprich,Jason 04/24/2019, 2:41 PM  [facility] [id number]: [phone]:  [phone]: MelviGIANLUCAS EVENSON [phone] of Birth: 5/3/108/05/1932

## 2019-04-25 ENCOUNTER — Encounter: Payer: Self-pay | Admitting: Physical Therapy

## 2019-04-25 ENCOUNTER — Other Ambulatory Visit: Payer: Self-pay

## 2019-04-25 ENCOUNTER — Ambulatory Visit: Payer: PPO | Admitting: Physical Therapy

## 2019-04-25 DIAGNOSIS — Z9181 History of falling: Secondary | ICD-10-CM

## 2019-04-25 DIAGNOSIS — R2681 Unsteadiness on feet: Secondary | ICD-10-CM | POA: Diagnosis not present

## 2019-04-25 DIAGNOSIS — M6281 Muscle weakness (generalized): Secondary | ICD-10-CM

## 2019-04-25 DIAGNOSIS — R278 Other lack of coordination: Secondary | ICD-10-CM

## 2019-04-25 NOTE — Therapy (Signed)
[place] MAIN [facility] SERVICES [address] [place], [place], [address] Phone: [phone]   Fax:  [phone]  Physical Therapy Treatment  Patient Details  Name: Bradley Nelson MRN: [id number] Date of Birth: 07-May-1932 Referring Provider (PT): Dr Velna Ochs   Encounter Date: 04/25/2019  PT End of Session - 04/25/19 1106    Visit Number  18    Number of Visits  25    Date for PT Re-Evaluation  05/07/19    Authorization Type  eval 02/12/19, goals updated 03/21/19    PT Start Time  1102    PT Stop Time  1145    PT Time Calculation (min)  43 min    Equipment Utilized During Treatment  Gait belt    Activity Tolerance  Patient tolerated treatment well;Other (comment)   BP   Behavior During Therapy  WFL for tasks assessed/performed       Past Medical History:  Diagnosis Date  . Acute blood loss anemia 01/25/2013  . Angina, class II ([place]) 01/22/2013  . Aortic heart valve narrowing 01/17/2013  . Bloody pleural effusion 01/25/2013  . Essential (primary) hypertension 06/02/2014  . H/O aortic valve replacement 01/25/2013  . H/O coronary artery bypass surgery 01/25/2013   Overview:  cabg x3 with LIMA to LAD, SVG to PDA, SVG to left circumflex with septal muomectomy and aortic valve replacement with Ce Magna bioprosthetic valve on 01-24-13 by [name] at [facility].   . Heart failure ([place]) 01/22/2013  . History of open heart surgery 01/25/2013  . Hypercholesterolemia 10/15/2014  . Malignant neoplastic disease ([place]) 04/28/2015   Overview:  prostate cancer, s/p prostatectomy    . Prostate cancer ([place])   . Skin cancer     Past Surgical History:  Procedure Laterality Date  . AORTIC VALVE REPLACEMENT    . CARDIAC SURGERY    . CORONARY ARTERY BYPASS GRAFT    . HERNIA REPAIR    . PROSTATECTOMY  2001   Dr. [name]    There were no vitals filed for this visit.  Subjective Assessment - 04/25/19 1105    Subjective  Patient reports doing well; Denies any  new falls; He reports doing yardwork at home. He repots mixed adherence with HEP stating that he is only doing a few of them.    Pertinent History  Pt reports that he has been struggling with his balance since his wife passed approximately 1 year ago. He states that he has had "1-2 falls" in the last 12 months. Otherwise he denies any other known triggers or recent changes in his health. He has a significant cardiac history of CAD and artificial aortic valve with normal EF on last echo. He is followed by cardiology.    Patient Stated Goals  "Improve my balance"    Currently in Pain?  No/denies    Multiple Pain Sites  No       Therapeutic Exercise Warm up on Cross trainer, level 2 x4 min (Unbilled);  STS from regular chair without UE support x10 holding ball with overhead lift CGA throughout  Standing hip abduction with green tband around BLE x10 performed bilaterally, required rail assist for safety;  Standing hip extension green bandx10each LE with band around BLE Side stepping with green tband around BLE x10 feet x 2 laps each direction; Patient required min-moderate verbal/tactile cues for correct exercise technique including cues to avoid lean and to improve foot placement for better hip strengthening;  Neuromuscular Re-education Standing on airex pad:  Toe taps onto 4" step while standing on airex pad x15 on each leg.  More difficulty with R hip flexion   Feet together stance on airex without UE support   x30 sec with eyes open followed by x10 sec with eyes closed, 3 sets; cues to utilize core muscles to prevent loss of balance.  Unsupported balloon taps x2 min x2 sets; with min A for safety; With increased repetition patient often steps apart to help keep balance; He was able to reach outside base of support well without loss of balance;   Alternating semi-tandem stance on airex without UE support left and right turns x5 followed by looking up and down x5; CGA with  occasional minA provided throughout for intermittent steadying.    Pt educated throughout session about proper posture and technique with exercises. Improved exercise technique, movement at target joints, use of target muscles after min to mod verbal, visual, tactile cues.   Pt demonstratedexcellent motivation during today's session.He reports mild fatigue requiring intermittent rest breaks. Reinforced importance of HEP adherence for improved strength and mobility;                         PT Education - 04/25/19 1202    Education Details  balance/strengthening, HEP reinforced;    Person(s) Educated  Patient    Methods  Explanation;Verbal cues    Comprehension  Verbalized understanding;Returned demonstration;Verbal cues required;Need further instruction       PT Short Term Goals - 03/19/19 1720      PT SHORT TERM GOAL #1   Title  Pt will be independent with HEP in order to improve strength and balance in order to decrease fall risk and improve function at home.    Time  6    Period  Weeks    Status  Achieved    Target Date  03/26/19        PT Long Term Goals - 03/21/19 1410      PT LONG TERM GOAL #1   Title  Pt will improve BERG by at least 3 points in order to demonstrate clinically significant improvement in balance.    Baseline  02/12/19: 49/56; 03/19/19: 52/56    Time  12    Period  Weeks    Status  Achieved    Target Date  05/07/19      PT LONG TERM GOAL #2   Title  Pt will improve DGI by at least 3 points in order to demonstrate clinically significant improvement in balance and decreased risk for falls.    Baseline  02/12/19: 20/24; 03/19/19: 20/24    Time  12    Period  Weeks    Status  Partially Met    Target Date  05/07/19      PT LONG TERM GOAL #3   Title  Pt will decrease 5TSTS by at least 3 seconds in order to demonstrate clinically significant improvement in LE strength.    Baseline  02/12/19: 19.0s; 03/19/19: 17.15sec    Time  12     Period  Weeks    Status  Partially Met    Target Date  05/07/19      PT LONG TERM GOAL #4   Title  Pt will improve ABC by at least 13% in order to demonstrate clinically significant improvement in balance confidence.    Baseline  02/12/19: 67.5%; 03/19/19: 72.5%    Time  12  Period  Weeks    Status  Partially Met    Target Date  05/07/19      PT LONG TERM GOAL #5   Title  Pt will decrease TUG to below 14 seconds/decrease in order to demonstrate decreased fall risk.    Baseline  02/12/19: 20.4s; 03/19/19: 16.25s    Time  12    Period  Weeks    Status  Partially Met    Target Date  05/07/19      PT LONG TERM GOAL #6   Title  Pt will increased 6MWT by at least 31m(1656f in order to demonstrate clinically significant improvement in cardiopulmonary endurance and community ambulation.    Baseline  02/21/19: 62568f8/13/20: 800f110f Time  12    Period  Weeks    Status  Achieved    Target Date  05/07/19            Plan - 04/25/19 1203    Clinical Impression Statement  Pt tolerated session well. He was instructed in advanced LE strengthening; Patient requires min Vcs for proper positioning and exercise technique particularly with tband exercise to isolate proper muscle activation for strengthening. Patient does fatigue with prolonged standing requiring intermittent seated rest breaks. Patient instructed in advanced balance exercise. He does have difficulty standing on compliant surfaces especially without rail assist and narrow base of support. He would benefit from additional skilled PT intervention to improve strength, balance and gait safety;    Personal Factors and Comorbidities  Age;Fitness;Comorbidity 1;Comorbidity 2;Time since onset of injury/illness/exacerbation    Comorbidities  Depression, CAD    Examination-Activity Limitations  Caring for Others;Squat;Stairs;Stand    Examination-Participation Restrictions  Cleaning;Community Activity;Yard Work;Shop    Stability/Clinical  Decision Making  Evolving/Moderate complexity    Rehab Potential  Good    PT Frequency  2x / week    PT Duration  12 weeks    PT Treatment/Interventions  ADLs/Self Care Home Management;Aquatic Therapy;Biofeedback;Canalith Repostioning;Cryotherapy;Electrical Stimulation;Iontophoresis 4mg/27mDexamethasone;Moist Heat;Traction;Ultrasound;DME Instruction;Gait training;Stair training;Functional mobility training;Therapeutic activities;Therapeutic exercise;Balance training;Neuromuscular re-education;Cognitive remediation;Patient/family education;Manual techniques;Vestibular    PT Next Visit Plan  increase step length and cadence; progress strengthening and balance    PT Home Exercise Plan  Medbridge Access Code: [email or web address] balance and sit to stand repeats)    Consulted and Agree with Plan of Care  Patient       Patient will benefit from skilled therapeutic intervention in order to improve the following deficits and impairments:  Abnormal gait, Cardiopulmonary status limiting activity, Decreased activity tolerance, Decreased balance, Decreased endurance, Decreased strength, Difficulty walking  Visit Diagnosis: Unsteadiness on feet  Muscle weakness (generalized)  History of falling  Other lack of coordination     Problem List Patient Active Problem List   Diagnosis Date Noted  . Malignant neoplastic disease (HCC) [place]20/2016  . Hypercholesterolemia 10/15/2014  . Essential (primary) hypertension 06/02/2014  . Acute blood loss anemia 01/25/2013  . Bloody pleural effusion 01/25/2013  . Pain following surgery or procedure 01/25/2013  . H/O aortic valve replacement 01/25/2013  . H/O coronary artery bypass surgery 01/25/2013  . History of open heart surgery 01/25/2013  . Angina, class II (HCC) [place]17/2014  . Heart failure (HCC) [place]17/2014  . 3-vessel CAD 01/17/2013  . Aortic heart valve narrowing 01/17/2013    , PT, DPT 04/25/2019, 12:07 PM  [facility] Hospital[facility] [id number]: [phone]:  [phone]:  Bradley Nelson MRN: [id number] Date of Birth: 1932/05/28

## 2019-04-29 ENCOUNTER — Ambulatory Visit: Payer: PPO

## 2019-04-29 ENCOUNTER — Other Ambulatory Visit: Payer: Self-pay

## 2019-04-29 DIAGNOSIS — M6281 Muscle weakness (generalized): Secondary | ICD-10-CM

## 2019-04-29 DIAGNOSIS — R2681 Unsteadiness on feet: Secondary | ICD-10-CM | POA: Diagnosis not present

## 2019-04-29 DIAGNOSIS — Z9181 History of falling: Secondary | ICD-10-CM

## 2019-04-29 NOTE — Therapy (Signed)
[place] MAIN [facility] SERVICES [address] [place], [place], [address] Phone: [phone]   Fax:  [phone]  Physical Therapy Treatment  Patient Details  Name: Bradley Nelson MRN: [id number] Date of Birth: [birth year] Referring Provider (PT): Dr Velna Ochs   Encounter Date: 04/29/2019  PT End of Session - 04/29/19 0939    Visit Number  19    Number of Visits  25    Date for PT Re-Evaluation  05/07/19    Authorization Type  eval 02/12/19, goals updated 03/21/19    PT Start Time  0940    PT Stop Time  1025    PT Time Calculation (min)  45 min    Equipment Utilized During Treatment  Gait belt    Activity Tolerance  Patient tolerated treatment well;Other (comment)   BP   Behavior During Therapy  WFL for tasks assessed/performed       Past Medical History:  Diagnosis Date  . Acute blood loss anemia 01/25/2013  . Angina, class II ([place]) 01/22/2013  . Aortic heart valve narrowing 01/17/2013  . Bloody pleural effusion 01/25/2013  . Essential (primary) hypertension 06/02/2014  . H/O aortic valve replacement 01/25/2013  . H/O coronary artery bypass surgery 01/25/2013   Overview:  cabg x3 with LIMA to LAD, SVG to PDA, SVG to left circumflex with septal muomectomy and aortic valve replacement with Ce Magna bioprosthetic valve on 01-24-13 by [name] at [facility]   . Heart failure ([place]) 01/22/2013  . History of open heart surgery 01/25/2013  . Hypercholesterolemia 10/15/2014  . Malignant neoplastic disease ([place]) 04/28/2015   Overview:  prostate cancer, s/p prostatectomy    . Prostate cancer ([place])   . Skin cancer     Past Surgical History:  Procedure Laterality Date  . AORTIC VALVE REPLACEMENT    . CARDIAC SURGERY    . CORONARY ARTERY BYPASS GRAFT    . HERNIA REPAIR    . PROSTATECTOMY  2001   Dr. [name]    There were no vitals filed for this visit.  Subjective Assessment - 04/29/19 0943    Subjective  Patient reports doing well today, with  no soreness or pain today.  He states that he had generalized soreness yesterday. No falls since last visit. No new questions or concerns at this time.    Pertinent History  Pt reports that he has been struggling with his balance since his wife passed approximately 1 year ago. He states that he has had "1-2 falls" in the last 12 months. Otherwise he denies any other known triggers or recent changes in his health. He has a significant cardiac history of CAD and artificial aortic valve with normal EF on last echo. He is followed by cardiology.    Patient Stated Goals  "Improve my balance"    Currently in Pain?  No/denies         TREATMENT  Therapeutic Exercise    Warm up on NuStep, level 4 x5 min (Unbilled);    STS from regular chair without UE support2 x10 with airex pad underneath feet     Standing hip abduction with green tband around BLE 2 x10 performed bilaterally, required rail assist for safety;  CGA provided throughout.  Moderate cueing provided to keep toes facing forward and to focus on eccentric control of hip aductors       Neuromuscular Re-education    Toe taps onto side of treadmill while standing on airex pad 2x10 on each  leg.  More difficulty with R SLS   Step ups onto 6" box with airex pad on top 2x10.  MinA provided throughout with 2 instances of modA for LOB, when pt caught his R foot on the step.  Single leg stance on ground 3x30s on each foot more difficulty in R SLS                Pt educated throughout session about proper posture and technique with exercises. Improved exercise technique, movement at target joints, use of target muscles after min to mod verbal, visual, tactile cues.       Pt demonstrated excellent motivation during today's session.  He demonstrates improvement with STSs with an airex pad underneath his feet, requiring less UE support through his legs today.  He demonstrates more difficulty with R single leg stance activities, requiring  intermittent UE support and compensations to remain in stance, especially during single leg balance and with step ups.  Pt will benefit from PT services to address deficits in strength, balance, and mobility in order to return to full function at home               PT Short Term Goals - 03/19/19 1720      PT SHORT TERM GOAL #1   Title  Pt will be independent with HEP in order to improve strength and balance in order to decrease fall risk and improve function at home.    Time  6    Period  Weeks    Status  Achieved    Target Date  03/26/19        PT Long Term Goals - 03/21/19 1410      PT LONG TERM GOAL #1   Title  Pt will improve BERG by at least 3 points in order to demonstrate clinically significant improvement in balance.    Baseline  02/12/19: 49/56; 03/19/19: 52/56    Time  12    Period  Weeks    Status  Achieved    Target Date  05/07/19      PT LONG TERM GOAL #2   Title  Pt will improve DGI by at least 3 points in order to demonstrate clinically significant improvement in balance and decreased risk for falls.    Baseline  02/12/19: 20/24; 03/19/19: 20/24    Time  12    Period  Weeks    Status  Partially Met    Target Date  05/07/19      PT LONG TERM GOAL #3   Title  Pt will decrease 5TSTS by at least 3 seconds in order to demonstrate clinically significant improvement in LE strength.    Baseline  02/12/19: 19.0s; 03/19/19: 17.15sec    Time  12    Period  Weeks    Status  Partially Met    Target Date  05/07/19      PT LONG TERM GOAL #4   Title  Pt will improve ABC by at least 13% in order to demonstrate clinically significant improvement in balance confidence.    Baseline  02/12/19: 67.5%; 03/19/19: 72.5%    Time  12    Period  Weeks    Status  Partially Met    Target Date  05/07/19      PT LONG TERM GOAL #5   Title  Pt will decrease TUG to below 14 seconds/decrease in order to demonstrate decreased fall risk.    Baseline  02/12/19: 20.4s; 03/19/19: 16.25s  Time   12    Period  Weeks    Status  Partially Met    Target Date  05/07/19      PT LONG TERM GOAL #6   Title  Pt will increased 6MWT by at least 37m(1662f in order to demonstrate clinically significant improvement in cardiopulmonary endurance and community ambulation.    Baseline  02/21/19: 62558f8/13/20: 800f25f Time  12    Period  Weeks    Status  Achieved    Target Date  05/07/19            Plan - 04/29/19 1138    Clinical Impression Statement  Pt demonstrated excellent motivation during today's session.  He demonstrates improvement with STSs with an airex pad underneath his feet, requiring less UE support through his legs today.  He demonstrates more difficulty with R single leg stance activities, requiring intermittent UE support and compensations to remain in stance, especially during single leg balance and with step ups.  Pt will benefit from PT services to address deficits in strength, balance, and mobility in order to return to full function at home    Personal Factors and Comorbidities  Age;Fitness;Comorbidity 1;Comorbidity 2;Time since onset of injury/illness/exacerbation    Comorbidities  Depression, CAD    Examination-Activity Limitations  Caring for Others;Squat;Stairs;Stand    Examination-Participation Restrictions  Cleaning;Community Activity;Yard Work;Shop    Stability/Clinical Decision Making  Evolving/Moderate complexity    Rehab Potential  Good    PT Frequency  2x / week    PT Duration  12 weeks    PT Treatment/Interventions  ADLs/Self Care Home Management;Aquatic Therapy;Biofeedback;Canalith Repostioning;Cryotherapy;Electrical Stimulation;Iontophoresis 4mg/18mDexamethasone;Moist Heat;Traction;Ultrasound;DME Instruction;Gait training;Stair training;Functional mobility training;Therapeutic activities;Therapeutic exercise;Balance training;Neuromuscular re-education;Cognitive remediation;Patient/family education;Manual techniques;Vestibular    PT Next Visit Plan   increase step length and cadence; progress strengthening and balance    PT Home Exercise Plan  Medbridge Access Code: [email or web address] balance and sit to stand repeats)    Consulted and Agree with Plan of Care  Patient       Patient will benefit from skilled therapeutic intervention in order to improve the following deficits and impairments:  Abnormal gait, Cardiopulmonary status limiting activity, Decreased activity tolerance, Decreased balance, Decreased endurance, Decreased strength, Difficulty walking  Visit Diagnosis: Unsteadiness on feet  Muscle weakness (generalized)  History of falling     Problem List Patient Active Problem List   Diagnosis Date Noted  . Malignant neoplastic disease (HCC) [place]20/2016  . Hypercholesterolemia 10/15/2014  . Essential (primary) hypertension 06/02/2014  . Acute blood loss anemia 01/25/2013  . Bloody pleural effusion 01/25/2013  . Pain following surgery or procedure 01/25/2013  . H/O aortic valve replacement 01/25/2013  . H/O coronary artery bypass surgery 01/25/2013  . History of open heart surgery 01/25/2013  . Angina, class II (HCC) [place]17/2014  . Heart failure (HCC) [place]17/2014  . 3-vessel CAD 01/17/2013  . Aortic heart valve narrowing 01/17/2013    This entire session was performed under direct supervision and direction of a licensed therapist/therapist assistant . I have personally read, edited and approve of the note as written.   TracyLutricia Horsfall JasonPhillips GroutDPT, GCS  Huprich,Jason 04/30/2019, 9:13 AM  [facility] [address] [place] [id number]: [phone]:  [phone]: MelviMATTEW CHRISWELL [phone] of Birth: [birth year]

## 2019-05-01 ENCOUNTER — Other Ambulatory Visit: Payer: Self-pay

## 2019-05-01 ENCOUNTER — Ambulatory Visit: Payer: PPO

## 2019-05-01 DIAGNOSIS — Z9181 History of falling: Secondary | ICD-10-CM

## 2019-05-01 DIAGNOSIS — R2681 Unsteadiness on feet: Secondary | ICD-10-CM | POA: Diagnosis not present

## 2019-05-01 DIAGNOSIS — M6281 Muscle weakness (generalized): Secondary | ICD-10-CM

## 2019-05-01 NOTE — Therapy (Signed)
[place] MAIN [facility] SERVICES [address] [place], [place], [address] Phone: [phone]   Fax:  [phone]   Physical Therapy Progress Note   Dates of reporting period  03/21/19   to   05/01/19   Patient Details  Name: Bradley Nelson MRN: [id number] Date of Birth: 1931/08/27 Referring Provider (PT): Dr Velna Ochs   Encounter Date: 05/01/2019  PT End of Session - 05/01/19 1229    Visit Number  20    Number of Visits  25    Date for PT Re-Evaluation  05/07/19    Authorization Type  eval 02/12/19, goals updated 05/01/19    PT Start Time  1300    PT Stop Time  1345    PT Time Calculation (min)  45 min    Equipment Utilized During Treatment  Gait belt    Activity Tolerance  Patient tolerated treatment well;Other (comment)   BP   Behavior During Therapy  WFL for tasks assessed/performed       Past Medical History:  Diagnosis Date  . Acute blood loss anemia 01/25/2013  . Angina, class II ([place]) 01/22/2013  . Aortic heart valve narrowing 01/17/2013  . Bloody pleural effusion 01/25/2013  . Essential (primary) hypertension 06/02/2014  . H/O aortic valve replacement 01/25/2013  . H/O coronary artery bypass surgery 01/25/2013   Overview:  cabg x3 with LIMA to LAD, SVG to PDA, SVG to left circumflex with septal muomectomy and aortic valve replacement with Ce Magna bioprosthetic valve on 01-24-13 by [name] at [facility].   . Heart failure ([place]) 01/22/2013  . History of open heart surgery 01/25/2013  . Hypercholesterolemia 10/15/2014  . Malignant neoplastic disease ([place]) 04/28/2015   Overview:  prostate cancer, s/p prostatectomy    . Prostate cancer ([place])   . Skin cancer     Past Surgical History:  Procedure Laterality Date  . AORTIC VALVE REPLACEMENT    . CARDIAC SURGERY    . CORONARY ARTERY BYPASS GRAFT    . HERNIA REPAIR    . PROSTATECTOMY  2001   Dr. [name]    There were no vitals filed for this visit.  Subjective Assessment -  05/01/19 1310    Subjective  Patient reports doing well today, with no soreness or pain today.  He reports that he feels like his balance has improved 50-60% from the start of PT services.  No falls since last visit. No new questions or concerns at this time.    Pertinent History  Pt reports that he has been struggling with his balance since his wife passed approximately 1 year ago. He states that he has had "1-2 falls" in the last 12 months. Otherwise he denies any other known triggers or recent changes in his health. He has a significant cardiac history of CAD and artificial aortic valve with normal EF on last echo. He is followed by cardiology.    Patient Stated Goals  "Improve my balance"    Currently in Pain?  No/denies         [facility] PT Assessment - 05/01/19 1314      Observation/Other Assessments   Activities of Balance Confidence Scale (ABC Scale)   55.6%      Standardized Balance Assessment   Five times sit to stand comments   14.93      Dynamic Gait Index   Level Surface  Mild Impairment    Change in Gait Speed  Mild Impairment    Gait  with Horizontal Head Turns  Normal    Gait with Vertical Head Turns  Normal    Gait and Pivot Turn  Normal    Step Over Obstacle  Normal    Step Around Obstacles  Normal    Steps  Normal    Total Score  22      Timed Up and Go Test   TUG  Normal TUG    Normal TUG (seconds)  13.34           TREATMENT   Review Goals:   ABC -55.6%  DGI - 22/24  5xSTS - 14.93s  TUG - 13.34s      Therapeutic Exercise      STS from regular chair without UE support 2 x10 with airex pad underneath feet       Standing hip abduction with green tband around BLE 2 x10 performed bilaterally, required rail assist for safety;  CGA provided throughout.  Moderate cueing provided to keep toes facing forward, especially on the L.          Neuromuscular Re-education      Heel to toe rockers on 1/2 foam roll at side of treadmill.  Intermittent SUE  support for steadying.  CGA throughout.  Step ups onto 6" box with airex pad on top 2x10 at side of treadmill.  MinA provided throughout with intermittent modA for steadying              Pt educated throughout session about proper posture and technique with exercises. Improved exercise technique, movement at target joints, use of target muscles after min to mod verbal, visual, tactile cues.            Pt demonstrated good motivation during today's session.  He has met 1 STG and 4 LTGs to date.  He was able to improve his DGI from a 20/24 to a 22/24 today, still displaying a slow self-selected gait speed and having difficulty with increasing his cadence when cued to speed up.  He was able to complete his 5xSTS test today in 14.93s without the use of his UEs, showing improvement from a moth ago.  Additionally, his TUG improved today from 16.25s to 13.34s.  His ABC continues to decline with each assessment, endorsing a 55.6% today, indicating that his confidence in his balance is still poor.  Pt will benefit from PT services to address deficits in strength, balance, and mobility in order to return to full function at home                PT Short Term Goals - 05/01/19 1700      PT SHORT TERM GOAL #1   Title  Pt will be independent with HEP in order to improve strength and balance in order to decrease fall risk and improve function at home.    Time  6    Period  Weeks    Status  Achieved    Target Date  03/26/19        PT Long Term Goals - 05/01/19 1657      PT LONG TERM GOAL #1   Title  Pt will improve BERG by at least 3 points in order to demonstrate clinically significant improvement in balance.    Baseline  02/12/19: 49/56; 03/19/19: 52/56    Time  12    Period  Weeks    Status  Achieved      PT LONG TERM GOAL #2   Title  Pt will improve  DGI by at least 3 points in order to demonstrate clinically significant improvement in balance and decreased risk for falls.    Baseline   02/12/19: 20/24; 03/19/19: 20/24; 05/01/19: 22/24    Time  12    Period  Weeks    Status  Partially Met    Target Date  05/07/19      PT LONG TERM GOAL #3   Title  Pt will decrease 5TSTS by at least 3 seconds in order to demonstrate clinically significant improvement in LE strength.    Baseline  02/12/19: 19.0s; 03/19/19: 17.15sec; 05/01/19: 14.93s    Time  12    Period  Weeks    Status  Achieved      PT LONG TERM GOAL #4   Title  Pt will improve ABC by at least 13% in order to demonstrate clinically significant improvement in balance confidence.    Baseline  02/12/19: 67.5%; 03/19/19: 72.5%; 05/01/19: 55.6%    Time  12    Period  Weeks    Status  Partially Met    Target Date  05/07/19      PT LONG TERM GOAL #5   Title  Pt will decrease TUG to below 14 seconds/decrease in order to demonstrate decreased fall risk.    Baseline  02/12/19: 20.4s; 03/19/19: 16.25s; 05/01/19: 13.34s    Time  12    Period  Weeks    Status  Achieved      PT LONG TERM GOAL #6   Title  Pt will increased 6MWT by at least 62m(1678f in order to demonstrate clinically significant improvement in cardiopulmonary endurance and community ambulation.    Baseline  02/21/19: 62529f8/13/20: 800f51f Time  12    Period  Weeks    Status  Achieved            Plan - 05/01/19 1710    Clinical Impression Statement  Pt demonstrated good motivation during today's session.  He has met 1 STG and 4 LTGs to date.  He was able to improve his DGI from a 20/24 to a 22/24 today, still displaying a slow self-selected gait speed and having difficulty with increasing his cadence when cued to speed up.  He was able to complete his 5xSTS test today in 14.93s without the use of his UEs, showing improvement from a month ago.  Additionally, his TUG improved today from 16.25s to 13.34s.  His ABC continues to decline with each assessment, endorsing a 55.6% today, indicating that his confidence in his balance is still poor.  Pt will benefit from PT  services to address deficits in strength, balance, and mobility in order to return to full function at home    Personal Factors and Comorbidities  Age;Fitness;Comorbidity 1;Comorbidity 2;Time since onset of injury/illness/exacerbation    Comorbidities  Depression, CAD    Examination-Activity Limitations  Caring for Others;Squat;Stairs;Stand    Examination-Participation Restrictions  Cleaning;Community Activity;Yard Work;Shop    Stability/Clinical Decision Making  Evolving/Moderate complexity    Clinical Decision Making  Moderate    Rehab Potential  Good    PT Frequency  2x / week    PT Duration  12 weeks    PT Treatment/Interventions  ADLs/Self Care Home Management;Aquatic Therapy;Biofeedback;Canalith Repostioning;Cryotherapy;Electrical Stimulation;Iontophoresis 4mg/46mDexamethasone;Moist Heat;Traction;Ultrasound;DME Instruction;Gait training;Stair training;Functional mobility training;Therapeutic activities;Therapeutic exercise;Balance training;Neuromuscular re-education;Cognitive remediation;Patient/family education;Manual techniques;Vestibular    PT Next Visit Plan  increase step length and cadence; progress strengthening and balance    PT Home Exercise Plan  Medbridge Access Code: [email or web address] (Semitandem balance and sit to stand repeats)    Consulted and Agree with Plan of Care  Patient       Patient will benefit from skilled therapeutic intervention in order to improve the following deficits and impairments:  Abnormal gait, Cardiopulmonary status limiting activity, Decreased activity tolerance, Decreased balance, Decreased endurance, Decreased strength, Difficulty walking  Visit Diagnosis: Unsteadiness on feet  Muscle weakness (generalized)  History of falling     Problem List Patient Active Problem List   Diagnosis Date Noted  . Malignant neoplastic disease ([place]) 04/28/2015  . Hypercholesterolemia 10/15/2014  . Essential (primary) hypertension 06/02/2014  . Acute blood loss  anemia 01/25/2013  . Bloody pleural effusion 01/25/2013  . Pain following surgery or procedure 01/25/2013  . H/O aortic valve replacement 01/25/2013  . H/O coronary artery bypass surgery 01/25/2013  . History of open heart surgery 01/25/2013  . Angina, class II ([place]) 01/22/2013  . Heart failure ([place]) 01/22/2013  . 3-vessel CAD 01/17/2013  . Aortic heart valve narrowing 01/17/2013     This entire session was performed under direct supervision and direction of a licensed therapist/therapist assistant . I have personally read, edited and approve of the note as written.   Lutricia Horsfall, SPT [facility] PT, DPT, GCS  Huprich,Jason 05/02/2019, 9:56 AM  [place] MAIN [facility] SERVICES [address] [place], [place], [address] Phone: [phone]   Fax:  [phone]  Name: Bradley Nelson MRN: [id number] Date of Birth: [birth year]

## 2019-05-06 ENCOUNTER — Other Ambulatory Visit: Payer: Self-pay

## 2019-05-06 ENCOUNTER — Ambulatory Visit: Payer: PPO

## 2019-05-06 DIAGNOSIS — R2681 Unsteadiness on feet: Secondary | ICD-10-CM

## 2019-05-06 DIAGNOSIS — M6281 Muscle weakness (generalized): Secondary | ICD-10-CM

## 2019-05-06 NOTE — Therapy (Addendum)
[place] MAIN [facility] SERVICES [address] [place], [place], [address] Phone: [phone]   Fax:  [phone]  Physical Therapy Treatment/Recertification  Patient Details  Name: Bradley Nelson MRN: [id number] Date of Birth: [birth year] Referring Provider (PT): Dr Velna Ochs   Encounter Date: 05/06/2019  PT End of Session - 05/06/19 0913    Visit Number  21    Number of Visits  33    Date for PT Re-Evaluation  06/03/19    Authorization Type  eval 02/12/19, goals updated 05/01/19    PT Start Time  0945    PT Stop Time  1030    PT Time Calculation (min)  45 min    Equipment Utilized During Treatment  Gait belt    Activity Tolerance  Patient tolerated treatment well;Other (comment)   BP   Behavior During Therapy  WFL for tasks assessed/performed       Past Medical History:  Diagnosis Date  . Acute blood loss anemia 01/25/2013  . Angina, class II ([place]) 01/22/2013  . Aortic heart valve narrowing 01/17/2013  . Bloody pleural effusion 01/25/2013  . Essential (primary) hypertension 06/02/2014  . H/O aortic valve replacement 01/25/2013  . H/O coronary artery bypass surgery 01/25/2013   Overview:  cabg x3 with LIMA to LAD, SVG to PDA, SVG to left circumflex with septal muomectomy and aortic valve replacement with Ce Magna bioprosthetic valve on 01-24-13 by [name] at [facility].   . Heart failure ([place]) 01/22/2013  . History of open heart surgery 01/25/2013  . Hypercholesterolemia 10/15/2014  . Malignant neoplastic disease ([place]) 04/28/2015   Overview:  prostate cancer, s/p prostatectomy    . Prostate cancer ([place])   . Skin cancer     Past Surgical History:  Procedure Laterality Date  . AORTIC VALVE REPLACEMENT    . CARDIAC SURGERY    . CORONARY ARTERY BYPASS GRAFT    . HERNIA REPAIR    . PROSTATECTOMY  2001   Dr. [name]    There were no vitals filed for this visit.  Subjective Assessment - 05/06/19 0946    Subjective  Patient reports doing  well today, with no soreness or pain today.  He reports that he feels that he is making improvements.  No falls since last visit. No new questions or concerns at this time.    Pertinent History  Pt reports that he has been struggling with his balance since his wife passed approximately 1 year ago. He states that he has had "1-2 falls" in the last 12 months. Otherwise he denies any other known triggers or recent changes in his health. He has a significant cardiac history of CAD and artificial aortic valve with normal EF on last echo. He is followed by cardiology.    Patient Stated Goals  "Improve my balance"    Currently in Pain?  No/denies      Treatment   Therapeutic Exercise        6MWT - 783 ft   STS from regular chair without UE support 3 x10 with airex pad underneath feet         Standing hip abduction with green tband around BLE 2 x10 performed bilaterally, required rail assist for safety;  CGA provided throughout.  Moderate cueing provided to keep toes facing forward, especially on the L.       Standing hip extension with green tband around BLE 2x10 performed bilaterally   HEP reviewed and updated: Standing Romberg to  1/2 Tandem Stance - 3x30s  Sit to Stand without Arm Support 2x10; discussed adding a pillow under his feet or adding reps  Standing Hip Abduction with green band and Counter Support 2x10 reps  Standing Hip Extension with green band and Unilateral Counter Support - 2x 10 reps  Tandem Walking with Counter Support - 2x10 lengths of elevated mat table     Backward Tandem Walking with Counter Support 2x10 lengths of elevated mat table Standing March with Counter Support 2x10      Pt educated throughout session about proper posture and technique with exercises. Improved exercise technique, movement at target joints, use of target muscles after min to mod verbal, visual, tactile cues.    Pt demonstrated good motivation during today's session.  He has met 1 STG and 3 LTGs  to date.  His 6MWT was 73f today, demonstrating improvement from his baseline of 6235f but still falls below community ambulation distances.  He was able to improve his DGI from a 20/24 to a 22/24, still displaying a slow self-selected gait speed and having difficulty with increasing his cadence when cued to speed up.  He was able to complete his 5xSTS test in 14.93s without the use of his UEs, showing improvement from baseline..  Additionally, his TUG improved from 16.25s to 13.34s.  His ABC continues to decline with each assessment, endorsing a 55.6% at last assessment, indicating that his confidence in his balance is still poor.  Pt will benefit from PT services to address deficits in strength, balance, and mobility in order to return to full function at home.     [facility]47      6 Minute Walk- Baseline   6 Minute Walk- Baseline  yes    BP (mmHg)  129/71    HR (bpm)  52    02 Sat (%RA)  100 %    Modified Borg Scale for Dyspnea  0- Nothing at all    Perceived Rate of Exertion (Borg)  6-      6 Minute walk- Post Test   6 Minute Walk Post Test  yes    BP (mmHg)  151/77    HR (bpm)  61    02 Sat (%RA)  100 %    Modified Borg Scale for Dyspnea  5- Strong or hard breathing    Perceived Rate of Exertion (Borg)  13- Somewhat hard      6 minute walk test results    Aerobic Endurance Distance Walked  783                PT Short Term Goals - 05/01/19 1700      PT SHORT TERM GOAL #1   Title  Pt will be independent with HEP in order to improve strength and balance in order to decrease fall risk and improve function at home.    Time  6    Period  Weeks    Status  Achieved    Target Date  03/26/19        PT Long Term Goals - 05/06/19 1210      PT LONG TERM GOAL #1   Title  Pt will improve BERG by at least 3 points in order to demonstrate clinically significant improvement in balance.    Baseline  02/12/19: 49/56; 03/19/19: 52/56    Time  12    Period   Weeks    Status  Achieved      PT LONG  TERM GOAL #2   Title  Pt will improve DGI by at least 3 points in order to demonstrate clinically significant improvement in balance and decreased risk for falls.    Baseline  02/12/19: 20/24; 03/19/19: 20/24; 05/01/19: 22/24    Time  12    Period  Weeks    Status  Partially Met    Target Date  06/03/19      PT LONG TERM GOAL #3   Title  Pt will decrease 5TSTS by at least 3 seconds in order to demonstrate clinically significant improvement in LE strength.    Baseline  02/12/19: 19.0s; 03/19/19: 17.15sec; 05/01/19: 14.93s    Time  12    Period  Weeks    Status  Achieved      PT LONG TERM GOAL #4   Title  Pt will improve ABC by at least 13% in order to demonstrate clinically significant improvement in balance confidence.    Baseline  02/12/19: 67.5%; 03/19/19: 72.5%; 05/01/19: 55.6%    Time  12    Period  Weeks    Status  Partially Met    Target Date  06/03/19      PT LONG TERM GOAL #5   Title  Pt will decrease TUG to below 14 seconds/decrease in order to demonstrate decreased fall risk.    Baseline  02/12/19: 20.4s; 03/19/19: 16.25s; 05/01/19: 13.34s    Time  12    Period  Weeks    Status  Achieved      PT LONG TERM GOAL #6   Title  Pt will increased 6MWT to >1024f  in order to demonstrate clinically significant improvement in cardiopulmonary endurance and community ambulation.    Baseline  02/21/19: 6217f 03/21/19: 80036f9/28/20: 783f42f Time  12    Period  Weeks    Status  Revised    Target Date  06/03/19            Plan - 05/06/19 1219    Clinical Impression Statement  Pt demonstrated good motivation during today's session.  He has met 1 STG and 3 LTGs to date.  His 6MWT was 783ft30fay, demonstrating improvement from his baseline of 625ft,65f still falls below community ambulation distances.  He was able to improve his DGI from a 20/24 to a 22/24, still displaying a slow self-selected gait speed and having difficulty with increasing his  cadence when cued to speed up.  He was able to complete his 5xSTS test in 14.93s without the use of his UEs, showing improvement from baseline..  Additionally, his TUG improved from 16.25s to 13.34s.  His ABC continues to decline with each assessment, endorsing a 55.6% at last assessment, indicating that his confidence in his balance is still poor.  Pt will benefit from PT services to address deficits in strength, balance, and mobility in order to return to full function at home.    Personal Factors and Comorbidities  Age;Fitness;Comorbidity 1;Comorbidity 2;Time since onset of injury/illness/exacerbation    Comorbidities  Depression, CAD    Examination-Activity Limitations  Caring for Others;Squat;Stairs;Stand    Examination-Participation Restrictions  Cleaning;Community Activity;Yard Work;Shop    Stability/Clinical Decision Making  Evolving/Moderate complexity    Rehab Potential  Good    PT Frequency  2x / week    PT Duration  4 weeks    PT Treatment/Interventions  ADLs/Self Care Home Management;Aquatic Therapy;Biofeedback;Canalith Repostioning;Cryotherapy;Electrical Stimulation;Iontophoresis 4mg/ml23mxamethasone;Moist Heat;Traction;Ultrasound;DME Instruction;Gait training;Stair training;Functional mobility training;Therapeutic activities;Therapeutic exercise;Balance training;Neuromuscular re-education;Cognitive remediation;Patient/family education;Manual techniques;Vestibular  PT Next Visit Plan  increase step length and cadence; progress strengthening and balance; review HEP    PT Home Exercise Plan  Medbridge Access Code: [email or web address] (Semitandem balance and sit to stand repeats)    Recommended Other Services  [email or web address]    Consulted and Agree with Plan of Care  Patient       Patient will benefit from skilled therapeutic intervention in order to improve the following deficits and impairments:  Abnormal gait, Cardiopulmonary status limiting activity, Decreased activity tolerance, Decreased balance,  Decreased endurance, Decreased strength, Difficulty walking  Visit Diagnosis: Unsteadiness on feet - Plan: PT plan of care cert/re-cert  Muscle weakness (generalized) - Plan: PT plan of care cert/re-cert     Problem List Patient Active Problem List   Diagnosis Date Noted  . Malignant neoplastic disease ([place]) 04/28/2015  . Hypercholesterolemia 10/15/2014  . Essential (primary) hypertension 06/02/2014  . Acute blood loss anemia 01/25/2013  . Bloody pleural effusion 01/25/2013  . Pain following surgery or procedure 01/25/2013  . H/O aortic valve replacement 01/25/2013  . H/O coronary artery bypass surgery 01/25/2013  . History of open heart surgery 01/25/2013  . Angina, class II ([place]) 01/22/2013  . Heart failure ([place]) 01/22/2013  . 3-vessel CAD 01/17/2013  . Aortic heart valve narrowing 01/17/2013     This entire session was performed under direct supervision and direction of a licensed therapist/therapist assistant . I have personally read, edited and approve of the note as written.   Bradley Nelson, SPT [facility] PT, DPT, GCS  Bradley Nelson,Bradley Nelson 05/06/2019, 5:12 PM  [place] MAIN [facility] SERVICES [address] [place] [address] [place], [place], [address] Phone: [phone]   Fax:  [phone]  Name: Bradley Nelson MRN: [id number] Date of Birth: 1932/03/13

## 2019-05-06 NOTE — Patient Instructions (Signed)
Access Code: [email or web address]  URL: [email or web address]  Date: 05/06/2019  Prepared by: Roxana Hires   Exercises Standing Romberg to 1/2 Tandem Stance - 3 reps - 30s x 3 with each leg forward hold - 2x daily - 7x weekly Sit to Stand without Arm Support - 10 reps - 2 sets - 1x daily - 7x weekly Standing Hip Abduction with Resistance at Ankles and Counter Support - 10 reps - 2 sets - 1x daily - 7x weekly Standing Hip Extension with Resistance at Ankles and Unilateral Counter Support - 10 reps - 2 sets - 1x daily - 7x weekly Tandem Walking with Counter Support - 10 reps - 2 sets - 1x daily                            - 7x weekly Backward Tandem Walking with Counter Support - 10 reps - 2 sets - 1x daily - 7x weekly Standing March with Counter Support - 10 reps - 2 sets - 1x daily - 7x weekly

## 2019-05-08 ENCOUNTER — Other Ambulatory Visit: Payer: Self-pay

## 2019-05-08 ENCOUNTER — Ambulatory Visit: Payer: PPO

## 2019-05-08 DIAGNOSIS — M6281 Muscle weakness (generalized): Secondary | ICD-10-CM

## 2019-05-08 DIAGNOSIS — R2681 Unsteadiness on feet: Secondary | ICD-10-CM | POA: Diagnosis not present

## 2019-05-08 NOTE — Therapy (Addendum)
[place] MAIN [facility] SERVICES [address] [place], [place], [address] Phone: [phone]   Fax:  [phone]   Physical Therapy Treatment  Patient Details  Name: Bradley Nelson MRN: [id number] Date of Birth: 01-24-1932 Referring Provider (PT): Dr Velna Ochs   Encounter Date: 05/08/2019  PT End of Session - 05/08/19 1348    Visit Number  22    Number of Visits  33    Date for PT Re-Evaluation  06/03/19    Authorization Type  eval 02/12/19, goals updated 05/01/19    PT Start Time  1345    PT Stop Time  1430    PT Time Calculation (min)  45 min    Equipment Utilized During Treatment  Gait belt    Activity Tolerance  Patient tolerated treatment well;Other (comment)   BP   Behavior During Therapy  WFL for tasks assessed/performed       Past Medical History:  Diagnosis Date  . Acute blood loss anemia 01/25/2013  . Angina, class II ([place]) 01/22/2013  . Aortic heart valve narrowing 01/17/2013  . Bloody pleural effusion 01/25/2013  . Essential (primary) hypertension 06/02/2014  . H/O aortic valve replacement 01/25/2013  . H/O coronary artery bypass surgery 01/25/2013   Overview:  cabg x3 with LIMA to LAD, SVG to PDA, SVG to left circumflex with septal muomectomy and aortic valve replacement with Ce Magna bioprosthetic valve on 01-24-13 by [name] at [facility].   . Heart failure ([place]) 01/22/2013  . History of open heart surgery 01/25/2013  . Hypercholesterolemia 10/15/2014  . Malignant neoplastic disease ([place]) 04/28/2015   Overview:  prostate cancer, s/p prostatectomy    . Prostate cancer ([place])   . Skin cancer     Past Surgical History:  Procedure Laterality Date  . AORTIC VALVE REPLACEMENT    . CARDIAC SURGERY    . CORONARY ARTERY BYPASS GRAFT    . HERNIA REPAIR    . PROSTATECTOMY  2001   Dr. Eliberto Nelson    There were no vitals filed for this visit.  Subjective Assessment - 05/08/19 1343    Subjective  Patient reports doing well today, with  no soreness or pain today. No falls since last visit and no recent changes in health or medication. No new questions or concerns at this time.    Pertinent History  Pt reports that he has been struggling with his balance since his wife passed approximately 1 year ago. He states that he has had "1-2 falls" in the last 12 months. Otherwise he denies any other known triggers or recent changes in his health. He has a significant cardiac history of CAD and artificial aortic valve with normal EF on last echo. He is followed by cardiology.    Patient Stated Goals  "Improve my balance"    Currently in Pain?  No/denies          TREATMENT   Ther-ex  Warm up on XRide, level 3-4 x 5 min during history (4 minutes unbilled);   Quantum seated leg press 105# x 20, 120 # x 20, pt reports only minimal fatigue at end of second set; STS from regular chair without UE support 2 x10 with airex pad underneath feet and alternating horizontal head turns;    Standing resisted hip abduction with red tband around BLE x 10 bilaterally, BUE support and CGA. Moderate cueing provided to keep toes facing forward and to focus on eccentric control of hip abductors; Red tband  resisted side-stepping with BUE support 2 x 5 each direction;     Neuromuscular Re-education  6" step taps while standing on airex pad alternating LE 2 x 10 on each leg; 6" lateral step-ups without UE support, occasional cues for posture and to keep toes facing forward x 10 each direction; 1/2 foam roll (flat side up) balance with intermittent 2 finger support x 60s; Braiding steps without UE support 2 x 5' each direction, extensive verbal and tactile cues for technique;         Pt educated throughout session about proper posture and technique with exercises. Improved exercise technique, movement at target joints, use of target muscles after min to mod verbal, visual, tactile cues.      Pt demonstrated excellent motivation during today's  session.  He struggles with mild imbalance during sit to stand with head turns while standing on the Airex today. He is able to perform the seated leg press today but still not fatigued by the end of the second set so will continue to progress resistance at future sessions. He also struggles with braiding steps today but improves with verbal and tactile cues. Pt will benefit from PT services to address deficits in strength, balance, and mobility in order to return to full function at home                        PT Short Term Goals - 05/01/19 1700      PT SHORT TERM GOAL #1   Title  Pt will be independent with HEP in order to improve strength and balance in order to decrease fall risk and improve function at home.    Time  6    Period  Weeks    Status  Achieved    Target Date  03/26/19        PT Long Term Goals - 05/06/19 1210      PT LONG TERM GOAL #1   Title  Pt will improve BERG by at least 3 points in order to demonstrate clinically significant improvement in balance.    Baseline  02/12/19: 49/56; 03/19/19: 52/56    Time  12    Period  Weeks    Status  Achieved      PT LONG TERM GOAL #2   Title  Pt will improve DGI by at least 3 points in order to demonstrate clinically significant improvement in balance and decreased risk for falls.    Baseline  02/12/19: 20/24; 03/19/19: 20/24; 05/01/19: 22/24    Time  12    Period  Weeks    Status  Partially Met    Target Date  06/03/19      PT LONG TERM GOAL #3   Title  Pt will decrease 5TSTS by at least 3 seconds in order to demonstrate clinically significant improvement in LE strength.    Baseline  02/12/19: 19.0s; 03/19/19: 17.15sec; 05/01/19: 14.93s    Time  12    Period  Weeks    Status  Achieved      PT LONG TERM GOAL #4   Title  Pt will improve ABC by at least 13% in order to demonstrate clinically significant improvement in balance confidence.    Baseline  02/12/19: 67.5%; 03/19/19: 72.5%; 05/01/19: 55.6%    Time  12     Period  Weeks    Status  Partially Met    Target Date  06/03/19      PT LONG  TERM GOAL #5   Title  Pt will decrease TUG to below 14 seconds/decrease in order to demonstrate decreased fall risk.    Baseline  02/12/19: 20.4s; 03/19/19: 16.25s; 05/01/19: 13.34s    Time  12    Period  Weeks    Status  Achieved      PT LONG TERM GOAL #6   Title  Pt will increased 6MWT to >1070f  in order to demonstrate clinically significant improvement in cardiopulmonary endurance and community ambulation.    Baseline  02/21/19: 6256f 03/21/19: 80067f9/28/20: 783f46f Time  12    Period  Weeks    Status  Revised    Target Date  06/03/19            Plan - 05/08/19 1348    Clinical Impression Statement  Pt demonstrated excellent motivation during today's session.  He struggles with mild imbalance during sit to stand with head turns while standing on the Airex today. He is able to perform the seated leg press today but still not fatigued by the end of the second set so will continue to progress resistance at future sessions. He also struggles with braiding steps today but improves with verbal and tactile cues. Pt will benefit from PT services to address deficits in strength, balance, and mobility in order to return to full function at home    Personal Factors and Comorbidities  Age;Fitness;Comorbidity 1;Comorbidity 2;Time since onset of injury/illness/exacerbation    Comorbidities  Depression, CAD    Examination-Activity Limitations  Caring for Others;Squat;Stairs;Stand    Examination-Participation Restrictions  Cleaning;Community Activity;Yard Work;Shop    Stability/Clinical Decision Making  Evolving/Moderate complexity    Rehab Potential  Good    PT Frequency  2x / week    PT Duration  4 weeks    PT Treatment/Interventions  ADLs/Self Care Home Management;Aquatic Therapy;Biofeedback;Canalith Repostioning;Cryotherapy;Electrical Stimulation;Iontophoresis 4mg/104mDexamethasone;Moist  Heat;Traction;Ultrasound;DME Instruction;Gait training;Stair training;Functional mobility training;Therapeutic activities;Therapeutic exercise;Balance training;Neuromuscular re-education;Cognitive remediation;Patient/family education;Manual techniques;Vestibular    PT Next Visit Plan  increase step length and cadence; progress strengthening and balance; review HEP    PT Home Exercise Plan  Medbridge Access Code: [email or web address] balance and sit to stand repeats)    Consulted and Agree with Plan of Care  Patient       Patient will benefit from skilled therapeutic intervention in order to improve the following deficits and impairments:  Abnormal gait, Cardiopulmonary status limiting activity, Decreased activity tolerance, Decreased balance, Decreased endurance, Decreased strength, Difficulty walking  Visit Diagnosis: Unsteadiness on feet  Muscle weakness (generalized)     Problem List Patient Active Problem List   Diagnosis Date Noted  . Malignant neoplastic disease (HCC) [place]20/2016  . Hypercholesterolemia 10/15/2014  . Essential (primary) hypertension 06/02/2014  . Acute blood loss anemia 01/25/2013  . Bloody pleural effusion 01/25/2013  . Pain following surgery or procedure 01/25/2013  . H/O aortic valve replacement 01/25/2013  . H/O coronary artery bypass surgery 01/25/2013  . History of open heart surgery 01/25/2013  . Angina, class II (HCC) [place]17/2014  . Heart failure (HCC) [place]17/2014  . 3-vessel CAD 01/17/2013  . Aortic heart valve narrowing 01/17/2013   Bradley Nelson, Bradley Nelson  Bradley Nelson 05/09/2019, 9:22 AM  [facility] [id number]: [phone]:  [phone]: MelviJAYSIAH Nelson [phone] of Birth: 5/3/1February 05, 1933

## 2019-05-13 ENCOUNTER — Ambulatory Visit: Payer: PPO | Attending: Internal Medicine

## 2019-05-13 ENCOUNTER — Other Ambulatory Visit: Payer: Self-pay

## 2019-05-13 DIAGNOSIS — M6281 Muscle weakness (generalized): Secondary | ICD-10-CM | POA: Diagnosis not present

## 2019-05-13 DIAGNOSIS — R278 Other lack of coordination: Secondary | ICD-10-CM | POA: Diagnosis not present

## 2019-05-13 DIAGNOSIS — Z9181 History of falling: Secondary | ICD-10-CM | POA: Diagnosis not present

## 2019-05-13 DIAGNOSIS — R2681 Unsteadiness on feet: Secondary | ICD-10-CM | POA: Diagnosis not present

## 2019-05-13 DIAGNOSIS — R29898 Other symptoms and signs involving the musculoskeletal system: Secondary | ICD-10-CM | POA: Diagnosis not present

## 2019-05-13 NOTE — Therapy (Signed)
[place] MAIN [facility] SERVICES [address] [place], [place], [address] Phone: [phone]   Fax:  [phone]  Physical Therapy Treatment  Patient Details  Name: Bradley Nelson MRN: [id number] Date of Birth: 09/03/1931 Referring Provider (PT): Dr Velna Ochs   Encounter Date: 05/13/2019  PT End of Session - 05/13/19 1439    Visit Number  23    Number of Visits  33    Date for PT Re-Evaluation  06/03/19    Authorization Type  eval 02/12/19, goals updated 05/01/19    PT Start Time  1430    PT Stop Time  1515    PT Time Calculation (min)  45 min    Equipment Utilized During Treatment  Gait belt    Activity Tolerance  Patient tolerated treatment well    Behavior During Therapy  [facility] for tasks assessed/performed       Past Medical History:  Diagnosis Date  . Acute blood loss anemia 01/25/2013  . Angina, class II ([place]) 01/22/2013  . Aortic heart valve narrowing 01/17/2013  . Bloody pleural effusion 01/25/2013  . Essential (primary) hypertension 06/02/2014  . H/O aortic valve replacement 01/25/2013  . H/O coronary artery bypass surgery 01/25/2013   Overview:  cabg x3 with LIMA to LAD, SVG to PDA, SVG to left circumflex with septal muomectomy and aortic valve replacement with Ce Magna bioprosthetic valve on 01-24-13 by [name] at [facility].   . Heart failure ([place]) 01/22/2013  . History of open heart surgery 01/25/2013  . Hypercholesterolemia 10/15/2014  . Malignant neoplastic disease ([place]) 04/28/2015   Overview:  prostate cancer, s/p prostatectomy    . Prostate cancer ([place])   . Skin cancer     Past Surgical History:  Procedure Laterality Date  . AORTIC VALVE REPLACEMENT    . CARDIAC SURGERY    . CORONARY ARTERY BYPASS GRAFT    . HERNIA REPAIR    . PROSTATECTOMY  2001   Dr. [name]    There were no vitals filed for this visit.  Subjective Assessment - 05/13/19 1439    Subjective  Patient reports doing well today, with no soreness or pain  today. No falls since last visit and no recent changes in health or medication. No new questions or concerns at this time.    Pertinent History  Pt reports that he has been struggling with his balance since his wife passed approximately 1 year ago. He states that he has had "1-2 falls" in the last 12 months. Otherwise he denies any other known triggers or recent changes in his health. He has a significant cardiac history of CAD and artificial aortic valve with normal EF on last echo. He is followed by cardiology.    Patient Stated Goals  "Improve my balance"           TREATMENT   Ther-ex  Warm up onXRide, level3-4x 19mn during history (4 minutes unbilled);  Quantum seated leg press 120 # x 20, 135# x 20, 150# x 20, pt reports that he could have continued with some additional reps at the end of the final set; STS from regular chair without UE support x 10 with airex pad underneath feet and alternating horizontal head turns; Step-ups to BOSU ball (round side up) with faded UE support (SUE to no UE support) x 5 with each leg;   Neuromuscular Re-education  Rockerboard A/P orientation static balance x 30s, horizontal head turns x 30s, vertical head turns x 30s,  3.5# bar lifts x 30s; Rockerboard R/L orientation static balance x 30s, horizontal head turns x 30s, vertical head turns x 30s, 3.5# bar lifts x 30s;  Tandem gait on Airex balance beam forward/backwards x 2 lengths each; Side stepping on Airex balance beam x 4 lengths; Lateral stepping over single point cane on Airex balance beam both directions x multiple bouts each;   Pt educated throughout session about proper posture and technique with exercises. Improved exercise technique, movement at target joints, use of target muscles after min to mod verbal, visual, tactile cues.   Pt demonstrated excellent motivation during today's session. He is able to increase his resistance on the leg press today and demonstrates  improved stability with sit to stand exercises. He struggles with forward and lateral stepping on the Airex balance beam.Pt will benefit from PT services to address deficits in strength, balance, and mobility in order to return to full function at home.                                 PT Short Term Goals - 05/01/19 1700      PT SHORT TERM GOAL #1   Title  Pt will be independent with HEP in order to improve strength and balance in order to decrease fall risk and improve function at home.    Time  6    Period  Weeks    Status  Achieved    Target Date  03/26/19        PT Long Term Goals - 05/06/19 1210      PT LONG TERM GOAL #1   Title  Pt will improve BERG by at least 3 points in order to demonstrate clinically significant improvement in balance.    Baseline  02/12/19: 49/56; 03/19/19: 52/56    Time  12    Period  Weeks    Status  Achieved      PT LONG TERM GOAL #2   Title  Pt will improve DGI by at least 3 points in order to demonstrate clinically significant improvement in balance and decreased risk for falls.    Baseline  02/12/19: 20/24; 03/19/19: 20/24; 05/01/19: 22/24    Time  12    Period  Weeks    Status  Partially Met    Target Date  06/03/19      PT LONG TERM GOAL #3   Title  Pt will decrease 5TSTS by at least 3 seconds in order to demonstrate clinically significant improvement in LE strength.    Baseline  02/12/19: 19.0s; 03/19/19: 17.15sec; 05/01/19: 14.93s    Time  12    Period  Weeks    Status  Achieved      PT LONG TERM GOAL #4   Title  Pt will improve ABC by at least 13% in order to demonstrate clinically significant improvement in balance confidence.    Baseline  02/12/19: 67.5%; 03/19/19: 72.5%; 05/01/19: 55.6%    Time  12    Period  Weeks    Status  Partially Met    Target Date  06/03/19      PT LONG TERM GOAL #5   Title  Pt will decrease TUG to below 14 seconds/decrease in order to demonstrate decreased fall risk.    Baseline  02/12/19:  20.4s; 03/19/19: 16.25s; 05/01/19: 13.34s    Time  12    Period  Weeks    Status  Achieved  PT LONG TERM GOAL #6   Title  Pt will increased 6MWT to >1059f  in order to demonstrate clinically significant improvement in cardiopulmonary endurance and community ambulation.    Baseline  02/21/19: 6248f 03/21/19: 80060f9/28/20: 783f13f Time  12    Period  Weeks    Status  Revised    Target Date  06/03/19            Plan - 05/13/19 1440    Clinical Impression Statement  Pt demonstrated excellent motivation during today's session. He is able to increase his resistance on the leg press today and demonstrates improved stability with sit to stand exercises. He struggles with forward and lateral stepping on the Airex balance beam. Pt will benefit from PT services to address deficits in strength, balance, and mobility in order to return to full function at home.    Personal Factors and Comorbidities  Age;Fitness;Comorbidity 1;Comorbidity 2;Time since onset of injury/illness/exacerbation    Comorbidities  Depression, CAD    Examination-Activity Limitations  Caring for Others;Squat;Stairs;Stand    Examination-Participation Restrictions  Cleaning;Community Activity;Yard Work;Shop    Stability/Clinical Decision Making  Evolving/Moderate complexity    Rehab Potential  Good    PT Frequency  2x / week    PT Duration  4 weeks    PT Treatment/Interventions  ADLs/Self Care Home Management;Aquatic Therapy;Biofeedback;Canalith Repostioning;Cryotherapy;Electrical Stimulation;Iontophoresis 4mg/36mDexamethasone;Moist Heat;Traction;Ultrasound;DME Instruction;Gait training;Stair training;Functional mobility training;Therapeutic activities;Therapeutic exercise;Balance training;Neuromuscular re-education;Cognitive remediation;Patient/family education;Manual techniques;Vestibular    PT Next Visit Plan  increase step length and cadence; progress strengthening and balance; review HEP    PT Home Exercise Plan   Medbridge Access Code: [email or web address] balance and sit to stand repeats)    Consulted and Agree with Plan of Care  Patient       Patient will benefit from skilled therapeutic intervention in order to improve the following deficits and impairments:  Abnormal gait, Cardiopulmonary status limiting activity, Decreased activity tolerance, Decreased balance, Decreased endurance, Decreased strength, Difficulty walking  Visit Diagnosis: Unsteadiness on feet  Muscle weakness (generalized)     Problem List Patient Active Problem List   Diagnosis Date Noted  . Malignant neoplastic disease (HCC) [place]20/2016  . Hypercholesterolemia 10/15/2014  . Essential (primary) hypertension 06/02/2014  . Acute blood loss anemia 01/25/2013  . Bloody pleural effusion 01/25/2013  . Pain following surgery or procedure 01/25/2013  . H/O aortic valve replacement 01/25/2013  . H/O coronary artery bypass surgery 01/25/2013  . History of open heart surgery 01/25/2013  . Angina, class II (HCC) [place]17/2014  . Heart failure (HCC) [place]17/2014  . 3-vessel CAD 01/17/2013  . Aortic heart valve narrowing 01/17/2013   Phillips GroutDPT, GCS  , 05/13/2019, 3:20 PM  [facility] CenterICES [id number]: [phone]:  [phone]: Bradley Nelson [phone] of Birth: 5/3/108/22/1933

## 2019-05-15 ENCOUNTER — Other Ambulatory Visit: Payer: Self-pay

## 2019-05-15 ENCOUNTER — Ambulatory Visit: Payer: PPO

## 2019-05-15 DIAGNOSIS — R2681 Unsteadiness on feet: Secondary | ICD-10-CM | POA: Diagnosis not present

## 2019-05-15 DIAGNOSIS — M6281 Muscle weakness (generalized): Secondary | ICD-10-CM

## 2019-05-15 NOTE — Therapy (Signed)
[place] MAIN [facility] SERVICES [address] [place], [place], [address] Phone: [phone]   Fax:  [phone]  Physical Therapy Treatment  Patient Details  Name: Bradley Nelson MRN: [id number] Date of Birth: [birth year] Referring Provider (PT): Dr Velna Ochs   Encounter Date: 05/15/2019  PT End of Session - 05/15/19 1444    Visit Number  24    Number of Visits  33    Date for PT Re-Evaluation  06/03/19    Authorization Type  eval 02/12/19, goals updated 05/01/19    PT Start Time  1430    PT Stop Time  1515    PT Time Calculation (min)  45 min    Equipment Utilized During Treatment  Gait belt    Activity Tolerance  Patient tolerated treatment well    Behavior During Therapy  [facility] for tasks assessed/performed       Past Medical History:  Diagnosis Date  . Acute blood loss anemia 01/25/2013  . Angina, class II ([place]) 01/22/2013  . Aortic heart valve narrowing 01/17/2013  . Bloody pleural effusion 01/25/2013  . Essential (primary) hypertension 06/02/2014  . H/O aortic valve replacement 01/25/2013  . H/O coronary artery bypass surgery 01/25/2013   Overview:  cabg x3 with LIMA to LAD, SVG to PDA, SVG to left circumflex with septal muomectomy and aortic valve replacement with Ce Magna bioprosthetic valve on 01-24-13 by [name] at [facility].   . Heart failure ([place]) 01/22/2013  . History of open heart surgery 01/25/2013  . Hypercholesterolemia 10/15/2014  . Malignant neoplastic disease ([place]) 04/28/2015   Overview:  prostate cancer, s/p prostatectomy    . Prostate cancer ([place])   . Skin cancer     Past Surgical History:  Procedure Laterality Date  . AORTIC VALVE REPLACEMENT    . CARDIAC SURGERY    . CORONARY ARTERY BYPASS GRAFT    . HERNIA REPAIR    . PROSTATECTOMY  2001   Dr. [name]    There were no vitals filed for this visit.  Subjective Assessment - 05/15/19 1444    Subjective  Patient reports doing well today, with no soreness or pain  today. No falls since last visit and no recent changes in health or medication. States that he bailed some hay yesterday and had some neck soreness afterward but none today. No new questions or concerns at this time.    Pertinent History  Pt reports that he has been struggling with his balance since his wife passed approximately 1 year ago. He states that he has had "1-2 falls" in the last 12 months. Otherwise he denies any other known triggers or recent changes in his health. He has a significant cardiac history of CAD and artificial aortic valve with normal EF on last echo. He is followed by cardiology.    Patient Stated Goals  "Improve my balance"    Currently in Pain?  No/denies         TREATMENT   Ther-ex Warm up onXRide, level3-4x 70mnduring history (4 minutes unbilled);  Quantum seated leg press 165# x 20, 180# x 20 pt is fatigued at the end of each set; 6" forward step-ups alternating LE without UE support x 10 each; 6" lateral step-ups alternating LE without UE support x 10 each; STS from regular chair without UE support with airex pad underneath feet2 x 10;   Neuromuscular Re-education 6" alternating step taps without UE support x 10 each; 4 square stepping clockwise/counterclockwise x  4 each direction; 1/2 foam roller (flat side up) static balance x 60s; 1/2 foam roller (flat side up) heel/toe weight shifting x 60s; 1/2 foam roller (flat side up) tandem gait alternating forward LE x 30s each;   Pt educated throughout session about proper posture and technique with exercises. Improved exercise technique, movement at target joints, use of target muscles after min to mod verbal, visual, tactile cues.    Pt demonstrated excellent motivation during today's session. He is able to increase his resistance on the leg press again today and achieves appropriate fatigue by the end of each set. He struggles with balance on uneven surfaces as well as backwards  stepping with 4 square pattern.Pt will benefit from PT services to address deficits in strength, balance, and mobility in order to return to full function at home.                        PT Short Term Goals - 05/01/19 1700      PT SHORT TERM GOAL #1   Title  Pt will be independent with HEP in order to improve strength and balance in order to decrease fall risk and improve function at home.    Time  6    Period  Weeks    Status  Achieved    Target Date  03/26/19        PT Long Term Goals - 05/06/19 1210      PT LONG TERM GOAL #1   Title  Pt will improve BERG by at least 3 points in order to demonstrate clinically significant improvement in balance.    Baseline  02/12/19: 49/56; 03/19/19: 52/56    Time  12    Period  Weeks    Status  Achieved      PT LONG TERM GOAL #2   Title  Pt will improve DGI by at least 3 points in order to demonstrate clinically significant improvement in balance and decreased risk for falls.    Baseline  02/12/19: 20/24; 03/19/19: 20/24; 05/01/19: 22/24    Time  12    Period  Weeks    Status  Partially Met    Target Date  06/03/19      PT LONG TERM GOAL #3   Title  Pt will decrease 5TSTS by at least 3 seconds in order to demonstrate clinically significant improvement in LE strength.    Baseline  02/12/19: 19.0s; 03/19/19: 17.15sec; 05/01/19: 14.93s    Time  12    Period  Weeks    Status  Achieved      PT LONG TERM GOAL #4   Title  Pt will improve ABC by at least 13% in order to demonstrate clinically significant improvement in balance confidence.    Baseline  02/12/19: 67.5%; 03/19/19: 72.5%; 05/01/19: 55.6%    Time  12    Period  Weeks    Status  Partially Met    Target Date  06/03/19      PT LONG TERM GOAL #5   Title  Pt will decrease TUG to below 14 seconds/decrease in order to demonstrate decreased fall risk.    Baseline  02/12/19: 20.4s; 03/19/19: 16.25s; 05/01/19: 13.34s    Time  12    Period  Weeks    Status  Achieved      PT LONG  TERM GOAL #6   Title  Pt will increased 6MWT to >1063f  in order to demonstrate clinically significant improvement  in cardiopulmonary endurance and community ambulation.    Baseline  02/21/19: 635f; 03/21/19: 8061f 05/06/19: 78375f  Time  12    Period  Weeks    Status  Revised    Target Date  06/03/19            Plan - 05/15/19 1445    Clinical Impression Statement  Pt demonstrated excellent motivation during today's session. He is able to increase his resistance on the leg press again today and achieves appropriate fatigue by the end of each set. He struggles with balance on uneven surfaces as well as backwards stepping with 4 square pattern. Pt will benefit from PT services to address deficits in strength, balance, and mobility in order to return to full function at home.    Personal Factors and Comorbidities  Age;Fitness;Comorbidity 1;Comorbidity 2;Time since onset of injury/illness/exacerbation    Comorbidities  Depression, CAD    Examination-Activity Limitations  Caring for Others;Squat;Stairs;Stand    Examination-Participation Restrictions  Cleaning;Community Activity;Yard Work;Shop    Stability/Clinical Decision Making  Evolving/Moderate complexity    Rehab Potential  Good    PT Frequency  2x / week    PT Duration  4 weeks    PT Treatment/Interventions  ADLs/Self Care Home Management;Aquatic Therapy;Biofeedback;Canalith Repostioning;Cryotherapy;Electrical Stimulation;Iontophoresis 4mg50m Dexamethasone;Moist Heat;Traction;Ultrasound;DME Instruction;Gait training;Stair training;Functional mobility training;Therapeutic activities;Therapeutic exercise;Balance training;Neuromuscular re-education;Cognitive remediation;Patient/family education;Manual techniques;Vestibular    PT Next Visit Plan  increase step length and cadence; progress strengthening and balance; review HEP    PT Home Exercise Plan  Medbridge Access Code: [email or web address] balance and sit to stand repeats)     Consulted and Agree with Plan of Care  Patient       Patient will benefit from skilled therapeutic intervention in order to improve the following deficits and impairments:  Abnormal gait, Cardiopulmonary status limiting activity, Decreased activity tolerance, Decreased balance, Decreased endurance, Decreased strength, Difficulty walking  Visit Diagnosis: Unsteadiness on feet  Muscle weakness (generalized)     Problem List Patient Active Problem List   Diagnosis Date Noted  . Malignant neoplastic disease (HCC)[place]/20/2016  . Hypercholesterolemia 10/15/2014  . Essential (primary) hypertension 06/02/2014  . Acute blood loss anemia 01/25/2013  . Bloody pleural effusion 01/25/2013  . Pain following surgery or procedure 01/25/2013  . H/O aortic valve replacement 01/25/2013  . H/O coronary artery bypass surgery 01/25/2013  . History of open heart surgery 01/25/2013  . Angina, class II (HCC)[place]/17/2014  . Heart failure (HCC)[place]/17/2014  . 3-vessel CAD 01/17/2013  . Aortic heart valve narrowing 01/17/2013   Bradley Nelson DPT, GCS  Bradley Nelson 05/15/2019, 3:28 PM  [facility]N REHA[facility] CampusVICES [address] [place], [id number]: [phone]:  [phone]: MelvSENICA CRALL: [id number] of Birth: 12/08/09/05/1932

## 2019-05-20 ENCOUNTER — Other Ambulatory Visit: Payer: Self-pay

## 2019-05-20 ENCOUNTER — Ambulatory Visit: Payer: PPO | Admitting: Physical Therapy

## 2019-05-20 ENCOUNTER — Encounter: Payer: Self-pay | Admitting: Physical Therapy

## 2019-05-20 DIAGNOSIS — R278 Other lack of coordination: Secondary | ICD-10-CM

## 2019-05-20 DIAGNOSIS — Z9181 History of falling: Secondary | ICD-10-CM

## 2019-05-20 DIAGNOSIS — R2681 Unsteadiness on feet: Secondary | ICD-10-CM | POA: Diagnosis not present

## 2019-05-20 DIAGNOSIS — M6281 Muscle weakness (generalized): Secondary | ICD-10-CM

## 2019-05-20 NOTE — Therapy (Signed)
[place] MAIN [facility] SERVICES [address] [place], [place], [address] Phone: [phone]   Fax:  [phone]  Physical Therapy Treatment  Patient Details  Name: Bradley Nelson MRN: [id number] Date of Birth: 07-17-1932 Referring Provider (PT): Dr Velna Ochs   Encounter Date: 05/20/2019  PT End of Session - 05/20/19 1247    Visit Number  25    Number of Visits  33    Date for PT Re-Evaluation  06/03/19    Authorization Type  eval 02/12/19, goals updated 05/01/19    PT Start Time  1300    PT Stop Time  1345    PT Time Calculation (min)  45 min    Equipment Utilized During Treatment  Gait belt    Activity Tolerance  Patient tolerated treatment well    Behavior During Therapy  [facility] for tasks assessed/performed       Past Medical History:  Diagnosis Date  . Acute blood loss anemia 01/25/2013  . Angina, class II ([place]) 01/22/2013  . Aortic heart valve narrowing 01/17/2013  . Bloody pleural effusion 01/25/2013  . Essential (primary) hypertension 06/02/2014  . H/O aortic valve replacement 01/25/2013  . H/O coronary artery bypass surgery 01/25/2013   Overview:  cabg x3 with LIMA to LAD, SVG to PDA, SVG to left circumflex with septal muomectomy and aortic valve replacement with Ce Magna bioprosthetic valve on 01-24-13 by [name] at [facility].   . Heart failure ([place]) 01/22/2013  . History of open heart surgery 01/25/2013  . Hypercholesterolemia 10/15/2014  . Malignant neoplastic disease ([place]) 04/28/2015   Overview:  prostate cancer, s/p prostatectomy    . Prostate cancer ([place])   . Skin cancer     Past Surgical History:  Procedure Laterality Date  . AORTIC VALVE REPLACEMENT    . CARDIAC SURGERY    . CORONARY ARTERY BYPASS GRAFT    . HERNIA REPAIR    . PROSTATECTOMY  2001   Dr. [name]    There were no vitals filed for this visit.    Neuro Re-ed: Patient requires CGA during all standing interventions due to limited stability and patient fear  of LOB. Cueing for reduction of UE support required as well as postural alignment for optimal stability within COM  This entire session was performed under direct supervision and direction of a licensed therapist/therapist assistant . I have personally read, edited and approve of the note as written.  TREATMENT     Ther-ex  Warm up on XRide, level 3-4 x 5 min during history (4 minutes unbilled);   Quantum seated leg press 165# x 20, 180# x 20 pt is fatigued at the end of each set; 6" forward step-ups alternating LE without UE support x 10 each; 6" lateral step-ups alternating LE without UE support x 10 each; STS from regular chair without UE support with airex pad underneath feet 2 x 10;     Neuromuscular Re-education  6" alternating step taps from airex foam without UE support x 10 each; Feet together on airex foam static balance 2x30 sec hold Tandem gait across long airex foam x3 laps Side-step across long airex foam x3 laps  Matrix walk-outs 12.5# fwd/bwd x3 laps, 17.5# x2 laps; cues to take backward steps slowly to better control the weight and promote stability of gait.         Pt educated throughout session about proper posture and technique with exercises. Improved exercise technique, movement at target joints, use of  target muscles after min to mod verbal, visual, tactile cues.         Patient demonstrates excellent motivation throughout today's session. Patient was able to reproduce the qualitative aspects to therapeutic exercises with slight fatigue and minimal use of UE support for stability, required occasional seated breaks to rest his legs. Patient continues to be challenged by static and dynamic balance on compliant surfaces, requiring frequent UE support, which improved with min VC for foot positioning and posture, repetition, and demonstrations.        PT Education - 05/20/19 1247    Education Details  balance/strengthening    Person(s) Educated  Patient     Methods  Explanation;Verbal cues    Comprehension  Verbalized understanding;Returned demonstration;Verbal cues required;Need further instruction       PT Short Term Goals - 05/01/19 1700      PT SHORT TERM GOAL #1   Title  Pt will be independent with HEP in order to improve strength and balance in order to decrease fall risk and improve function at home.    Time  6    Period  Weeks    Status  Achieved    Target Date  03/26/19        PT Long Term Goals - 05/06/19 1210      PT LONG TERM GOAL #1   Title  Pt will improve BERG by at least 3 points in order to demonstrate clinically significant improvement in balance.    Baseline  02/12/19: 49/56; 03/19/19: 52/56    Time  12    Period  Weeks    Status  Achieved      PT LONG TERM GOAL #2   Title  Pt will improve DGI by at least 3 points in order to demonstrate clinically significant improvement in balance and decreased risk for falls.    Baseline  02/12/19: 20/24; 03/19/19: 20/24; 05/01/19: 22/24    Time  12    Period  Weeks    Status  Partially Met    Target Date  06/03/19      PT LONG TERM GOAL #3   Title  Pt will decrease 5TSTS by at least 3 seconds in order to demonstrate clinically significant improvement in LE strength.    Baseline  02/12/19: 19.0s; 03/19/19: 17.15sec; 05/01/19: 14.93s    Time  12    Period  Weeks    Status  Achieved      PT LONG TERM GOAL #4   Title  Pt will improve ABC by at least 13% in order to demonstrate clinically significant improvement in balance confidence.    Baseline  02/12/19: 67.5%; 03/19/19: 72.5%; 05/01/19: 55.6%    Time  12    Period  Weeks    Status  Partially Met    Target Date  06/03/19      PT LONG TERM GOAL #5   Title  Pt will decrease TUG to below 14 seconds/decrease in order to demonstrate decreased fall risk.    Baseline  02/12/19: 20.4s; 03/19/19: 16.25s; 05/01/19: 13.34s    Time  12    Period  Weeks    Status  Achieved      PT LONG TERM GOAL #6   Title  Pt will increased 6MWT to >1014f   in order to demonstrate clinically significant improvement in cardiopulmonary endurance and community ambulation.    Baseline  02/21/19: 6293f 03/21/19: 80077f9/28/20: 783f57f Time  12    Period  Weeks    Status  Revised    Target Date  06/03/19              Patient will benefit from skilled therapeutic intervention in order to improve the following deficits and impairments:     Visit Diagnosis: Unsteadiness on feet  Muscle weakness (generalized)  Other lack of coordination  History of falling     Problem List Patient Active Problem List   Diagnosis Date Noted  . Malignant neoplastic disease ([place]) 04/28/2015  . Hypercholesterolemia 10/15/2014  . Essential (primary) hypertension 06/02/2014  . Acute blood loss anemia 01/25/2013  . Bloody pleural effusion 01/25/2013  . Pain following surgery or procedure 01/25/2013  . H/O aortic valve replacement 01/25/2013  . H/O coronary artery bypass surgery 01/25/2013  . History of open heart surgery 01/25/2013  . Angina, class II ([place]) 01/22/2013  . Heart failure ([place]) 01/22/2013  . 3-vessel CAD 01/17/2013  . Aortic heart valve narrowing 01/17/2013   Natalie Mceuen A. Rosana Hoes, SPT  Alean Rinne 05/20/2019, 12:48 PM Alanson Puls, PT, DPT [place] MAIN [facility] SERVICES [address] [place], [place], [address] Phone: [phone]   Fax:  [phone]  Name: JARREL KNOKE MRN: [id number] Date of Birth: 1931-12-21

## 2019-05-22 ENCOUNTER — Other Ambulatory Visit: Payer: Self-pay

## 2019-05-22 ENCOUNTER — Ambulatory Visit: Payer: PPO

## 2019-05-22 ENCOUNTER — Ambulatory Visit: Payer: PPO | Admitting: Physical Therapy

## 2019-05-22 ENCOUNTER — Encounter: Payer: Self-pay | Admitting: Physical Therapy

## 2019-05-22 DIAGNOSIS — Z9181 History of falling: Secondary | ICD-10-CM

## 2019-05-22 DIAGNOSIS — M6281 Muscle weakness (generalized): Secondary | ICD-10-CM

## 2019-05-22 DIAGNOSIS — R29898 Other symptoms and signs involving the musculoskeletal system: Secondary | ICD-10-CM

## 2019-05-22 DIAGNOSIS — R2681 Unsteadiness on feet: Secondary | ICD-10-CM

## 2019-05-22 DIAGNOSIS — R278 Other lack of coordination: Secondary | ICD-10-CM

## 2019-05-22 NOTE — Therapy (Signed)
[place] MAIN [facility] SERVICES [address] [place], [place], [address] Phone: [phone]   Fax:  [phone]  Physical Therapy Treatment  Patient Details  Name: Bradley Nelson MRN: [id number] Date of Birth: 10-13-1931 Referring Provider (PT): Dr Velna Ochs   Encounter Date: 05/22/2019  PT End of Session - 05/22/19 1041    Visit Number  26    Number of Visits  33    Date for PT Re-Evaluation  06/03/19    Authorization Type  eval 02/12/19, goals updated 05/01/19    PT Start Time  1100    PT Stop Time  1145    PT Time Calculation (min)  45 min    Equipment Utilized During Treatment  Gait belt    Activity Tolerance  Patient tolerated treatment well    Behavior During Therapy  [facility] for tasks assessed/performed       Past Medical History:  Diagnosis Date  . Acute blood loss anemia 01/25/2013  . Angina, class II ([place]) 01/22/2013  . Aortic heart valve narrowing 01/17/2013  . Bloody pleural effusion 01/25/2013  . Essential (primary) hypertension 06/02/2014  . H/O aortic valve replacement 01/25/2013  . H/O coronary artery bypass surgery 01/25/2013   Overview:  cabg x3 with LIMA to LAD, SVG to PDA, SVG to left circumflex with septal muomectomy and aortic valve replacement with Ce Magna bioprosthetic valve on 01-24-13 by [name] at [facility].   . Heart failure ([place]) 01/22/2013  . History of open heart surgery 01/25/2013  . Hypercholesterolemia 10/15/2014  . Malignant neoplastic disease ([place]) 04/28/2015   Overview:  prostate cancer, s/p prostatectomy    . Prostate cancer ([place])   . Skin cancer     Past Surgical History:  Procedure Laterality Date  . AORTIC VALVE REPLACEMENT    . CARDIAC SURGERY    . CORONARY ARTERY BYPASS GRAFT    . HERNIA REPAIR    . PROSTATECTOMY  2001   Dr. [name]    There were no vitals filed for this visit.    This entire session was performed under direct supervision and direction of a licensed therapist/therapist  assistant . I have personally read, edited and approve of the note as written.   Neuro Re-ed: Patient requires CGA during all standing interventions due to limited stability and patient fear of LOB. Cueing for reduction of UE support required as well as postural alignment for optimal stability within COM    TREATMENT     Ther-ex  Warm up on XRide, level 3-4 x 5 min  Additional LE strengthening deferred due to time restrictions.     Neuromuscular Re-education  6" alternating step taps from airex foam without UE support x 10 each; Feet together on firm surface balloon taps x3 min to address LOB when reaching outside BOS;  Tandem gait across long airex foam x3 laps; min VC to plant foot heel-toe along foam, tendency to place feet adjacently and results in LOB; frequent rail assist Side-step across long airex foam x3 laps; min VC to point toes anteriorly Fwd walking picking up/placing  #4 cones to address balance deficits when picking up objects x5 laps Fwd walking on red mat picking up/placing cone at end of // bars x 5 min to simulate picking up newspaper in grass; patient reports increased BLE fatigue, no significant LOB noted, occasional UE rail support when during 180 degrees.    Matrix walk-outs 17.5# fwd/bwd x5 laps; cues to take backward steps slowly  to better control the weight and promote stability of gait.    Patient demonstrates excellent motivation throughout today's session. Patient was able to perform advanced balance intervention with no significant LOB this date, required occasional/frequent UE rail assist when compliant surfaces are involved. Patient continues to be challenged by unsteadiness when reaching outside BOS or bending over, which improved with repetition and min VC to go slowly and orient himself to his surroundings before bending over.        PT Education - 05/22/19 1041    Education Details  balance/strengthening    Person(s) Educated  Patient    Methods   Explanation;Verbal cues    Comprehension  Verbalized understanding;Returned demonstration;Verbal cues required;Need further instruction       PT Short Term Goals - 05/01/19 1700      PT SHORT TERM GOAL #1   Title  Pt will be independent with HEP in order to improve strength and balance in order to decrease fall risk and improve function at home.    Time  6    Period  Weeks    Status  Achieved    Target Date  03/26/19        PT Long Term Goals - 05/06/19 1210      PT LONG TERM GOAL #1   Title  Pt will improve BERG by at least 3 points in order to demonstrate clinically significant improvement in balance.    Baseline  02/12/19: 49/56; 03/19/19: 52/56    Time  12    Period  Weeks    Status  Achieved      PT LONG TERM GOAL #2   Title  Pt will improve DGI by at least 3 points in order to demonstrate clinically significant improvement in balance and decreased risk for falls.    Baseline  02/12/19: 20/24; 03/19/19: 20/24; 05/01/19: 22/24    Time  12    Period  Weeks    Status  Partially Met    Target Date  06/03/19      PT LONG TERM GOAL #3   Title  Pt will decrease 5TSTS by at least 3 seconds in order to demonstrate clinically significant improvement in LE strength.    Baseline  02/12/19: 19.0s; 03/19/19: 17.15sec; 05/01/19: 14.93s    Time  12    Period  Weeks    Status  Achieved      PT LONG TERM GOAL #4   Title  Pt will improve ABC by at least 13% in order to demonstrate clinically significant improvement in balance confidence.    Baseline  02/12/19: 67.5%; 03/19/19: 72.5%; 05/01/19: 55.6%    Time  12    Period  Weeks    Status  Partially Met    Target Date  06/03/19      PT LONG TERM GOAL #5   Title  Pt will decrease TUG to below 14 seconds/decrease in order to demonstrate decreased fall risk.    Baseline  02/12/19: 20.4s; 03/19/19: 16.25s; 05/01/19: 13.34s    Time  12    Period  Weeks    Status  Achieved      PT LONG TERM GOAL #6   Title  Pt will increased 6MWT to >1043f  in  order to demonstrate clinically significant improvement in cardiopulmonary endurance and community ambulation.    Baseline  02/21/19: 626f 03/21/19: 80068f9/28/20: 783f94f Time  12    Period  Weeks    Status  Revised  Target Date  06/03/19              Patient will benefit from skilled therapeutic intervention in order to improve the following deficits and impairments:     Visit Diagnosis: Unsteadiness on feet  Muscle weakness (generalized)  Other lack of coordination  History of falling  Other symptoms and signs involving the musculoskeletal system     Problem List Patient Active Problem List   Diagnosis Date Noted  . Malignant neoplastic disease ([place]) 04/28/2015  . Hypercholesterolemia 10/15/2014  . Essential (primary) hypertension 06/02/2014  . Acute blood loss anemia 01/25/2013  . Bloody pleural effusion 01/25/2013  . Pain following surgery or procedure 01/25/2013  . H/O aortic valve replacement 01/25/2013  . H/O coronary artery bypass surgery 01/25/2013  . History of open heart surgery 01/25/2013  . Angina, class II ([place]) 01/22/2013  . Heart failure ([place]) 01/22/2013  . 3-vessel CAD 01/17/2013  . Aortic heart valve narrowing 01/17/2013   Lynnleigh Soden A. Rosana Hoes, SPT  Alean Rinne 05/22/2019, 10:42 AM Alanson Puls, PT, DPT [place] MAIN [facility] SERVICES [address] [place], [place], [address] Phone: [phone]   Fax:  [phone]  Name: Bradley Nelson MRN: [id number] Date of Birth: May 25, 1932

## 2019-05-27 ENCOUNTER — Other Ambulatory Visit: Payer: Self-pay

## 2019-05-27 ENCOUNTER — Ambulatory Visit: Payer: PPO

## 2019-05-27 VITALS — BP 106/57 | HR 66

## 2019-05-27 DIAGNOSIS — M6281 Muscle weakness (generalized): Secondary | ICD-10-CM

## 2019-05-27 DIAGNOSIS — R2681 Unsteadiness on feet: Secondary | ICD-10-CM | POA: Diagnosis not present

## 2019-05-27 NOTE — Therapy (Signed)
[place] MAIN [facility] SERVICES [address] [place], [place], [address] Phone: [phone]   Fax:  [phone]  Physical Therapy Treatment  Patient Details  Name: FRANCISO DIERKS MRN: [id number] Date of Birth: August 27, 1931 Referring Provider (PT): Dr Velna Ochs   Encounter Date: 05/27/2019  PT End of Session - 05/27/19 1607    Visit Number  27    Number of Visits  33    Date for PT Re-Evaluation  06/03/19    Authorization Type  eval 02/12/19, goals updated 05/01/19    PT Start Time  1515    PT Stop Time  1602    PT Time Calculation (min)  47 min    Equipment Utilized During Treatment  Gait belt    Activity Tolerance  Patient tolerated treatment well    Behavior During Therapy  [facility] for tasks assessed/performed       Past Medical History:  Diagnosis Date  . Acute blood loss anemia 01/25/2013  . Angina, class II ([place]) 01/22/2013  . Aortic heart valve narrowing 01/17/2013  . Bloody pleural effusion 01/25/2013  . Essential (primary) hypertension 06/02/2014  . H/O aortic valve replacement 01/25/2013  . H/O coronary artery bypass surgery 01/25/2013   Overview:  cabg x3 with LIMA to LAD, SVG to PDA, SVG to left circumflex with septal muomectomy and aortic valve replacement with Ce Magna bioprosthetic valve on 01-24-13 by [name] at [facility].   . Heart failure ([place]) 01/22/2013  . History of open heart surgery 01/25/2013  . Hypercholesterolemia 10/15/2014  . Malignant neoplastic disease ([place]) 04/28/2015   Overview:  prostate cancer, s/p prostatectomy    . Prostate cancer ([place])   . Skin cancer     Past Surgical History:  Procedure Laterality Date  . AORTIC VALVE REPLACEMENT    . CARDIAC SURGERY    . CORONARY ARTERY BYPASS GRAFT    . HERNIA REPAIR    . PROSTATECTOMY  2001   Dr. [name]    Vitals:   05/27/19 1538  BP: (!) 106/57  Pulse: 66  SpO2: 100%    Subjective Assessment - 05/27/19 1523    Subjective  Pt is doing well today.  He  states that he has some chronic neck soreness today, but nothing notable.  He denies any new falls since last visit    Pertinent History  Pt reports that he has been struggling with his balance since his wife passed approximately 1 year ago. He states that he has had "1-2 falls" in the last 12 months. Otherwise he denies any other known triggers or recent changes in his health. He has a significant cardiac history of CAD and artificial aortic valve with normal EF on last echo. He is followed by cardiology.    Patient Stated Goals  "Improve my balance"    Currently in Pain?  No/denies         TREATMENT   Ther-ex Warm up onNuSTEP, level3-4x 6mnduring history (4 minutes unbilled);  Quantum seated leg press 180# x 20; 195# x15 pt is fatigued at the end of each set; Quantum calf raises 150# 2x20; moderate cueing needed for technique Side steps in // bars with red band x5 lengths   Neuromuscular Re-education 1/2 foam roller (flat side up) with tandem stance 2x 30s with each foot in front. 1/2 foam roller (flat side up) heel/toe weight shifting x10 in each direction; Forward step-ups onto 6" box with airex pad without UE support 2x10 each;  Pt educated throughout session about proper posture and technique with exercises. Improved exercise technique, movement at target joints, use of target muscles after min to mod verbal, visual, tactile cues.    Pt demonstrated excellent motivation during today's session.He is able to increase his resistance on the leg press again today and is able to add in calf raises as well without difficulty.  He does require cueing on forward step ups for proper technique, and has difficulty with the addition of the foam pad during step ups, requiring up to minA for LOBs.  He demonstrates significant difficulty with tandem balance on the 1/2 foam roll, requiring up to modA for balance recovery.  Pt will benefit from PT services to address  deficits in strength, balance, and mobility in order to return to full function at home.                PT Short Term Goals - 05/01/19 1700      PT SHORT TERM GOAL #1   Title  Pt will be independent with HEP in order to improve strength and balance in order to decrease fall risk and improve function at home.    Time  6    Period  Weeks    Status  Achieved    Target Date  03/26/19        PT Long Term Goals - 05/06/19 1210      PT LONG TERM GOAL #1   Title  Pt will improve BERG by at least 3 points in order to demonstrate clinically significant improvement in balance.    Baseline  02/12/19: 49/56; 03/19/19: 52/56    Time  12    Period  Weeks    Status  Achieved      PT LONG TERM GOAL #2   Title  Pt will improve DGI by at least 3 points in order to demonstrate clinically significant improvement in balance and decreased risk for falls.    Baseline  02/12/19: 20/24; 03/19/19: 20/24; 05/01/19: 22/24    Time  12    Period  Weeks    Status  Partially Met    Target Date  06/03/19      PT LONG TERM GOAL #3   Title  Pt will decrease 5TSTS by at least 3 seconds in order to demonstrate clinically significant improvement in LE strength.    Baseline  02/12/19: 19.0s; 03/19/19: 17.15sec; 05/01/19: 14.93s    Time  12    Period  Weeks    Status  Achieved      PT LONG TERM GOAL #4   Title  Pt will improve ABC by at least 13% in order to demonstrate clinically significant improvement in balance confidence.    Baseline  02/12/19: 67.5%; 03/19/19: 72.5%; 05/01/19: 55.6%    Time  12    Period  Weeks    Status  Partially Met    Target Date  06/03/19      PT LONG TERM GOAL #5   Title  Pt will decrease TUG to below 14 seconds/decrease in order to demonstrate decreased fall risk.    Baseline  02/12/19: 20.4s; 03/19/19: 16.25s; 05/01/19: 13.34s    Time  12    Period  Weeks    Status  Achieved      PT LONG TERM GOAL #6   Title  Pt will increased 6MWT to >1051f  in order to demonstrate  clinically significant improvement in cardiopulmonary endurance and community ambulation.    Baseline  02/21/19: 652f; 03/21/19: 8082f 05/06/19: 78352f  Time  12    Period  Weeks    Status  Revised    Target Date  06/03/19            Plan - 05/27/19 1606    Clinical Impression Statement  Pt demonstrated excellent motivation during today's session. He is able to increase his resistance on the leg press again today and is able to add in calf raises as well without difficulty.  He does require cueing on forward step ups for proper technique, and has difficulty with the addition of the foam pad during step ups, requiring up to minA for LOBs.  He demonstrates significant difficulty with tandem balance on the 1/2 foam roll, requiring up to modA for balance recovery.   Pt will benefit from PT services to address deficits in strength, balance, and mobility in order to return to full function at home.    Personal Factors and Comorbidities  Age;Fitness;Comorbidity 1;Comorbidity 2;Time since onset of injury/illness/exacerbation    Comorbidities  Depression, CAD    Examination-Activity Limitations  Caring for Others;Squat;Stairs;Stand    Examination-Participation Restrictions  Cleaning;Community Activity;Yard Work;Shop    Stability/Clinical Decision Making  Evolving/Moderate complexity    Rehab Potential  Good    PT Frequency  2x / week    PT Duration  4 weeks    PT Treatment/Interventions  ADLs/Self Care Home Management;Aquatic Therapy;Biofeedback;Canalith Repostioning;Cryotherapy;Electrical Stimulation;Iontophoresis 4mg19m Dexamethasone;Moist Heat;Traction;Ultrasound;DME Instruction;Gait training;Stair training;Functional mobility training;Therapeutic activities;Therapeutic exercise;Balance training;Neuromuscular re-education;Cognitive remediation;Patient/family education;Manual techniques;Vestibular    PT Next Visit Plan  increase step length and cadence; progress strengthening and balance; review  HEP    PT Home Exercise Plan  Medbridge Access Code: [email or web address] balance and sit to stand repeats)    Consulted and Agree with Plan of Care  Patient       Patient will benefit from skilled therapeutic intervention in order to improve the following deficits and impairments:  Abnormal gait, Cardiopulmonary status limiting activity, Decreased activity tolerance, Decreased balance, Decreased endurance, Decreased strength, Difficulty walking  Visit Diagnosis: Unsteadiness on feet  Muscle weakness (generalized)     Problem List Patient Active Problem List   Diagnosis Date Noted  . Malignant neoplastic disease (HCC)[place]/20/2016  . Hypercholesterolemia 10/15/2014  . Essential (primary) hypertension 06/02/2014  . Acute blood loss anemia 01/25/2013  . Bloody pleural effusion 01/25/2013  . Pain following surgery or procedure 01/25/2013  . H/O aortic valve replacement 01/25/2013  . H/O coronary artery bypass surgery 01/25/2013  . History of open heart surgery 01/25/2013  . Angina, class II (HCC)[place]/17/2014  . Heart failure (HCC)[place]/17/2014  . 3-vessel CAD 01/17/2013  . Aortic heart valve narrowing 01/17/2013    This entire session was performed under direct supervision and direction of a licensed therapist/therapist assistant . I have personally read, edited and approve of the note as written.   TracLutricia HorsfallT JasoPhillips Grout DPT, GCS  Huprich,Jason 05/28/2019, 5:17 PM  [facility] [address], [place]ne: [phone]:  [phone]: MelvNEERAJ HOUSAND: [id number] of Birth: [birth year]

## 2019-05-29 ENCOUNTER — Other Ambulatory Visit: Payer: Self-pay

## 2019-05-29 ENCOUNTER — Ambulatory Visit: Payer: PPO

## 2019-05-29 DIAGNOSIS — R2681 Unsteadiness on feet: Secondary | ICD-10-CM

## 2019-05-29 DIAGNOSIS — M6281 Muscle weakness (generalized): Secondary | ICD-10-CM

## 2019-05-29 NOTE — Therapy (Signed)
[place] MAIN [facility] SERVICES [address] [place], [place], [address] Phone: [phone]   Fax:  [phone]  Physical Therapy Treatment  Patient Details  Name: Bradley Nelson MRN: [id number] Date of Birth: [birth year] Referring Provider (PT): Dr Velna Ochs   Encounter Date: 05/29/2019  PT End of Session - 05/29/19 1425    Visit Number  28    Number of Visits  33    Date for PT Re-Evaluation  06/03/19    Authorization Type  eval 02/12/19, goals updated 05/01/19    PT Start Time  1430    PT Stop Time  1515    PT Time Calculation (min)  45 min    Equipment Utilized During Treatment  Gait belt    Activity Tolerance  Patient tolerated treatment well    Behavior During Therapy  [facility] for tasks assessed/performed       Past Medical History:  Diagnosis Date  . Acute blood loss anemia 01/25/2013  . Angina, class II ([place]) 01/22/2013  . Aortic heart valve narrowing 01/17/2013  . Bloody pleural effusion 01/25/2013  . Essential (primary) hypertension 06/02/2014  . H/O aortic valve replacement 01/25/2013  . H/O coronary artery bypass surgery 01/25/2013   Overview:  cabg x3 with LIMA to LAD, SVG to PDA, SVG to left circumflex with septal muomectomy and aortic valve replacement with Ce Magna bioprosthetic valve on 01-24-13 by [name] at [facility].   . Heart failure ([place]) 01/22/2013  . History of open heart surgery 01/25/2013  . Hypercholesterolemia 10/15/2014  . Malignant neoplastic disease ([place]) 04/28/2015   Overview:  prostate cancer, s/p prostatectomy    . Prostate cancer ([place])   . Skin cancer     Past Surgical History:  Procedure Laterality Date  . AORTIC VALVE REPLACEMENT    . CARDIAC SURGERY    . CORONARY ARTERY BYPASS GRAFT    . HERNIA REPAIR    . PROSTATECTOMY  2001   Dr. [name]    There were no vitals filed for this visit.  Subjective Assessment - 05/29/19 1439    Subjective  Pt is doing well today.  He reports no pain or soreness  today.  He denies any new falls since last visit.  He is agreeable to discharging after his re-evaluation on Monday.  No new questions or concerns at this time.    Pertinent History  Pt reports that he has been struggling with his balance since his wife passed approximately 1 year ago. He states that he has had "1-2 falls" in the last 12 months. Otherwise he denies any other known triggers or recent changes in his health. He has a significant cardiac history of CAD and artificial aortic valve with normal EF on last echo. He is followed by cardiology.    Patient Stated Goals  "Improve my balance"    Currently in Pain?  No/denies       TREATMENT   Ther-ex Warm up onX-Ride, level3-4x 33mnduring history (5 minutes unbilled);  Quantum seated leg press 180# x 20; 195# x15ptis fatigued at the end of each set; STSs with feet on airex pad and no UE support 2x12   Neuromuscular Re-education Bosu squats x10 with flat side down Bosu marching with flat side down 2x10 airex balance beam tandem forward and backward walking x5 in each direction with SUE support throughout most of intervention and up to minA for steadying. airex balance beam side stepping x5 in each direction with intermittent SUE  support and CGA throughout.  He did require 3 instances of minA for steadying with LOB. 1/2 foam roller (flat side up) with tandem stance rocking R and L x10s with each foot in front. 1/2 foam roller (flat side up) heel/toe weight shifting x10 in each direction;   Pt educated throughout session about proper posture and technique with exercises. Improved exercise technique, movement at target joints, use of target muscles after min to mod verbal, visual, tactile cues.    Pt demonstrated excellent motivation during today's session.  His HEP was reviewed and modified today in preparation for discharge next week.  Pt was amenable to the idea of discharging next week.  He will have 1 more PT  session next week to reassess goals and to address any last questions or concerns that he may have.  He demonstrates difficulty on airex balance beam tandem walking, requiring frequent use of the UE and up to minA  for steadying throughout.  Pt will benefit from PT services to address deficits in strength, balance, and mobility in order to return to full function at home.                          PT Short Term Goals - 05/01/19 1700      PT SHORT TERM GOAL #1   Title  Pt will be independent with HEP in order to improve strength and balance in order to decrease fall risk and improve function at home.    Time  6    Period  Weeks    Status  Achieved    Target Date  03/26/19        PT Long Term Goals - 05/06/19 1210      PT LONG TERM GOAL #1   Title  Pt will improve BERG by at least 3 points in order to demonstrate clinically significant improvement in balance.    Baseline  02/12/19: 49/56; 03/19/19: 52/56    Time  12    Period  Weeks    Status  Achieved      PT LONG TERM GOAL #2   Title  Pt will improve DGI by at least 3 points in order to demonstrate clinically significant improvement in balance and decreased risk for falls.    Baseline  02/12/19: 20/24; 03/19/19: 20/24; 05/01/19: 22/24    Time  12    Period  Weeks    Status  Partially Met    Target Date  06/03/19      PT LONG TERM GOAL #3   Title  Pt will decrease 5TSTS by at least 3 seconds in order to demonstrate clinically significant improvement in LE strength.    Baseline  02/12/19: 19.0s; 03/19/19: 17.15sec; 05/01/19: 14.93s    Time  12    Period  Weeks    Status  Achieved      PT LONG TERM GOAL #4   Title  Pt will improve ABC by at least 13% in order to demonstrate clinically significant improvement in balance confidence.    Baseline  02/12/19: 67.5%; 03/19/19: 72.5%; 05/01/19: 55.6%    Time  12    Period  Weeks    Status  Partially Met    Target Date  06/03/19      PT LONG TERM GOAL #5   Title  Pt will  decrease TUG to below 14 seconds/decrease in order to demonstrate decreased fall risk.    Baseline  02/12/19: 20.4s; 03/19/19:  16.25s; 05/01/19: 13.34s    Time  12    Period  Weeks    Status  Achieved      PT LONG TERM GOAL #6   Title  Pt will increased 6MWT to >1032f  in order to demonstrate clinically significant improvement in cardiopulmonary endurance and community ambulation.    Baseline  02/21/19: 6288f 03/21/19: 80034f9/28/20: [id number] Time  12    Period  Weeks    Status  Revised    Target Date  06/03/19            Plan - 05/29/19 1808    Clinical Impression Statement  Pt demonstrated excellent motivation during today's session.  His HEP was reviewed and modified today in preparation for discharge next week.  Pt was amenable to the idea of discharging next week.  He will have 1 more PT session next week to reassess goals and to address any last questions or concerns that he may have.  He demonstrates difficulty on airex balance beam tandem walking, requiring frequent use of the UE and up to minA  for steadying throughout.  Pt will benefit from PT services to address deficits in strength, balance, and mobility in order to return to full function at home.    Personal Factors and Comorbidities  Age;Fitness;Comorbidity 1;Comorbidity 2;Time since onset of injury/illness/exacerbation    Comorbidities  Depression, CAD    Examination-Activity Limitations  Caring for Others;Squat;Stairs;Stand    Examination-Participation Restrictions  Cleaning;Community Activity;Yard Work;Shop    Stability/Clinical Decision Making  Evolving/Moderate complexity    Rehab Potential  Good    PT Frequency  2x / week    PT Duration  4 weeks    PT Treatment/Interventions  ADLs/Self Care Home Management;Aquatic Therapy;Biofeedback;Canalith Repostioning;Cryotherapy;Electrical Stimulation;Iontophoresis 4mg/75mDexamethasone;Moist Heat;Traction;Ultrasound;DME Instruction;Gait training;Stair training;Functional mobility  training;Therapeutic activities;Therapeutic exercise;Balance training;Neuromuscular re-education;Cognitive remediation;Patient/family education;Manual techniques;Vestibular    PT Next Visit Plan  reassess goals next session; review any questions or concerns before discharge    PT Home Exercise Plan  Medbridge Access Code: [email or web address]    Consulted and Agree with Plan of Care  Patient       Patient will benefit from skilled therapeutic intervention in order to improve the following deficits and impairments:  Abnormal gait, Cardiopulmonary status limiting activity, Decreased activity tolerance, Decreased balance, Decreased endurance, Decreased strength, Difficulty walking  Visit Diagnosis: Unsteadiness on feet  Muscle weakness (generalized)     Problem List Patient Active Problem List   Diagnosis Date Noted  . Malignant neoplastic disease (HCC) [place]20/2016  . Hypercholesterolemia 10/15/2014  . Essential (primary) hypertension 06/02/2014  . Acute blood loss anemia 01/25/2013  . Bloody pleural effusion 01/25/2013  . Pain following surgery or procedure 01/25/2013  . H/O aortic valve replacement 01/25/2013  . H/O coronary artery bypass surgery 01/25/2013  . History of open heart surgery 01/25/2013  . Angina, class II (HCC) [place]17/2014  . Heart failure (HCC) [place]17/2014  . 3-vessel CAD 01/17/2013  . Aortic heart valve narrowing 01/17/2013     TracyLutricia Horsfall JasonPhillips GroutDPT, GCS  Huprich,Jason 05/30/2019, 11:15 AM  [facility]AB[facility] - NapaICES [id number]: [phone]:  [phone]: MelviWARNER LADUCA [phone] of Birth: [birth year]

## 2019-05-29 NOTE — Patient Instructions (Signed)
Access Code: [email or web address]  URL: [email or web address]  Date: 05/29/2019  Prepared by: Roxana Hires   Exercises Sit to Stand without Arm Support - 10 reps - 2 sets - 1x daily - 7x weekly Standing Hip Abduction with Resistance at Ankles and Counter Support - 10 reps - 2 sets - 1x daily - 7x weekly Standing Hip Extension with Resistance at Ankles and Unilateral Counter Support - 10 reps - 2 sets - 1x daily - 7x weekly Tandem Walking with Counter Support - 10 reps - 2 sets - 1x daily                            - 7x weekly Backward Tandem Walking with Counter Support - 10 reps - 2 sets - 1x daily - 7x weekly Standing March with Counter Support - 10 reps - 2 sets - 1x daily - 7x weekly Standing Tandem Balance with Counter Support - 3 sets - 30 hold - 2x daily - 7x weekly

## 2019-06-03 ENCOUNTER — Ambulatory Visit: Payer: PPO

## 2019-06-03 ENCOUNTER — Other Ambulatory Visit: Payer: Self-pay

## 2019-06-03 DIAGNOSIS — M6281 Muscle weakness (generalized): Secondary | ICD-10-CM

## 2019-06-03 DIAGNOSIS — R2681 Unsteadiness on feet: Secondary | ICD-10-CM

## 2019-06-03 NOTE — Therapy (Signed)
[place] MAIN [facility] SERVICES [address] [place], [place], [address] Phone: [phone]   Fax:  [phone]  Physical Therapy Discharge  Patient Details  Name: Bradley Nelson MRN: [id number] Date of Birth: August 28, 1931 Referring Provider (PT): Dr Velna Ochs   Encounter Date: 06/03/2019  PT End of Session - 06/03/19 1528    Visit Number  29    Number of Visits  33    Date for PT Re-Evaluation  06/03/19    Authorization Type  eval 02/12/19, goals updated 06/03/19    PT Start Time  1520    PT Stop Time  1600    PT Time Calculation (min)  40 min    Equipment Utilized During Treatment  Gait belt    Activity Tolerance  Patient tolerated treatment well    Behavior During Therapy  [facility] for tasks assessed/performed       Past Medical History:  Diagnosis Date  . Acute blood loss anemia 01/25/2013  . Angina, class II ([place]) 01/22/2013  . Aortic heart valve narrowing 01/17/2013  . Bloody pleural effusion 01/25/2013  . Essential (primary) hypertension 06/02/2014  . H/O aortic valve replacement 01/25/2013  . H/O coronary artery bypass surgery 01/25/2013   Overview:  cabg x3 with LIMA to LAD, SVG to PDA, SVG to left circumflex with septal muomectomy and aortic valve replacement with Ce Magna bioprosthetic valve on 01-24-13 by [name] at [facility].   . Heart failure ([place]) 01/22/2013  . History of open heart surgery 01/25/2013  . Hypercholesterolemia 10/15/2014  . Malignant neoplastic disease ([place]) 04/28/2015   Overview:  prostate cancer, s/p prostatectomy    . Prostate cancer ([place])   . Skin cancer     Past Surgical History:  Procedure Laterality Date  . AORTIC VALVE REPLACEMENT    . CARDIAC SURGERY    . CORONARY ARTERY BYPASS GRAFT    . HERNIA REPAIR    . PROSTATECTOMY  2001   Dr. [name]    There were no vitals filed for this visit.  Subjective Assessment - 06/03/19 1525    Subjective  Pt reports that he has been having LBP the past 2 days.  He  states that he had a flat tire on Friday, and that he may have irritated his back on Friday when he and his neighbor were working with the tire.  He states that he feels prepared and is ready for discharge at this time.  No new questions or concerns at this time.    Pertinent History  Pt reports that he has been struggling with his balance since his wife passed approximately 1 year ago. He states that he has had "1-2 falls" in the last 12 months. Otherwise he denies any other known triggers or recent changes in his health. He has a significant cardiac history of CAD and artificial aortic valve with normal EF on last echo. He is followed by cardiology.    Patient Stated Goals  "Improve my balance"    Currently in Pain?  Yes    Pain Score  7     Pain Location  Back    Pain Orientation  Lower    Pain Descriptors / Indicators  Sharp;Aching    Pain Type  Acute pain    Pain Onset  In the past 7 days         [facility] PT Assessment - 06/03/19 1533      6 Minute Walk- Baseline   6 Minute  Walk- Baseline  yes    BP (mmHg)  127/66    HR (bpm)  64    02 Sat (%RA)  100 %    Modified Borg Scale for Dyspnea  0- Nothing at all    Perceived Rate of Exertion (Borg)  6-      6 Minute walk- Post Test   6 Minute Walk Post Test  yes    BP (mmHg)  129/73    HR (bpm)  65    02 Sat (%RA)  100 %    Modified Borg Scale for Dyspnea  5- Strong or hard breathing    Perceived Rate of Exertion (Borg)  13- Somewhat hard      6 minute walk test results    Aerobic Endurance Distance Walked  810      Dynamic Gait Index   Level Surface  Normal    Change in Gait Speed  Mild Impairment    Gait with Horizontal Head Turns  Normal    Gait with Vertical Head Turns  Normal    Gait and Pivot Turn  Normal    Step Over Obstacle  Mild Impairment    Step Around Obstacles  Normal    Steps  Mild Impairment    Total Score  21          TREATMENT  Ther-ex Warm up onX-Ride, level3-4x 64mnduring history (5 minutes  unbilled);   Goals Reassessed today:  ABC: 71.9%  DGI: 21/24  6MWT: 810'   Pt educated throughout session about proper posture and technique with exercises. Improved exercise technique, movement at target joints, use of target muscles after min to mod verbal, visual, tactile cues.    Pt demonstrated excellent motivation during his physical therapy sessions.  He has met 1 STG and 3 LTGs to date.  Over the course of his physical therapy sessions, he progressed his 6MWT distance from 625 ft to 810 ft.  He has also self-reported improvement in his balance confidence, endorsing a 67.5% at baseline and a 71.9% today.  His DGI remains relatively unchanged over the course of this physical therapy sessions, still demonstrating difficulty with gait speed changes.  He also demonstrates independence with his HEP and states that he feels prepared and comfortable with continuing with his HEP upon discharge.  He was introduced to the [facility] today, and was given information about joining if he feels like doing so in the future.  At this time, he is ready for discharge, having improved his balance, strength, and endurance.  He was informed that if problems arise in the future that he can get a new referral from his provider.           PT Short Term Goals - 06/03/19 1615      PT SHORT TERM GOAL #1   Title  Pt will be independent with HEP in order to improve strength and balance in order to decrease fall risk and improve function at home.    Time  6    Period  Weeks    Status  Achieved    Target Date  03/26/19        PT Long Term Goals - 06/03/19 1616      PT LONG TERM GOAL #1   Title  Pt will improve BERG by at least 3 points in order to demonstrate clinically significant improvement in balance.    Baseline  02/12/19: 49/56; 03/19/19: 52/56    Time  12  Period  Weeks    Status  Achieved      PT LONG TERM GOAL #2   Title  Pt will improve DGI by at least 3 points in order to  demonstrate clinically significant improvement in balance and decreased risk for falls.    Baseline  02/12/19: 20/24; 03/19/19: 20/24; 05/01/19: 22/24; 06/03/19: 21/24    Time  12    Period  Weeks    Status  Partially Met    Target Date  06/03/19      PT LONG TERM GOAL #3   Title  Pt will decrease 5TSTS by at least 3 seconds in order to demonstrate clinically significant improvement in LE strength.    Baseline  02/12/19: 19.0s; 03/19/19: 17.15sec; 05/01/19: 14.93s    Time  12    Period  Weeks    Status  Achieved      PT LONG TERM GOAL #4   Title  Pt will improve ABC by at least 13% in order to demonstrate clinically significant improvement in balance confidence.    Baseline  02/12/19: 67.5%; 03/19/19: 72.5%; 05/01/19: 55.6%; 06/03/19: 71.9%    Time  12    Period  Weeks    Status  Partially Met    Target Date  06/03/19      PT LONG TERM GOAL #5   Title  Pt will decrease TUG to below 14 seconds/decrease in order to demonstrate decreased fall risk.    Baseline  02/12/19: 20.4s; 03/19/19: 16.25s; 05/01/19: 13.34s    Time  12    Period  Weeks    Status  Achieved      PT LONG TERM GOAL #6   Title  Pt will increased 6MWT to >1057f  in order to demonstrate clinically significant improvement in cardiopulmonary endurance and community ambulation.    Baseline  02/21/19: 6234f 03/21/19: 80030f9/28/20: [id number]/26/20: 810ft18fTime  12    Period  Weeks    Status  Partially Met    Target Date  06/03/19            Plan - 06/03/19 1626    Clinical Impression Statement  Pt demonstrated excellent motivation during his physical therapy sessions.  He has met 1 STG and 3 LTGs to date.  Over the course of his physical therapy sessions, he progressed his 6MWT distance from 625 ft to 810 ft.  He has also self-reported improvement in his balance confidence, endorsing a 67.5% at baseline and a 71.9% today.  His DGI remains relatively unchanged over the course of this physical therapy sessions, still  demonstrating difficulty with gait speed changes.  He also demonstrates independence with his HEP and states that he feels prepared and comfortable with continuing with his HEP upon discharge.  He was introduced to the [facility] today, and was given information about joining if he feels like doing so in the future.  At this time, he is ready for discharge, having improved his balance, strength, and endurance.  He was informed that if problems arise in the future that he can get a new referral from his provider.    Personal Factors and Comorbidities  Age;Fitness;Comorbidity 1;Comorbidity 2;Time since onset of injury/illness/exacerbation    Comorbidities  Depression, CAD    Examination-Activity Limitations  Caring for Others;Squat;Stairs;Stand    Examination-Participation Restrictions  Cleaning;Community Activity;Yard Work;Shop    Stability/Clinical Decision Making  Evolving/Moderate complexity    Rehab Potential  Good    PT Frequency  2x /  week    PT Duration  4 weeks    PT Treatment/Interventions  ADLs/Self Care Home Management;Aquatic Therapy;Biofeedback;Canalith Repostioning;Cryotherapy;Electrical Stimulation;Iontophoresis 86m/ml Dexamethasone;Moist Heat;Traction;Ultrasound;DME Instruction;Gait training;Stair training;Functional mobility training;Therapeutic activities;Therapeutic exercise;Balance training;Neuromuscular re-education;Cognitive remediation;Patient/family education;Manual techniques;Vestibular    PT Next Visit Plan  discharging today    PT Home Exercise Plan  Medbridge Access Code: [email or web address]   Consulted and Agree with Plan of Care  Patient       Patient will benefit from skilled therapeutic intervention in order to improve the following deficits and impairments:  Abnormal gait, Cardiopulmonary status limiting activity, Decreased activity tolerance, Decreased balance, Decreased endurance, Decreased strength, Difficulty walking  Visit Diagnosis: Unsteadiness on feet  Muscle  weakness (generalized)     Problem List Patient Active Problem List   Diagnosis Date Noted  . Malignant neoplastic disease ([place] 04/28/2015  . Hypercholesterolemia 10/15/2014  . Essential (primary) hypertension 06/02/2014  . Acute blood loss anemia 01/25/2013  . Bloody pleural effusion 01/25/2013  . Pain following surgery or procedure 01/25/2013  . H/O aortic valve replacement 01/25/2013  . H/O coronary artery bypass surgery 01/25/2013  . History of open heart surgery 01/25/2013  . Angina, class II ([place] 01/22/2013  . Heart failure ([place] 01/22/2013  . 3-vessel CAD 01/17/2013  . Aortic heart valve narrowing 01/17/2013    This entire session was performed under direct supervision and direction of a licensed therapist/therapist assistant . I have personally read, edited and approve of the note as written.   TLutricia Horsfall SPT JPhillips GroutPT, DPT, GCS  Bradley Nelson,Bradley Nelson 06/04/2019, 3:34 PM  CBarrettMAIN [facility] Center[facility] [address] [address]Phone: [phone]  Fax:  [phone] Name: Bradley ROUNDSMRN: [id number] of Birth: 511/18/1933

## 2019-06-04 DIAGNOSIS — I10 Essential (primary) hypertension: Secondary | ICD-10-CM | POA: Diagnosis not present

## 2019-06-04 DIAGNOSIS — I340 Nonrheumatic mitral (valve) insufficiency: Secondary | ICD-10-CM | POA: Diagnosis not present

## 2019-06-04 DIAGNOSIS — I2510 Atherosclerotic heart disease of native coronary artery without angina pectoris: Secondary | ICD-10-CM | POA: Diagnosis not present

## 2019-06-04 DIAGNOSIS — E7800 Pure hypercholesterolemia, unspecified: Secondary | ICD-10-CM | POA: Diagnosis not present

## 2019-06-04 DIAGNOSIS — Z951 Presence of aortocoronary bypass graft: Secondary | ICD-10-CM | POA: Diagnosis not present

## 2019-06-04 DIAGNOSIS — Z953 Presence of xenogenic heart valve: Secondary | ICD-10-CM | POA: Diagnosis not present

## 2019-06-05 ENCOUNTER — Ambulatory Visit: Payer: PPO

## 2019-06-10 ENCOUNTER — Ambulatory Visit: Payer: PPO | Admitting: Physical Therapy

## 2019-06-12 ENCOUNTER — Ambulatory Visit: Payer: PPO | Admitting: Physical Therapy

## 2019-08-10 ENCOUNTER — Other Ambulatory Visit: Payer: Self-pay

## 2019-08-10 ENCOUNTER — Encounter: Payer: Self-pay | Admitting: Emergency Medicine

## 2019-08-10 ENCOUNTER — Inpatient Hospital Stay
Admission: EM | Admit: 2019-08-10 | Discharge: 2019-08-13 | DRG: 178 | Disposition: A | Discharge status: Home Health | Payer: PPO | Attending: Internal Medicine | Admitting: Internal Medicine

## 2019-08-10 ENCOUNTER — Emergency Department: Payer: PPO

## 2019-08-10 DIAGNOSIS — G9340 Encephalopathy, unspecified: Secondary | ICD-10-CM

## 2019-08-10 DIAGNOSIS — E785 Hyperlipidemia, unspecified: Secondary | ICD-10-CM | POA: Diagnosis present

## 2019-08-10 DIAGNOSIS — Z953 Presence of xenogenic heart valve: Secondary | ICD-10-CM

## 2019-08-10 DIAGNOSIS — E7800 Pure hypercholesterolemia, unspecified: Secondary | ICD-10-CM | POA: Diagnosis present

## 2019-08-10 DIAGNOSIS — Z66 Do not resuscitate: Secondary | ICD-10-CM

## 2019-08-10 DIAGNOSIS — J988 Other specified respiratory disorders: Secondary | ICD-10-CM | POA: Diagnosis present

## 2019-08-10 DIAGNOSIS — I10 Essential (primary) hypertension: Secondary | ICD-10-CM | POA: Diagnosis present

## 2019-08-10 DIAGNOSIS — U071 COVID-19: Secondary | ICD-10-CM | POA: Diagnosis not present

## 2019-08-10 DIAGNOSIS — Z7982 Long term (current) use of aspirin: Secondary | ICD-10-CM | POA: Diagnosis not present

## 2019-08-10 DIAGNOSIS — Z8679 Personal history of other diseases of the circulatory system: Secondary | ICD-10-CM

## 2019-08-10 DIAGNOSIS — Z951 Presence of aortocoronary bypass graft: Secondary | ICD-10-CM

## 2019-08-10 DIAGNOSIS — I459 Conduction disorder, unspecified: Secondary | ICD-10-CM | POA: Diagnosis present

## 2019-08-10 DIAGNOSIS — Z85828 Personal history of other malignant neoplasm of skin: Secondary | ICD-10-CM | POA: Diagnosis not present

## 2019-08-10 DIAGNOSIS — R531 Weakness: Secondary | ICD-10-CM | POA: Diagnosis not present

## 2019-08-10 DIAGNOSIS — Z87891 Personal history of nicotine dependence: Secondary | ICD-10-CM

## 2019-08-10 DIAGNOSIS — Z885 Allergy status to narcotic agent status: Secondary | ICD-10-CM

## 2019-08-10 DIAGNOSIS — Z20828 Contact with and (suspected) exposure to other viral communicable diseases: Secondary | ICD-10-CM | POA: Diagnosis not present

## 2019-08-10 DIAGNOSIS — R0602 Shortness of breath: Secondary | ICD-10-CM | POA: Diagnosis not present

## 2019-08-10 DIAGNOSIS — Z9079 Acquired absence of other genital organ(s): Secondary | ICD-10-CM

## 2019-08-10 DIAGNOSIS — Z79899 Other long term (current) drug therapy: Secondary | ICD-10-CM

## 2019-08-10 DIAGNOSIS — Z8546 Personal history of malignant neoplasm of prostate: Secondary | ICD-10-CM

## 2019-08-10 DIAGNOSIS — F329 Major depressive disorder, single episode, unspecified: Secondary | ICD-10-CM | POA: Diagnosis present

## 2019-08-10 DIAGNOSIS — D696 Thrombocytopenia, unspecified: Secondary | ICD-10-CM | POA: Diagnosis present

## 2019-08-10 DIAGNOSIS — I959 Hypotension, unspecified: Secondary | ICD-10-CM | POA: Diagnosis not present

## 2019-08-10 DIAGNOSIS — G9349 Other encephalopathy: Secondary | ICD-10-CM | POA: Diagnosis present

## 2019-08-10 DIAGNOSIS — I2510 Atherosclerotic heart disease of native coronary artery without angina pectoris: Secondary | ICD-10-CM | POA: Diagnosis present

## 2019-08-10 DIAGNOSIS — Z8616 Personal history of COVID-19: Secondary | ICD-10-CM | POA: Diagnosis not present

## 2019-08-10 DIAGNOSIS — R930 Abnormal findings on diagnostic imaging of skull and head, not elsewhere classified: Secondary | ICD-10-CM | POA: Diagnosis not present

## 2019-08-10 DIAGNOSIS — F32A Depression, unspecified: Secondary | ICD-10-CM | POA: Diagnosis present

## 2019-08-10 HISTORY — DX: COVID-19: U07.1

## 2019-08-10 LAB — LACTIC ACID, PLASMA: Lactic Acid, Venous: 1.8 mmol/L (ref 0.5–1.9)

## 2019-08-10 LAB — CBC
HCT: 40.9 % (ref 39.0–52.0)
Hemoglobin: 14.1 g/dL (ref 13.0–17.0)
MCH: 32 pg (ref 26.0–34.0)
MCHC: 34.5 g/dL (ref 30.0–36.0)
MCV: 93 fL (ref 80.0–100.0)
Platelets: 97 10*3/uL — ABNORMAL LOW (ref 150–400)
RBC: 4.4 MIL/uL (ref 4.22–5.81)
RDW: 13.8 % (ref 11.5–15.5)
WBC: 6.3 10*3/uL (ref 4.0–10.5)
nRBC: 0 % (ref 0.0–0.2)

## 2019-08-10 LAB — TROPONIN I (HIGH SENSITIVITY): Troponin I (High Sensitivity): 19 ng/L — ABNORMAL HIGH (ref <18)

## 2019-08-10 LAB — COMPREHENSIVE METABOLIC PANEL
ALT: 17 U/L (ref 0–44)
AST: 27 U/L (ref 15–41)
Albumin: 4 g/dL (ref 3.5–5.0)
Alkaline Phosphatase: 54 U/L (ref 38–126)
Anion gap: 11 (ref 5–15)
BUN: 21 mg/dL (ref 8–23)
CO2: 22 mmol/L (ref 22–32)
Calcium: 9.7 mg/dL (ref 8.9–10.3)
Chloride: 105 mmol/L (ref 98–111)
Creatinine, Ser: 1.24 mg/dL (ref 0.61–1.24)
GFR calc Af Amer: >60 mL/min (ref >60)
GFR calc non Af Amer: 53 mL/min — ABNORMAL LOW (ref >60)
Glucose, Bld: 111 mg/dL — ABNORMAL HIGH (ref 70–99)
Potassium: 4.4 mmol/L (ref 3.5–5.1)
Sodium: 138 mmol/L (ref 135–145)
Total Bilirubin: 0.9 mg/dL (ref 0.3–1.2)
Total Protein: 6.9 g/dL (ref 6.5–8.1)

## 2019-08-10 MED ORDER — SODIUM CHLORIDE 0.9 % IV BOLUS
1000.0000 mL | Freq: Once | INTRAVENOUS | Status: AC
  Administered 2019-08-10: 1000 mL via INTRAVENOUS

## 2019-08-10 NOTE — ED Triage Notes (Addendum)
Pt arrived via POV from [facility] with reports of increased SOB, low blood pressure and increased confusion since yesterday.  Pt is alert on arrival. Pt was exposed to [place] 12/25 by family. Pt had COVID vaccine on 12/29.   Pt c/o decreased energy and shortness of breath.

## 2019-08-10 NOTE — ED Provider Notes (Signed)
[facility] Emergency Department Provider Note  ____________________________________________   First MD Initiated Contact with Patient 08/10/19 2105     (approximate)  I have reviewed the triage vital signs and the nursing notes.   HISTORY  Chief Complaint Altered Mental Status and COVID POSITIVE    HPI Bradley Nelson is a 86 y.o. male  With history as below here with confusion, weakness. Pt reportedly has been increasingly confused over the past 2-3 days. He's had known COVID exposures and was recently vaccinated for it. He developed some increasing weakness, confusion, and poor PO intake several days ago, and since then has had worsening confusion and poor appetite. He also had an episode of severe vertigo yesterday, which is abnormal for him. Throughout the day today, he's been more confused and combative. No reported CP or SOB. No focal numbness or weakness. He denies any other complaints on my assessment though limited 2/2 confusion.  Level 5 caveat invoked as remainder of history, ROS, and physical exam limited due to patient's confusion.         Past Medical History:  Diagnosis Date  . Acute blood loss anemia 01/25/2013  . Angina, class II ([place]) 01/22/2013  . Aortic heart valve narrowing 01/17/2013  . Bloody pleural effusion 01/25/2013  . COVID-19   . Essential (primary) hypertension 06/02/2014  . H/O aortic valve replacement 01/25/2013  . H/O coronary artery bypass surgery 01/25/2013   Overview:  cabg x3 with LIMA to LAD, SVG to PDA, SVG to left circumflex with septal muomectomy and aortic valve replacement with Ce Magna bioprosthetic valve on 01-24-13 by [name] at [facility].   . Heart failure ([place]) 01/22/2013  . History of open heart surgery 01/25/2013  . Hypercholesterolemia 10/15/2014  . Malignant neoplastic disease ([place]) 04/28/2015   Overview:  prostate cancer, s/p prostatectomy    . Prostate cancer ([place])   . Skin cancer     Patient Active  Problem List   Diagnosis Date Noted  . Malignant neoplastic disease ([place]) 04/28/2015  . Hypercholesterolemia 10/15/2014  . Essential (primary) hypertension 06/02/2014  . Acute blood loss anemia 01/25/2013  . Bloody pleural effusion 01/25/2013  . Pain following surgery or procedure 01/25/2013  . H/O aortic valve replacement 01/25/2013  . H/O coronary artery bypass surgery 01/25/2013  . History of open heart surgery 01/25/2013  . Angina, class II ([place]) 01/22/2013  . Heart failure ([place]) 01/22/2013  . 3-vessel CAD 01/17/2013  . Aortic heart valve narrowing 01/17/2013    Past Surgical History:  Procedure Laterality Date  . AORTIC VALVE REPLACEMENT    . CARDIAC SURGERY    . CORONARY ARTERY BYPASS GRAFT    . HERNIA REPAIR    . PROSTATECTOMY  2001   Dr. [name]    Prior to Admission medications   Medication Sig Start Date End Date Taking? Authorizing Provider  aspirin 81 MG chewable tablet Chew 1 tablet by mouth daily.    [provider]  cholecalciferol (VITAMIN D3) 25 MCG (1000 UT) tablet Take 1,000 Units by mouth daily.    [provider]  Coenzyme Q10 (CO Q-10) 100 MG CAPS Take 100 mg by mouth daily.    [provider]  Lactobacillus (ACIDOPHILUS) 100 MG CAPS Take 1 capsule by mouth daily.    [provider]  lisinopril (ZESTRIL) 30 MG tablet Take 30 mg by mouth daily.    [provider]  metoprolol (LOPRESSOR) 50 MG tablet Take 1 tablet by mouth 2 (two)  times daily. 12/15/15   [provider]  Multiple Vitamins-Minerals (CENTRUM SILVER) tablet Take 1 tablet by mouth daily.    [provider]  ranitidine (ZANTAC) 150 MG tablet Take 1 tablet by mouth 2 (two) times daily. 01/06/16   [provider]  sertraline (ZOLOFT) 25 MG tablet Take 30 mg by mouth daily.    [provider]  simvastatin (ZOCOR) 80 MG tablet Take 0.5 tablets by mouth every evening. 12/02/15   [provider]     Allergies Hydrocodone and Oxycodone  Family History  Problem Relation Age of Onset  . Prostate cancer Father   . Kidney cancer Neg Hx   . Bladder Cancer Neg Hx     Social History Social History   Tobacco Use  . Smoking status: Former Research scientist (life sciences)  . Smokeless tobacco: Never Used  Substance Use Topics  . Alcohol use: No    Alcohol/week: 0.0 standard drinks  . Drug use: No    Review of Systems  Review of Systems  Unable to perform ROS: Mental status change  Constitutional: Positive for fatigue.  Respiratory: Positive for shortness of breath.   Neurological: Positive for weakness.  Psychiatric/Behavioral: Positive for confusion.     ____________________________________________  PHYSICAL EXAM:      VITAL SIGNS: ED Triage Vitals  Enc Vitals Group     BP 08/10/19 1525 112/60     Pulse Rate 08/10/19 1525 69     Resp 08/10/19 1525 18     Temp 08/10/19 1525 99.2 F (37.3 C)     Temp Source 08/10/19 1525 Oral     SpO2 08/10/19 1525 97 %     Weight 08/10/19 1527 160 lb (72.6 kg)     Height 08/10/19 1527 5\' 7"  (1.702 m)     Head Circumference --      Peak Flow --      Pain Score 08/10/19 1527 0     Pain Loc --      Pain Edu? --      Excl. in [place]? --      Physical Exam Vitals and nursing note reviewed.  Constitutional:      General: He is not in acute distress.    Appearance: He is well-developed.  HENT:     Head: Normocephalic and atraumatic.     Mouth/Throat:     Mouth: Mucous membranes are dry.  Eyes:     Conjunctiva/sclera: Conjunctivae normal.  Cardiovascular:     Rate and Rhythm: Normal rate and regular rhythm.     Heart sounds: Normal heart sounds.  Pulmonary:     Effort: Pulmonary effort is normal. No respiratory distress.     Breath sounds: Rales present. No wheezing.  Abdominal:     General: There is no distension.  Musculoskeletal:     Cervical back: Neck supple.  Skin:    General: Skin is warm.     Capillary Refill: Capillary refill takes  less than 2 seconds.     Findings: No rash.  Neurological:     Mental Status: He is alert. He is disoriented.     Motor: No abnormal muscle tone.       ____________________________________________   LABS (all labs ordered are listed, but only abnormal results are displayed)  Labs Reviewed  COMPREHENSIVE METABOLIC PANEL - Abnormal; Notable for the following components:      Result Value   Glucose, Bld 111 (*)    GFR calc non Af Amer 53 (*)  All other components within normal limits  CBC - Abnormal; Notable for the following components:   Platelets 97 (*)    All other components within normal limits  LACTIC ACID, PLASMA  LACTIC ACID, PLASMA  URINALYSIS, COMPLETE (UACMP) WITH MICROSCOPIC  TROPONIN I (HIGH SENSITIVITY)    ____________________________________________  EKG: Normal sinus rhythm, VR 83. QRS 131, QTc 453. No acute ST elevations or depressions. Anterior Q waves, possibly LVH. ________________________________________  RADIOLOGY All imaging, including plain films, CT scans, and ultrasounds, independently reviewed by me, and interpretations confirmed via formal radiology reads.  ED MD interpretation:   CXR: No acute abnormality CT Head: [place]  Official radiology report(s): DG Chest 2 View  Result Date: 08/10/2019 CLINICAL DATA:  Shortness of breath, hypotension, increased confusion EXAM: CHEST - 2 VIEW COMPARISON:  01/16/2016 FINDINGS: Status post median sternotomy and CABG with aortic valve prosthesis. Both lungs are clear. Disc degenerative disease of the thoracic spine. IMPRESSION: No acute abnormality of the lungs. Electronically Signed   By: Eddie Candle M.D.   On: 08/10/2019 17:15   CT Head Wo Contrast  Result Date: 08/10/2019 CLINICAL DATA:  Increased shortness of breath, history of COVID exposure EXAM: CT HEAD WITHOUT CONTRAST TECHNIQUE: Contiguous axial images were obtained from the base of the skull through the vertex without intravenous contrast.  COMPARISON:  CT brain 01/16/2016 FINDINGS: Brain: No acute territorial infarction, hemorrhage or intracranial mass. Moderate atrophy. Mild hypodensity in the white matter consistent with chronic small vessel ischemic change. Enlarged ventricles but stable. Vascular: No hyperdense vessels.  Carotid vascular calcification Skull: Normal. Negative for fracture or focal lesion. Sinuses/Orbits: Postsurgical changes of the maxillary sinuses. Other: None IMPRESSION: 1. No CT evidence for acute intracranial abnormality. 2. Atrophy and small vessel ischemic changes of the white matter Electronically Signed   By: Donavan Foil M.D.   On: 08/10/2019 23:11    ____________________________________________  PROCEDURES   Procedure(s) performed (including Critical Care):  Procedures  ____________________________________________  INITIAL IMPRESSION / MDM / [place] / ED COURSE  As part of my medical decision making, I reviewed the following data within the [place] notes reviewed and incorporated, Old chart reviewed, Notes from prior ED visits, and [place] Controlled Substance Database       *JASEON BORS was evaluated in Emergency Department on 08/10/2019 for the symptoms described in the history of present illness. He was evaluated in the context of the global COVID-19 pandemic, which necessitated consideration that the patient might be at risk for infection with the SARS-CoV-2 virus that causes COVID-19. Institutional protocols and algorithms that pertain to the evaluation of patients at risk for COVID-19 are in a state of rapid change based on information released by regulatory bodies including the CDC and federal and state organizations. These policies and algorithms were followed during the patient's care in the ED.  Some ED evaluations and interventions may be delayed as a result of limited staffing during the pandemic.*     Medical Decision Making:  86 yo M here with  generalized weakness, confusion. COVID+ on outpatient labs. He was reportedly hypotensive and hypoxic at PCPs, now no longer hypoxic. He is confused here, tachypneic but non-toxic. He is unable to ambulate 2/2 weakness. Normally lives alone. IVF given.   ____________________________________________  FINAL CLINICAL IMPRESSION(S) / ED DIAGNOSES  Final diagnoses:  Acute encephalopathy  Generalized weakness  COVID-19     MEDICATIONS GIVEN DURING THIS VISIT:  Medications  sodium chloride 0.9 % bolus  1,000 mL (1,000 mLs Intravenous New Bag/Given 08/10/19 2252)     ED Discharge Orders    None       Note:  This document was prepared using Dragon voice recognition software and may include unintentional dictation errors.   Duffy Bruce, MD 08/10/19 [phone]

## 2019-08-11 ENCOUNTER — Encounter: Payer: Self-pay | Admitting: Family Medicine

## 2019-08-11 DIAGNOSIS — Z66 Do not resuscitate: Secondary | ICD-10-CM

## 2019-08-11 DIAGNOSIS — E785 Hyperlipidemia, unspecified: Secondary | ICD-10-CM | POA: Diagnosis present

## 2019-08-11 DIAGNOSIS — F329 Major depressive disorder, single episode, unspecified: Secondary | ICD-10-CM | POA: Diagnosis present

## 2019-08-11 DIAGNOSIS — Z953 Presence of xenogenic heart valve: Secondary | ICD-10-CM | POA: Diagnosis not present

## 2019-08-11 DIAGNOSIS — E7800 Pure hypercholesterolemia, unspecified: Secondary | ICD-10-CM | POA: Diagnosis present

## 2019-08-11 DIAGNOSIS — Z87891 Personal history of nicotine dependence: Secondary | ICD-10-CM | POA: Diagnosis not present

## 2019-08-11 DIAGNOSIS — Z7982 Long term (current) use of aspirin: Secondary | ICD-10-CM | POA: Diagnosis not present

## 2019-08-11 DIAGNOSIS — Z85828 Personal history of other malignant neoplasm of skin: Secondary | ICD-10-CM | POA: Diagnosis not present

## 2019-08-11 DIAGNOSIS — G9349 Other encephalopathy: Secondary | ICD-10-CM | POA: Diagnosis present

## 2019-08-11 DIAGNOSIS — I2510 Atherosclerotic heart disease of native coronary artery without angina pectoris: Secondary | ICD-10-CM

## 2019-08-11 DIAGNOSIS — Z8679 Personal history of other diseases of the circulatory system: Secondary | ICD-10-CM | POA: Diagnosis not present

## 2019-08-11 DIAGNOSIS — U071 COVID-19: Secondary | ICD-10-CM | POA: Diagnosis present

## 2019-08-11 DIAGNOSIS — Z951 Presence of aortocoronary bypass graft: Secondary | ICD-10-CM | POA: Diagnosis not present

## 2019-08-11 DIAGNOSIS — D696 Thrombocytopenia, unspecified: Secondary | ICD-10-CM | POA: Diagnosis present

## 2019-08-11 DIAGNOSIS — Z885 Allergy status to narcotic agent status: Secondary | ICD-10-CM | POA: Diagnosis not present

## 2019-08-11 DIAGNOSIS — R531 Weakness: Secondary | ICD-10-CM | POA: Diagnosis not present

## 2019-08-11 DIAGNOSIS — Z9079 Acquired absence of other genital organ(s): Secondary | ICD-10-CM | POA: Diagnosis not present

## 2019-08-11 DIAGNOSIS — Z79899 Other long term (current) drug therapy: Secondary | ICD-10-CM | POA: Diagnosis not present

## 2019-08-11 DIAGNOSIS — I459 Conduction disorder, unspecified: Secondary | ICD-10-CM | POA: Diagnosis present

## 2019-08-11 DIAGNOSIS — G9340 Encephalopathy, unspecified: Secondary | ICD-10-CM

## 2019-08-11 DIAGNOSIS — J988 Other specified respiratory disorders: Secondary | ICD-10-CM | POA: Diagnosis present

## 2019-08-11 DIAGNOSIS — I10 Essential (primary) hypertension: Secondary | ICD-10-CM | POA: Diagnosis present

## 2019-08-11 DIAGNOSIS — Z8546 Personal history of malignant neoplasm of prostate: Secondary | ICD-10-CM | POA: Diagnosis not present

## 2019-08-11 LAB — LACTATE DEHYDROGENASE: LDH: 177 U/L (ref 98–192)

## 2019-08-11 LAB — URINALYSIS, COMPLETE (UACMP) WITH MICROSCOPIC
Bacteria, UA: NONE SEEN
Bilirubin Urine: NEGATIVE
Glucose, UA: NEGATIVE mg/dL
Hgb urine dipstick: NEGATIVE
Ketones, ur: NEGATIVE mg/dL
Leukocytes,Ua: NEGATIVE
Nitrite: NEGATIVE
Protein, ur: NEGATIVE mg/dL
Specific Gravity, Urine: 1.023 (ref 1.005–1.030)
pH: 5 (ref 5.0–8.0)

## 2019-08-11 LAB — TROPONIN I (HIGH SENSITIVITY): Troponin I (High Sensitivity): 20 ng/L — ABNORMAL HIGH (ref <18)

## 2019-08-11 LAB — ABO/RH: ABO/RH(D): O NEG

## 2019-08-11 LAB — LACTIC ACID, PLASMA: Lactic Acid, Venous: 1.4 mmol/L (ref 0.5–1.9)

## 2019-08-11 LAB — FERRITIN: Ferritin: 179 ng/mL (ref 24–336)

## 2019-08-11 LAB — D-DIMER, QUANTITATIVE: D-Dimer, Quant: 1.27 ug/mL-FEU — ABNORMAL HIGH (ref 0.00–0.50)

## 2019-08-11 LAB — C-REACTIVE PROTEIN: CRP: 2.4 mg/dL — ABNORMAL HIGH (ref <1.0)

## 2019-08-11 MED ORDER — SODIUM CHLORIDE 0.9 % IV SOLN: INTRAVENOUS | Status: DC

## 2019-08-11 MED ORDER — ENOXAPARIN SODIUM 40 MG/0.4ML ~~LOC~~ SOLN
40.0000 mg | SUBCUTANEOUS | Status: DC
  Administered 2019-08-11 – 2019-08-13 (×3): 40 mg via SUBCUTANEOUS
  Filled 2019-08-11 (×3): qty 0.4

## 2019-08-11 MED ORDER — VITAMIN D 25 MCG (1000 UNIT) PO TABS
1000.0000 [IU] | ORAL_TABLET | Freq: Every day | ORAL | Status: DC
  Administered 2019-08-11 – 2019-08-13 (×3): 1000 [IU] via ORAL
  Filled 2019-08-11 (×3): qty 1

## 2019-08-11 MED ORDER — TRAZODONE HCL 50 MG PO TABS
25.0000 mg | ORAL_TABLET | Freq: Every evening | ORAL | Status: DC | PRN
  Administered 2019-08-11: 25 mg via ORAL
  Filled 2019-08-11: qty 0.5
  Filled 2019-08-11: qty 1

## 2019-08-11 MED ORDER — SODIUM CHLORIDE 0.9 % IV SOLN
200.0000 mg | Freq: Once | INTRAVENOUS | Status: AC
  Administered 2019-08-11: 01:00:00 200 mg via INTRAVENOUS
  Filled 2019-08-11: qty 200

## 2019-08-11 MED ORDER — FAMOTIDINE 20 MG PO TABS: 20.0000 mg | ORAL_TABLET | Freq: Two times a day (BID) | ORAL | Status: DC

## 2019-08-11 MED ORDER — MAGNESIUM HYDROXIDE 400 MG/5ML PO SUSP: 30.0000 mL | Freq: Every day | ORAL | Status: DC | PRN

## 2019-08-11 MED ORDER — ALFUZOSIN HCL ER 10 MG PO TB24
10.0000 mg | ORAL_TABLET | Freq: Every day | ORAL | Status: DC
  Administered 2019-08-11 – 2019-08-13 (×3): 10 mg via ORAL
  Filled 2019-08-11 (×3): qty 1

## 2019-08-11 MED ORDER — ONDANSETRON HCL 4 MG PO TABS
4.0000 mg | ORAL_TABLET | Freq: Four times a day (QID) | ORAL | Status: DC | PRN
  Filled 2019-08-11: qty 1

## 2019-08-11 MED ORDER — ASCORBIC ACID 500 MG PO TABS
500.0000 mg | ORAL_TABLET | Freq: Every day | ORAL | Status: DC
  Administered 2019-08-11 – 2019-08-13 (×3): 500 mg via ORAL
  Filled 2019-08-11 (×3): qty 1

## 2019-08-11 MED ORDER — SERTRALINE HCL 50 MG PO TABS
25.0000 mg | ORAL_TABLET | Freq: Every day | ORAL | Status: DC
  Administered 2019-08-11 – 2019-08-13 (×3): 25 mg via ORAL
  Filled 2019-08-11 (×3): qty 1

## 2019-08-11 MED ORDER — RISAQUAD PO CAPS
1.0000 | ORAL_CAPSULE | Freq: Every day | ORAL | Status: DC
  Administered 2019-08-11 – 2019-08-13 (×3): 1 via ORAL
  Filled 2019-08-11 (×3): qty 1

## 2019-08-11 MED ORDER — ADULT MULTIVITAMIN W/MINERALS CH
1.0000 | ORAL_TABLET | Freq: Every day | ORAL | Status: DC
  Administered 2019-08-11 – 2019-08-13 (×3): 1 via ORAL
  Filled 2019-08-11 (×3): qty 1

## 2019-08-11 MED ORDER — ONDANSETRON HCL 4 MG/2ML IJ SOLN: 4.0000 mg | Freq: Four times a day (QID) | INTRAMUSCULAR | Status: DC | PRN

## 2019-08-11 MED ORDER — SODIUM CHLORIDE 0.9 % IV SOLN
100.0000 mg | Freq: Every day | INTRAVENOUS | Status: DC
  Administered 2019-08-12 – 2019-08-13 (×2): 100 mg via INTRAVENOUS
  Filled 2019-08-11: qty 20
  Filled 2019-08-11 (×2): qty 100

## 2019-08-11 MED ORDER — CO Q-10 100 MG PO CAPS: 100.0000 mg | ORAL_CAPSULE | Freq: Every day | ORAL | Status: DC

## 2019-08-11 MED ORDER — LISINOPRIL 10 MG PO TABS
10.0000 mg | ORAL_TABLET | Freq: Every day | ORAL | Status: DC
  Administered 2019-08-11 – 2019-08-13 (×3): 10 mg via ORAL
  Filled 2019-08-11 (×3): qty 1

## 2019-08-11 MED ORDER — ATORVASTATIN CALCIUM 20 MG PO TABS
40.0000 mg | ORAL_TABLET | Freq: Every day | ORAL | Status: DC
  Administered 2019-08-11 – 2019-08-12 (×2): 40 mg via ORAL
  Filled 2019-08-11 (×3): qty 2

## 2019-08-11 MED ORDER — METOPROLOL TARTRATE 25 MG PO TABS
25.0000 mg | ORAL_TABLET | Freq: Two times a day (BID) | ORAL | Status: DC
  Administered 2019-08-11 – 2019-08-12 (×4): 25 mg via ORAL
  Filled 2019-08-11 (×4): qty 1
  Filled 2019-08-11: qty 0.5

## 2019-08-11 MED ORDER — ACETAMINOPHEN 325 MG PO TABS: 650.0000 mg | ORAL_TABLET | Freq: Four times a day (QID) | ORAL | Status: DC | PRN

## 2019-08-11 MED ORDER — ZINC SULFATE 220 (50 ZN) MG PO CAPS
220.0000 mg | ORAL_CAPSULE | Freq: Every day | ORAL | Status: DC
  Administered 2019-08-11 – 2019-08-13 (×3): 220 mg via ORAL
  Filled 2019-08-11 (×3): qty 1

## 2019-08-11 MED ORDER — FAMOTIDINE 20 MG PO TABS
20.0000 mg | ORAL_TABLET | Freq: Every day | ORAL | Status: DC
  Administered 2019-08-11 – 2019-08-13 (×3): 20 mg via ORAL
  Filled 2019-08-11 (×4): qty 1

## 2019-08-11 MED ORDER — ASPIRIN 81 MG PO CHEW
81.0000 mg | CHEWABLE_TABLET | Freq: Every day | ORAL | Status: DC
  Administered 2019-08-11 – 2019-08-13 (×3): 81 mg via ORAL
  Filled 2019-08-11 (×3): qty 1

## 2019-08-11 NOTE — Progress Notes (Signed)
  PROGRESS NOTE    Bradley Nelson  [id number] DOB: April 27, 1932 DOA: 08/10/2019  PCP: Bradley Hire, MD    LOS - 0    Patient admitted early this morning with altered mental status, generalized weakness and known exposure to COVID-19 positive patient.  Is being treated with remdesivir, vitamin C, zinc.  No steroids as patient has not been hypoxic.    I have reviewed the hull H&P by Dr. Sidney Nelson in detail and I agree with the assessment and plan as outlined therein.   In addition: --PT/OT evaluations --monitor oxygen saturations, supplement if needed to keep sat > 90% --would start steroid if he develops hypoxia   Bradley Slocumb, DO [facility]   If 7PM-7AM, please contact night-coverage [email or web address] Password [facility] 08/11/2019, 2:23 PM

## 2019-08-11 NOTE — ED Notes (Signed)
Niece contact Tacy Learn [phone]

## 2019-08-11 NOTE — Progress Notes (Signed)
PT Cancellation Note  Patient Details Name: Bradley Nelson MRN: [id number] DOB: [birth year]   Cancelled Treatment:    Reason Eval/Treat Not Completed: Medical issues which prohibited therapy; Per chart review and conversation with nursing pt presenting with delirium, confusion, and combativeness during the night and morning now lethargic/sleeping heavily.  Will attempt to see pt at a future date/time as medically appropriate.    Linus Salmons PT, DPT 08/11/19, 1:27 PM

## 2019-08-11 NOTE — ED Notes (Addendum)
This RN and Cyril Mourning, nurse tech to room after hearing a crash to find pt in floor- assisted pt back in bed and asked him why he was trying to get out of bed- per pt he had to use the bathroom, when asked why he didn't use his call bell he states "stubbornness"- pt denies hitting head- skin tear noted to the back of the left arm

## 2019-08-11 NOTE — ED Notes (Signed)
Pt given meal tray. Pt stating he does not want to eat at this time. This RN informed pt to hit call bell when he is ready to eat and this RN would help reposition pt in bed to eat.

## 2019-08-11 NOTE — ED Notes (Signed)
Called floor. Name and ascom number left. Estill Bamberg states floor RN should call this RN soon.

## 2019-08-11 NOTE — ED Notes (Signed)
Dr Arbutus Ped notified of pt fall

## 2019-08-11 NOTE — H&P (Addendum)
With abnormal skin   [facility] [place] NAME: Bradley Nelson    MR#:  [id number]  DATE OF BIRTH:  1931-10-13  DATE OF ADMISSION:  08/10/2018  PRIMARY CARE PHYSICIAN: Baxter Hire, MD   REQUESTING/REFERRING PHYSICIAN: Duffy Bruce, MD  CHIEF COMPLAINT:   Chief Complaint  Patient presents with  . Altered Mental Status  . COVID POSITIVE    HISTORY OF PRESENT ILLNESS:  Bradley Nelson  is a 86 y.o. male with a known history of coronary disease status post CABG, hypertension, dyslipidemia and CHF, who presented to the emergency room with acute onset of altered mental status with confusion and combativeness with generalized weakness.  He usually lives alone and asked his niece to stay with him overnight.  When he came to the ER he was not able to ambulate and has had hallucinating.  No reported fever or chills.  No reported nausea or vomiting or abdominal pain or diarrhea.  No reported headache or blurred vision.  He had an episode of severe vertigo yesterday.  No cough or wheezing or dyspnea.  Upon presentation to the emergency room, temperature was 99.2 with otherwise normal vital signs.  Labs revealed normal CMP.  High-sensitivity troponin I was 19 and later 29,008.8 and later 1.4.  CBC was unremarkable except for thrombocytopenia with platelets of 97.  We added LDH which came back normal at 177 and Ferritin was 179. CRP is pending. The patient had known Covid exposure and was recently vaccinated a couple of days ago.  EKG showed normal sinus rhythm with rate of 82 with poor R wave progression and intraventricular conduction delay and LVH by voltage criteria..  Two-view chest x-ray showed no acute cardiopulmonary disease.  Noncontrasted head CT scan showed atrophy and small vessel ischemic changes with no acute intracranial abnormalities.  The patient was given 1 L bolus of IV normal saline and was started on IV remdesivir.  He will be admitted to a medical  monitored bed for further evaluation and management. PAST MEDICAL HISTORY:   Past Medical History:  Diagnosis Date  . Acute blood loss anemia 01/25/2013  . Angina, class II ([place]) 01/22/2013  . Aortic heart valve narrowing 01/17/2013  . Bloody pleural effusion 01/25/2013  . COVID-19   . Essential (primary) hypertension 06/02/2014  . H/O aortic valve replacement 01/25/2013  . H/O coronary artery bypass surgery 01/25/2013   Overview:  cabg x3 with LIMA to LAD, SVG to PDA, SVG to left circumflex with septal muomectomy and aortic valve replacement with Ce Magna bioprosthetic valve on 01-24-13 by [name] at [facility].   . Heart failure ([place]) 01/22/2013  . History of open heart surgery 01/25/2013  . Hypercholesterolemia 10/15/2014  . Malignant neoplastic disease ([place]) 04/28/2015   Overview:  prostate cancer, s/p prostatectomy    . Prostate cancer ([place])   . Skin cancer     PAST SURGICAL HISTORY:   Past Surgical History:  Procedure Laterality Date  . AORTIC VALVE REPLACEMENT    . CARDIAC SURGERY    . CORONARY ARTERY BYPASS GRAFT    . HERNIA REPAIR    . PROSTATECTOMY  2001   Dr. [name]    SOCIAL HISTORY:   Social History   Tobacco Use  . Smoking status: Former Research scientist (life sciences)  . Smokeless tobacco: Never Used  Substance Use Topics  . Alcohol use: No    Alcohol/week: 0.0 standard drinks    FAMILY HISTORY:   Family History  Problem Relation Age  of Onset  . Prostate cancer Father   . Kidney cancer Neg Hx   . Bladder Cancer Neg Hx     DRUG ALLERGIES:   Allergies  Allergen Reactions  . Hydrocodone Other (See Comments)  . Oxycodone Other (See Comments)    REVIEW OF SYSTEMS:   ROS As per history of present illness. All pertinent systems were reviewed above. Constitutional,  HEENT, cardiovascular, respiratory, GI, GU, musculoskeletal, neuro, psychiatric, endocrine,  integumentary and hematologic systems were reviewed and are otherwise  negative/unremarkable except for positive findings  mentioned above in the HPI.   MEDICATIONS AT HOME:   Prior to Admission medications   Medication Sig Start Date End Date Taking? Authorizing Provider  alfuzosin (UROXATRAL) 10 MG 24 hr tablet Take 10 mg by mouth daily. 07/29/19  Yes [provider]  aspirin 81 MG chewable tablet Chew 1 tablet by mouth daily.   Yes [provider]  cholecalciferol (VITAMIN D3) 25 MCG (1000 UT) tablet Take 1,000 Units by mouth daily.   Yes [provider]  Coenzyme Q10 (CO Q-10) 100 MG CAPS Take 100 mg by mouth daily.   Yes [provider]  famotidine (PEPCID) 20 MG tablet Take 20 mg by mouth 2 (two) times daily. 07/23/19  Yes [provider]  Lactobacillus (ACIDOPHILUS) 100 MG CAPS Take 1 capsule by mouth daily.   Yes [provider]  lisinopril (ZESTRIL) 10 MG tablet Take 10 mg by mouth daily. 05/14/19  Yes [provider]  metoprolol tartrate (LOPRESSOR) 25 MG tablet Take 25 mg by mouth 2 (two) times daily. 05/14/19  Yes [provider]  Multiple Vitamins-Minerals (CENTRUM SILVER) tablet Take 1 tablet by mouth daily.   Yes [provider]  sertraline (ZOLOFT) 25 MG tablet Take 30 mg by mouth daily.   Yes [provider]  simvastatin (ZOCOR) 80 MG tablet Take 0.5 tablets by mouth every evening. 12/02/15  Yes [provider]  amoxicillin (AMOXIL) 500 MG capsule Take 1,000 mg by mouth 2 (two) times daily. 08/03/19   [provider]      VITAL SIGNS:  Blood pressure 117/71, pulse (!) 59, temperature 99.2 F (37.3 C), temperature source Oral, resp. rate 17, height 5\' 7"  (1.702 m), weight 72.6 kg, SpO2 96 %.  PHYSICAL EXAMINATION:  Physical Exam  GENERAL:  87 y.o.-year-old Caucasian male patient lying in the bed with no acute distress.  EYES: Pupils equal, round, reactive to light and accommodation. No scleral icterus. Extraocular muscles intact.  HEENT: Head atraumatic, normocephalic. Oropharynx and  nasopharynx clear.  NECK:  Supple, no jugular venous distention. No thyroid enlargement, no tenderness.  LUNGS: Normal breath sounds bilaterally, no wheezing, rales,rhonchi or crepitation. No use of accessory muscles of respiration.  CARDIOVASCULAR: Regular rate and rhythm, S1, S2 normal. No murmurs, rubs, or gallops.  ABDOMEN: Soft, nondistended, nontender. Bowel sounds present. No organomegaly or mass.  EXTREMITIES: No pedal edema, cyanosis, or clubbing.  NEUROLOGIC: Cranial nerves II through XII are intact. Muscle strength 5/5 in all extremities. Sensation intact. Gait not checked.  PSYCHIATRIC: The patient is alert and oriented x 3.  Normal affect and good eye contact. SKIN: No obvious rash, lesion, or ulcer.   LABORATORY PANEL:   CBC Recent Labs  Lab 08/10/19 1533  WBC 6.3  HGB 14.1  HCT 40.9  PLT 97*   ------------------------------------------------------------------------------------------------------------------  Chemistries  Recent Labs  Lab 08/10/19 1533  NA 138  K 4.4  CL 105  CO2 22  GLUCOSE 111*  BUN 21  CREATININE 1.24  CALCIUM 9.7  AST 27  ALT 17  ALKPHOS 54  BILITOT 0.9   ------------------------------------------------------------------------------------------------------------------  Cardiac Enzymes No results for input(s): TROPONINI in the last 168 hours. ------------------------------------------------------------------------------------------------------------------  RADIOLOGY:  DG Chest 2 View  Result Date: 08/10/2019 CLINICAL DATA:  Shortness of breath, hypotension, increased confusion EXAM: CHEST - 2 VIEW COMPARISON:  01/16/2016 FINDINGS: Status post median sternotomy and CABG with aortic valve prosthesis. Both lungs are clear. Disc degenerative disease of the thoracic spine. IMPRESSION: No acute abnormality of the lungs. Electronically Signed   By: Eddie Candle M.D.   On: 08/10/2019 17:15   CT Head Wo Contrast  Result Date:  08/10/2019 CLINICAL DATA:  Increased shortness of breath, history of COVID exposure EXAM: CT HEAD WITHOUT CONTRAST TECHNIQUE: Contiguous axial images were obtained from the base of the skull through the vertex without intravenous contrast. COMPARISON:  CT brain 01/16/2016 FINDINGS: Brain: No acute territorial infarction, hemorrhage or intracranial mass. Moderate atrophy. Mild hypodensity in the white matter consistent with chronic small vessel ischemic change. Enlarged ventricles but stable. Vascular: No hyperdense vessels.  Carotid vascular calcification Skull: Normal. Negative for fracture or focal lesion. Sinuses/Orbits: Postsurgical changes of the maxillary sinuses. Other: None IMPRESSION: 1. No CT evidence for acute intracranial abnormality. 2. Atrophy and small vessel ischemic changes of the white matter Electronically Signed   By: Donavan Foil M.D.   On: 08/10/2019 23:11      IMPRESSION AND PLAN:   1.  Generalized weakness likely secondary to COVID-19.  The patient will be admitted to a medical monitored isolation bed.  He will be placed on IV hydration with normal saline.  We will continue IV remdesivir for now.  He will be placed on vitamin D3, vitamin C, zinc sulfate, p.o. Pepcid as well as aspirin.  Inflammatory markers came back normal so far and therefore will hold off on IV steroids specially given patient's delirium and hallucinations.  Physical therapy consult to be obtained given inability to ambulate.  2.  Delirium with hallucinations, confusion and combativeness.  Will follow neuro checks every 4 hours for 24 hours.  Will obtain urine drug screen and alcohol level.  His mental status is significant improved during my interview and he was alert and oriented x3 and cooperative.  3.  Hypertension.  We will continue lisinopril and Lopressor.  4.  Coronary artery disease.  We will continue Lopressor, statin therapy and aspirin.  5.  Depression.  We will continue Zoloft.  6.  DVT  prophylaxis.  Subcutaneous Lovenox.  All the records are reviewed and case discussed with ED provider. The plan of care was discussed in details with the patient (and family). I answered all questions. The patient agreed to proceed with the above mentioned plan. Further management will depend upon hospital course.   CODE STATUS: I discussed the CODE STATUS with the patient and he desires to be DNR/DNI.  TOTAL TIME TAKING CARE OF THIS PATIENT: 55 minutes.    Christel Mormon M.D on 08/11/2019 at 3:05 AM  [facility]   From 7 PM-7 AM, contact night-coverage [email or web address]  CC: Primary care physician; Baxter Hire, MD   Note: This dictation was prepared with Dragon dictation along with smaller phrase technology. Any transcriptional errors that result from this process are unintentional.

## 2019-08-11 NOTE — ED Notes (Signed)
Condom catheter placed on pt.

## 2019-08-11 NOTE — ED Notes (Addendum)
Pt's wallet sent home w pt niece [name].

## 2019-08-12 DIAGNOSIS — F329 Major depressive disorder, single episode, unspecified: Secondary | ICD-10-CM | POA: Diagnosis present

## 2019-08-12 DIAGNOSIS — R531 Weakness: Secondary | ICD-10-CM

## 2019-08-12 DIAGNOSIS — F32A Depression, unspecified: Secondary | ICD-10-CM | POA: Diagnosis present

## 2019-08-12 DIAGNOSIS — I10 Essential (primary) hypertension: Secondary | ICD-10-CM

## 2019-08-12 DIAGNOSIS — U071 COVID-19: Secondary | ICD-10-CM | POA: Diagnosis present

## 2019-08-12 LAB — D-DIMER, QUANTITATIVE: D-Dimer, Quant: 1.15 ug/mL-FEU — ABNORMAL HIGH (ref 0.00–0.50)

## 2019-08-12 LAB — COMPREHENSIVE METABOLIC PANEL
ALT: 21 U/L (ref 0–44)
AST: 33 U/L (ref 15–41)
Albumin: 3.2 g/dL — ABNORMAL LOW (ref 3.5–5.0)
Alkaline Phosphatase: 43 U/L (ref 38–126)
Anion gap: 7 (ref 5–15)
BUN: 23 mg/dL (ref 8–23)
CO2: 21 mmol/L — ABNORMAL LOW (ref 22–32)
Calcium: 9 mg/dL (ref 8.9–10.3)
Chloride: 109 mmol/L (ref 98–111)
Creatinine, Ser: 0.99 mg/dL (ref 0.61–1.24)
GFR calc Af Amer: >60 mL/min (ref >60)
GFR calc non Af Amer: >60 mL/min (ref >60)
Glucose, Bld: 91 mg/dL (ref 70–99)
Potassium: 4.6 mmol/L (ref 3.5–5.1)
Sodium: 137 mmol/L (ref 135–145)
Total Bilirubin: 0.7 mg/dL (ref 0.3–1.2)
Total Protein: 5.6 g/dL — ABNORMAL LOW (ref 6.5–8.1)

## 2019-08-12 LAB — CBC WITH DIFFERENTIAL/PLATELET
Abs Immature Granulocytes: 0.01 10*3/uL (ref 0.00–0.07)
Basophils Absolute: 0 10*3/uL (ref 0.0–0.1)
Basophils Relative: 0 %
Eosinophils Absolute: 0 10*3/uL (ref 0.0–0.5)
Eosinophils Relative: 0 %
HCT: 37 % — ABNORMAL LOW (ref 39.0–52.0)
Hemoglobin: 12.6 g/dL — ABNORMAL LOW (ref 13.0–17.0)
Immature Granulocytes: 0 %
Lymphocytes Relative: 20 %
Lymphs Abs: 1.1 10*3/uL (ref 0.7–4.0)
MCH: 31.5 pg (ref 26.0–34.0)
MCHC: 34.1 g/dL (ref 30.0–36.0)
MCV: 92.5 fL (ref 80.0–100.0)
Monocytes Absolute: 0.6 10*3/uL (ref 0.1–1.0)
Monocytes Relative: 10 %
Neutro Abs: 3.9 10*3/uL (ref 1.7–7.7)
Neutrophils Relative %: 70 %
Platelets: 94 10*3/uL — ABNORMAL LOW (ref 150–400)
RBC: 4 MIL/uL — ABNORMAL LOW (ref 4.22–5.81)
RDW: 13.8 % (ref 11.5–15.5)
WBC: 5.6 10*3/uL (ref 4.0–10.5)
nRBC: 0 % (ref 0.0–0.2)

## 2019-08-12 LAB — FERRITIN: Ferritin: 238 ng/mL (ref 24–336)

## 2019-08-12 LAB — C-REACTIVE PROTEIN: CRP: 1.9 mg/dL — ABNORMAL HIGH (ref <1.0)

## 2019-08-12 MED ORDER — SODIUM CHLORIDE 0.9% FLUSH
10.0000 mL | Freq: Two times a day (BID) | INTRAVENOUS | Status: DC
  Administered 2019-08-12 – 2019-08-13 (×2): 10 mL via INTRAVENOUS

## 2019-08-12 NOTE — Plan of Care (Signed)
  Problem: Education: Goal: Knowledge of General Education information will improve Description: Including pain rating scale, medication(s)/side effects and non-pharmacologic comfort measures Outcome: Progressing   Problem: Clinical Measurements: Goal: Respiratory complications will improve Outcome: Progressing Goal: Cardiovascular complication will be avoided Outcome: Progressing   Problem: Activity: Goal: Risk for activity intolerance will decrease Outcome: Progressing   Problem: Respiratory: Goal: Will maintain a patent airway Outcome: Progressing Goal: Complications related to the disease process, condition or treatment will be avoided or minimized Outcome: Progressing

## 2019-08-12 NOTE — Evaluation (Signed)
Physical Therapy Evaluation Patient Details Name: Bradley Nelson MRN: [id number] DOB: [birth year] Today's Date: 08/12/2019   History of Present Illness  Per MD notes: Pt is an 86 y.o. male with a known history of coronary disease status post CABG, hypertension, dyslipidemia and CHF, who presented to the emergency room with acute onset of altered mental status with confusion and combativeness with generalized weakness.  MD assessment includes: Generalized weakness likely secondary to COVID-19, Delirium with hallucinations, confusion and combativeness, HTN, CAD, and depression.    Clinical Impression  Pt presented with deficits in strength, transfers, mobility, gait, balance, and activity tolerance.  Pt pleasant and motivated to participate during the session.  Pt reported feeling weaker than at baseline and did require significant effort getting out of bed and standing from various height surfaces but ultimately did not require physical assistance.  Pt was able to amb 2 x 60' with a RW and cues for amb closer to the walker with upright posture with HR increasing from the 50s at rest to the 60s and with SpO2 remaining >/= 96% throughout.  Pt will benefit from HHPT services upon discharge to safely address above deficits for decreased caregiver assistance and eventual return to PLOF.      Follow Up Recommendations Home health PT;Supervision for mobility/OOB    Equipment Recommendations  Rolling walker with 5" wheels    Recommendations for Other Services       Precautions / Restrictions Precautions Precautions: Fall Restrictions Weight Bearing Restrictions: No      Mobility  Bed Mobility Overal bed mobility: Modified Independent             General bed mobility comments: Extra time and effort and the use of the rail required  Transfers Overall transfer level: Needs assistance Equipment used: Rolling walker (2 wheeled) Transfers: Sit to/from Stand Sit to Stand: Min guard          General transfer comment: Multiple rocking attempts to stand but no physical assistance needed  Ambulation/Gait Ambulation/Gait assistance: Min guard Gait Distance (Feet): 60 Feet x 2 Assistive device: Rolling walker (2 wheeled) Gait Pattern/deviations: Step-through pattern;Decreased step length - right;Decreased step length - left;Trunk flexed Gait velocity: decreased   General Gait Details: Slow cadence and short B step length but steady without LOB  Stairs            Wheelchair Mobility    Modified Rankin (Stroke Patients Only)       Balance Overall balance assessment: Needs assistance   Sitting balance-Leahy Scale: Good     Standing balance support: Bilateral upper extremity supported Standing balance-Leahy Scale: Fair Standing balance comment: Min to mod lean on the RW for support                             Pertinent Vitals/Pain Pain Assessment: No/denies pain    Home Living Family/patient expects to be discharged to:: Private residence Living Arrangements: Alone Available Help at Discharge: Family;Available 24 hours/day Type of Home: House Home Access: Stairs to enter Entrance Stairs-Rails: Left;Right;Can reach both Entrance Stairs-Number of Steps: 3 Home Layout: One level Home Equipment: None Additional Comments: Pt reported that his niece would be available 24/7 if needed    Prior Function Level of Independence: Independent         Comments: Ind amb community distances without an AD, 2 falls in the last 6 months both recent and secondary to "weakness" per pt, Ind with ADLs  Hand Dominance        Extremity/Trunk Assessment   Upper Extremity Assessment Upper Extremity Assessment: Generalized weakness    Lower Extremity Assessment Lower Extremity Assessment: Generalized weakness       Communication   Communication: HOH  Cognition Arousal/Alertness: Awake/alert Behavior During Therapy: WFL for tasks  assessed/performed Overall Cognitive Status: Within Functional Limits for tasks assessed                                        General Comments      Exercises Total Joint Exercises Ankle Circles/Pumps: Strengthening;Both;10 reps;5 reps Quad Sets: Strengthening;Both;10 reps;5 reps Gluteal Sets: Strengthening;Both;10 reps;5 reps Towel Squeeze: Strengthening;Both;10 reps Heel Slides: Strengthening;Both;10 reps Hip ABduction/ADduction: Strengthening;Both;10 reps Straight Leg Raises: Strengthening;Both;10 reps Long Arc Quad: Strengthening;Both;10 reps;15 reps Knee Flexion: Strengthening;Both;10 reps;15 reps Marching in Standing: AROM;Both;10 reps;Standing Other Exercises Other Exercises: HEP education for BLE APs, QS, and GS x 10 each every 1-2 hours daily Other Exercises: Dynamic standing balance training without UE support with reaching outside BOS with fair stabiltiy   Assessment/Plan    PT Assessment Patient needs continued PT services  PT Problem List Decreased strength;Decreased activity tolerance;Decreased balance;Decreased mobility;Decreased knowledge of use of DME       PT Treatment Interventions DME instruction;Gait training;Stair training;Functional mobility training;Therapeutic activities;Therapeutic exercise;Balance training;Patient/family education    PT Goals (Current goals can be found in the Care Plan section)  Acute Rehab PT Goals Patient Stated Goal: To get stronger PT Goal Formulation: With patient Time For Goal Achievement: 08/25/19 Potential to Achieve Goals: Good    Frequency Min 2X/week   Barriers to discharge        Co-evaluation               AM-PAC PT "6 Clicks" Mobility  Outcome Measure Help needed turning from your back to your side while in a flat bed without using bedrails?: A Little Help needed moving from lying on your back to sitting on the side of a flat bed without using bedrails?: A Little Help needed moving to  and from a bed to a chair (including a wheelchair)?: A Little Help needed standing up from a chair using your arms (e.g., wheelchair or bedside chair)?: A Little Help needed to walk in hospital room?: A Little Help needed climbing 3-5 steps with a railing? : A Little 6 Click Score: 18    End of Session Equipment Utilized During Treatment: Gait belt Activity Tolerance: Patient tolerated treatment well Patient left: in chair;with call bell/phone within reach;with chair alarm set Nurse Communication: Mobility status PT Visit Diagnosis: History of falling (Z91.81);Muscle weakness (generalized) (M62.81);Difficulty in walking, not elsewhere classified (R26.2)    Time: [id number] PT Time Calculation (min) (ACUTE ONLY): 55 min   Charges:   PT Evaluation $PT Eval Moderate Complexity: 1 Mod PT Treatments $Gait Training: 8-22 mins $Therapeutic Exercise: 8-22 mins        D. Scott Alyene Predmore PT, DPT 08/12/19, 1:15 PM

## 2019-08-12 NOTE — Plan of Care (Signed)

## 2019-08-12 NOTE — Progress Notes (Signed)
PROGRESS NOTE    Bradley Nelson  [id number] DOB: Oct 05, 1931 DOA: 08/10/2019  PCP: Baxter Hire, MD    LOS - 1   Brief Narrative:  86 y.o. male with a known history of coronary disease status post CABG, hypertension, dyslipidemia and CHF, who presented to the emergency room with acute onset of altered mental status with confusion and combativeness with generalized weakness.  Patient lives alone, had asked his niece to stay with him due to weakness, and she noted the change in mentation and apparent hallucinations.  In the ED, temp 99.2, otherwise normal vitals.  Labs showed a normal CMP and CBC, except for thrombocytopenia with platelet count of 90 7K.  Highly sensitive troponin mildly elevated at 19, repeat 20, however patient free of chest pain and without ischemic changes on EKG.  CRP mildly elevated at 2.4, normal lactic acid, D-dimer mildly elevated 1.27.  UA negative.  Head CT negative for anything acute, showed age-related atrophy and chronic small vessel disease.  Chest x-ray showed no acute findings.  Patient was given 1 L bolus of IV normal saline and started on IV remdesivir.  Admitted to hospitalist service for further evaluation and management.  Subjective 1/4: Patient seen this morning.  No acute events reported overnight.  He reports that he feels good today.  Denies any symptoms including fevers or chills, chest pain, shortness of breath, nausea vomiting or diarrhea.  He is agreeable to work with physical therapy today.  Does state he has been weak lately  Assessment & Plan:   Principal Problem:   COVID-19 virus infection Active Problems:   Generalized weakness   Coronary artery disease   Essential (primary) hypertension   DNR (do not resuscitate)   Depression  Generalized weakness secondary to COVID-19 infection --Fall precautions --PT evaluation -Continue remdesivir per pharmacy protocol -No steroids as no hypoxia -Continue vitamin C, zinc, vitamin  D3 -Continue Pepcid -Continue aspirin -Monitor inflammatory markers daily --Stop IV fluids  Acute encephalopathy -with apparent delirium including confusion, combativeness and hallucinations per family report.  This appears to have resolved --Continue to monitor --Stimulation during the day and quiet with lights off at night  Hypertension -chronic, stable -Continue home lisinopril and Lopressor  Coronary artery disease -no active chest pain -Continue aspirin statin and beta-blocker  Depression -chronic, stable -Continue Zoloft   DVT prophylaxis: Lovenox   Code Status: DNR  Family Communication: None at bedside Disposition Plan: Pending further clinical improvement and PT evaluation.  Expect home versus rehab in 1 to 2 days.   Consultants:   None  Procedures:   None  Antimicrobials:   None   Objective: Vitals:   08/11/19 1832 08/11/19 2043 08/12/19 0433 08/12/19 0812  BP: 133/76 129/80 129/77 118/77  Pulse: 63 62 (!) 54 (!) 50  Resp: 19 17 18 18   Temp: 99 F (37.2 C) 99.7 F (37.6 C) 98.4 F (36.9 C) 98.1 F (36.7 C)  TempSrc: Oral Oral Oral Oral  SpO2: 98% 98% 95% 98%  Weight:      Height:        Intake/Output Summary (Last 24 hours) at 08/12/2019 1433 Last data filed at 08/12/2019 0600 Gross per 24 hour  Intake 872.97 ml  Output 375 ml  Net 497.97 ml   Filed Weights   08/10/19 1527 08/11/19 1828  Weight: 72.6 kg 73.5 kg    Examination:  General exam: awake, alert, no acute distress HEENT: atraumatic, moist mucus membranes, hearing impairment Respiratory system: clear to auscultation  bilaterally, no wheezes, rales or rhonchi, normal respiratory effort. Cardiovascular system: normal S1/S2, RRR, no JVD, murmurs, rubs, gallops, no pedal edema.   Gastrointestinal system: soft, non-tender, non-distended abdomen Central nervous system: alert and oriented x3. no gross focal neurologic deficits, normal speech Extremities: moves all, no edema, normal  tone Skin: dry, intact, normal temperature Psychiatry: normal mood, congruent affect    Data Reviewed: I have personally reviewed following labs and imaging studies  CBC: Recent Labs  Lab 08/10/19 1533 08/12/19 0446  WBC 6.3 5.6  NEUTROABS  --  3.9  HGB 14.1 12.6*  HCT 40.9 37.0*  MCV 93.0 92.5  PLT 97* 94*   Basic Metabolic Panel: Recent Labs  Lab 08/10/19 1533 08/12/19 0446  NA 138 137  K 4.4 4.6  CL 105 109  CO2 22 21*  GLUCOSE 111* 91  BUN 21 23  CREATININE 1.24 0.99  CALCIUM 9.7 9.0   GFR: Estimated Creatinine Clearance: 51 mL/min (by C-G formula based on SCr of 0.99 mg/dL). Liver Function Tests: Recent Labs  Lab 08/10/19 1533 08/12/19 0446  AST 27 33  ALT 17 21  ALKPHOS 54 43  BILITOT 0.9 0.7  PROT 6.9 5.6*  ALBUMIN 4.0 3.2*   No results for input(s): LIPASE, AMYLASE in the last 168 hours. No results for input(s): AMMONIA in the last 168 hours. Coagulation Profile: No results for input(s): INR, PROTIME in the last 168 hours. Cardiac Enzymes: No results for input(s): CKTOTAL, CKMB, CKMBINDEX, TROPONINI in the last 168 hours. BNP (last 3 results) No results for input(s): PROBNP in the last 8760 hours. HbA1C: No results for input(s): HGBA1C in the last 72 hours. CBG: No results for input(s): GLUCAP in the last 168 hours. Lipid Profile: No results for input(s): CHOL, HDL, LDLCALC, TRIG, CHOLHDL, LDLDIRECT in the last 72 hours. Thyroid Function Tests: No results for input(s): TSH, T4TOTAL, FREET4, T3FREE, THYROIDAB in the last 72 hours. Anemia Panel: Recent Labs    08/11/19 0155 08/12/19 0446  FERRITIN 179 238   Sepsis Labs: Recent Labs  Lab 08/10/19 2252 08/10/19 2352  LATICACIDVEN 1.8 1.4    No results found for this or any previous visit (from the past 240 hour(s)).       Radiology Studies: DG Chest 2 View  Result Date: 08/10/2019 CLINICAL DATA:  Shortness of breath, hypotension, increased confusion EXAM: CHEST - 2 VIEW  COMPARISON:  01/16/2016 FINDINGS: Status post median sternotomy and CABG with aortic valve prosthesis. Both lungs are clear. Disc degenerative disease of the thoracic spine. IMPRESSION: No acute abnormality of the lungs. Electronically Signed   By: Eddie Candle M.D.   On: 08/10/2019 17:15   CT Head Wo Contrast  Result Date: 08/10/2019 CLINICAL DATA:  Increased shortness of breath, history of COVID exposure EXAM: CT HEAD WITHOUT CONTRAST TECHNIQUE: Contiguous axial images were obtained from the base of the skull through the vertex without intravenous contrast. COMPARISON:  CT brain 01/16/2016 FINDINGS: Brain: No acute territorial infarction, hemorrhage or intracranial mass. Moderate atrophy. Mild hypodensity in the white matter consistent with chronic small vessel ischemic change. Enlarged ventricles but stable. Vascular: No hyperdense vessels.  Carotid vascular calcification Skull: Normal. Negative for fracture or focal lesion. Sinuses/Orbits: Postsurgical changes of the maxillary sinuses. Other: None IMPRESSION: 1. No CT evidence for acute intracranial abnormality. 2. Atrophy and small vessel ischemic changes of the white matter Electronically Signed   By: Donavan Foil M.D.   On: 08/10/2019 23:11        Scheduled  Meds: . acidophilus  1 capsule Oral Daily  . alfuzosin  10 mg Oral Daily  . vitamin C  500 mg Oral Daily  . aspirin  81 mg Oral Daily  . atorvastatin  40 mg Oral q1800  . cholecalciferol  1,000 Units Oral Daily  . enoxaparin (LOVENOX) injection  40 mg Subcutaneous Q24H  . famotidine  20 mg Oral Daily  . lisinopril  10 mg Oral Daily  . metoprolol tartrate  25 mg Oral BID  . multivitamin with minerals  1 tablet Oral Daily  . sertraline  25 mg Oral Daily  . zinc sulfate  220 mg Oral Daily   Continuous Infusions: . remdesivir 100 mg in NS 100 mL Stopped (08/12/19 1009)     LOS: 1 day    Time spent: 30 minutes    [name], DO [facility]   If 7PM-7AM,  please contact night-coverage [email or web address] Password [facility] 08/12/2019, 2:33 PM

## 2019-08-13 LAB — CBC WITH DIFFERENTIAL/PLATELET
Abs Immature Granulocytes: 0.01 10*3/uL (ref 0.00–0.07)
Basophils Absolute: 0 10*3/uL (ref 0.0–0.1)
Basophils Relative: 0 %
Eosinophils Absolute: 0 10*3/uL (ref 0.0–0.5)
Eosinophils Relative: 1 %
HCT: 40.2 % (ref 39.0–52.0)
Hemoglobin: 13.2 g/dL (ref 13.0–17.0)
Immature Granulocytes: 0 %
Lymphocytes Relative: 20 %
Lymphs Abs: 1.2 10*3/uL (ref 0.7–4.0)
MCH: 31.7 pg (ref 26.0–34.0)
MCHC: 32.8 g/dL (ref 30.0–36.0)
MCV: 96.4 fL (ref 80.0–100.0)
Monocytes Absolute: 0.5 10*3/uL (ref 0.1–1.0)
Monocytes Relative: 9 %
Neutro Abs: 4.1 10*3/uL (ref 1.7–7.7)
Neutrophils Relative %: 70 %
Platelets: 116 10*3/uL — ABNORMAL LOW (ref 150–400)
RBC: 4.17 MIL/uL — ABNORMAL LOW (ref 4.22–5.81)
RDW: 13.5 % (ref 11.5–15.5)
WBC: 5.9 10*3/uL (ref 4.0–10.5)
nRBC: 0 % (ref 0.0–0.2)

## 2019-08-13 LAB — COMPREHENSIVE METABOLIC PANEL
ALT: 21 U/L (ref 0–44)
AST: 33 U/L (ref 15–41)
Albumin: 3.2 g/dL — ABNORMAL LOW (ref 3.5–5.0)
Alkaline Phosphatase: 49 U/L (ref 38–126)
Anion gap: 9 (ref 5–15)
BUN: 23 mg/dL (ref 8–23)
CO2: 23 mmol/L (ref 22–32)
Calcium: 9.1 mg/dL (ref 8.9–10.3)
Chloride: 107 mmol/L (ref 98–111)
Creatinine, Ser: 1.01 mg/dL (ref 0.61–1.24)
GFR calc Af Amer: >60 mL/min (ref >60)
GFR calc non Af Amer: >60 mL/min (ref >60)
Glucose, Bld: 89 mg/dL (ref 70–99)
Potassium: 3.8 mmol/L (ref 3.5–5.1)
Sodium: 139 mmol/L (ref 135–145)
Total Bilirubin: 0.9 mg/dL (ref 0.3–1.2)
Total Protein: 5.7 g/dL — ABNORMAL LOW (ref 6.5–8.1)

## 2019-08-13 LAB — C-REACTIVE PROTEIN: CRP: 1.6 mg/dL — ABNORMAL HIGH (ref <1.0)

## 2019-08-13 LAB — D-DIMER, QUANTITATIVE: D-Dimer, Quant: 1.07 ug/mL-FEU — ABNORMAL HIGH (ref 0.00–0.50)

## 2019-08-13 LAB — FERRITIN: Ferritin: 214 ng/mL (ref 24–336)

## 2019-08-13 NOTE — Progress Notes (Signed)
Discharge instructions explained to pt and pts son, Bradley Nelson...verbalized an understanding/ iv and tele removed/ will transport off unit via wheelchair.

## 2019-08-13 NOTE — Discharge Summary (Signed)
Physician Discharge Summary  Bradley Nelson [id number] DOB: 1932-03-05 DOA: 08/10/2019  PCP: Baxter Hire, MD  Admit date: 08/10/2019 Discharge date: 08/13/2019  Admitted From: Home Disposition:  Home  Recommendations for Outpatient Follow-up:  1. Follow up with PCP in 1-2 weeks 2. Please obtain BMP/CBC in one week   Home Health: Yes   Equipment/Devices: Walker   Discharge Condition: Stable  CODE STATUS: DNR  Diet recommendation: Heart Healthy  Brief/Interim Summary:  86 y.o.malewith a known history of coronary disease status post CABG, hypertension, dyslipidemia and CHF, who presented to the emergency room with acute onset of altered mental status with confusionand combativeness withgeneralized weakness. Patient lives alone, had asked his niece to stay with him due to weakness, and she noted the change in mentation and apparent hallucinations.  In the ED, temp 99.2, otherwise normal vitals.  Labs showed a normal CMP and CBC, except for thrombocytopenia with platelet count of 90 7K.  Highly sensitive troponin mildly elevated at 19, repeat 20, however patient free of chest pain and without ischemic changes on EKG.  CRP mildly elevated at 2.4, normal lactic acid, D-dimer mildly elevated 1.27.  UA negative.  Head CT negative for anything acute, showed age-related atrophy and chronic small vessel disease.  Chest x-ray showed no acute findings.  Patient was given 1 L bolus of IV normal saline and started on IV remdesivir. Admitted to hospitalist service for further evaluation and management, treated with remdesivir, vitamin C, zinc.  Placed on fall precautions initially.  PT evaluated patient and recommended home health PT.  Patient's mental status improved, he has been alert, oriented and without any agitation or combativeness reported.  Patient reported a brief episode of confusion when he woke up thinking he was at home, but quickly realized he was in hospital, realized he was confused.   Patient is clinically improved and stable for discharge home with home health PT and help of his family.   Discharge Diagnoses: Principal Problem:   COVID-19 virus infection Active Problems:   Generalized weakness   Coronary artery disease   Essential (primary) hypertension   DNR (do not resuscitate)   Depression  Generalized weakness secondary to COVID-19 infection --Fall precautions --PT evaluation -Continue remdesivir per pharmacy protocol -No steroids as no hypoxia -Continue vitamin C, zinc, vitamin D3 -Continue Pepcid -Continue aspirin -Monitor inflammatory markers daily --Stop IV fluids  Acute encephalopathy -with apparent delirium including confusion, combativeness and hallucinations per family report.  This appears to have resolved --Continue to monitor --Stimulation during the day and quiet with lights off at night  Hypertension -chronic, stable -Continue home lisinopril and Lopressor  Coronary artery disease -no active chest pain -Continue aspirin statin and beta-blocker  Depression -chronic, stable -Continue Zoloft   Discharge Instructions   Discharge Instructions    Call MD for:   Complete by: As directed    Worsening shortness of breath, worsening overall weakness   Call MD for:  extreme fatigue   Complete by: As directed    Call MD for:  temperature >100.4   Complete by: As directed    Diet - low sodium heart healthy   Complete by: As directed    Increase activity slowly   Complete by: As directed      Allergies as of 08/13/2019      Reactions   Hydrocodone Other (See Comments)   Oxycodone Other (See Comments)      Medication List    STOP taking these medications   amoxicillin  500 MG capsule Commonly known as: AMOXIL     TAKE these medications   Acidophilus 100 MG Caps Take 1 capsule by mouth daily.   alfuzosin 10 MG 24 hr tablet Commonly known as: UROXATRAL Take 10 mg by mouth daily.   aspirin 81 MG chewable tablet Chew 1  tablet by mouth daily.   Centrum Silver tablet Take 1 tablet by mouth daily.   cholecalciferol 25 MCG (1000 UT) tablet Commonly known as: VITAMIN D3 Take 1,000 Units by mouth daily.   Co Q-10 100 MG Caps Take 100 mg by mouth daily.   famotidine 20 MG tablet Commonly known as: PEPCID Take 20 mg by mouth 2 (two) times daily.   lisinopril 10 MG tablet Commonly known as: ZESTRIL Take 10 mg by mouth daily.   metoprolol tartrate 25 MG tablet Commonly known as: LOPRESSOR Take 25 mg by mouth 2 (two) times daily.   sertraline 25 MG tablet Commonly known as: ZOLOFT Take 30 mg by mouth daily.   simvastatin 80 MG tablet Commonly known as: ZOCOR Take 0.5 tablets by mouth every evening.            Durable Medical Equipment  (From admission, onward)         Start     Ordered   08/13/19 1229  For home use only DME Walker rolling  Once    Question:  Patient needs a walker to treat with the following condition  Answer:  Generalized weakness   08/13/19 1228         Follow-up Information    [facility] Follow up.   Specialty: Home Health Services Why: They will follow up with you for home health physical therapy. Contact information: [place] [address] [phone]          Allergies  Allergen Reactions  . Hydrocodone Other (See Comments)  . Oxycodone Other (See Comments)    Consultations:  None    Procedures/Studies: DG Chest 2 View  Result Date: 08/10/2019 CLINICAL DATA:  Shortness of breath, hypotension, increased confusion EXAM: CHEST - 2 VIEW COMPARISON:  01/16/2016 FINDINGS: Status post median sternotomy and CABG with aortic valve prosthesis. Both lungs are clear. Disc degenerative disease of the thoracic spine. IMPRESSION: No acute abnormality of the lungs. Electronically Signed   By: Eddie Candle M.D.   On: 08/10/2019 17:15   CT Head Wo Contrast  Result Date: 08/10/2019 CLINICAL DATA:  Increased shortness of  breath, history of COVID exposure EXAM: CT HEAD WITHOUT CONTRAST TECHNIQUE: Contiguous axial images were obtained from the base of the skull through the vertex without intravenous contrast. COMPARISON:  CT brain 01/16/2016 FINDINGS: Brain: No acute territorial infarction, hemorrhage or intracranial mass. Moderate atrophy. Mild hypodensity in the white matter consistent with chronic small vessel ischemic change. Enlarged ventricles but stable. Vascular: No hyperdense vessels.  Carotid vascular calcification Skull: Normal. Negative for fracture or focal lesion. Sinuses/Orbits: Postsurgical changes of the maxillary sinuses. Other: None IMPRESSION: 1. No CT evidence for acute intracranial abnormality. 2. Atrophy and small vessel ischemic changes of the white matter Electronically Signed   By: Donavan Foil M.D.   On: 08/10/2019 23:11       Subjective: Patient seen this AM.  No acute events reported.  He said he got confused early this morning, woke up thinking he was at home.  Realized where he was quickly.  He denies any fever/chills, shortness of breath, chest pain or other acute complaints.  Discharge Exam: Vitals:   08/13/19 0452 08/13/19 0721  BP: 132/82 130/79  Pulse: (!) 52 (!) 50  Resp: 20 18  Temp: 98 F (36.7 C) 97.7 F (36.5 C)  SpO2: 99% 98%   Vitals:   08/12/19 1656 08/12/19 2024 08/13/19 0452 08/13/19 0721  BP: 133/72 (!) 122/95 132/82 130/79  Pulse: (!) 50 63 (!) 52 (!) 50  Resp: 18  20 18   Temp: 97.6 F (36.4 C) 97.9 F (36.6 C) 98 F (36.7 C) 97.7 F (36.5 C)  TempSrc: Oral Oral Oral Oral  SpO2: 100% 96% 99% 98%  Weight:   73.4 kg   Height:        General: Pt is alert, awake, not in acute distress Cardiovascular: RRR, S1/S2 +, no rubs, no gallops Respiratory: CTA bilaterally, no wheezing, no rhonchi Abdominal: Soft, NT, ND, bowel sounds + Extremities: no edema, no cyanosis    The results of significant diagnostics from this hospitalization (including  imaging, microbiology, ancillary and laboratory) are listed below for reference.     Microbiology: No results found for this or any previous visit (from the past 240 hour(s)).   Labs: BNP (last 3 results) No results for input(s): BNP in the last 8760 hours. Basic Metabolic Panel: Recent Labs  Lab 08/10/19 1533 08/12/19 0446 08/13/19 0651  NA 138 137 139  K 4.4 4.6 3.8  CL 105 109 107  CO2 22 21* 23  GLUCOSE 111* 91 89  BUN 21 23 23   CREATININE 1.24 0.99 1.01  CALCIUM 9.7 9.0 9.1   Liver Function Tests: Recent Labs  Lab 08/10/19 1533 08/12/19 0446 08/13/19 0651  AST 27 33 33  ALT 17 21 21   ALKPHOS 54 43 49  BILITOT 0.9 0.7 0.9  PROT 6.9 5.6* 5.7*  ALBUMIN 4.0 3.2* 3.2*   No results for input(s): LIPASE, AMYLASE in the last 168 hours. No results for input(s): AMMONIA in the last 168 hours. CBC: Recent Labs  Lab 08/10/19 1533 08/12/19 0446 08/13/19 0651  WBC 6.3 5.6 5.9  NEUTROABS  --  3.9 4.1  HGB 14.1 12.6* 13.2  HCT 40.9 37.0* 40.2  MCV 93.0 92.5 96.4  PLT 97* 94* 116*   Cardiac Enzymes: No results for input(s): CKTOTAL, CKMB, CKMBINDEX, TROPONINI in the last 168 hours. BNP: Invalid input(s): POCBNP CBG: No results for input(s): GLUCAP in the last 168 hours. D-Dimer Recent Labs    08/12/19 0446 08/13/19 0651  DDIMER 1.15* 1.07*   Hgb A1c No results for input(s): HGBA1C in the last 72 hours. Lipid Profile No results for input(s): CHOL, HDL, LDLCALC, TRIG, CHOLHDL, LDLDIRECT in the last 72 hours. Thyroid function studies No results for input(s): TSH, T4TOTAL, T3FREE, THYROIDAB in the last 72 hours.  Invalid input(s): FREET3 Anemia work up Recent Labs    08/12/19 0446 08/13/19 0651  FERRITIN 238 214   Urinalysis    Component Value Date/Time   COLORURINE YELLOW (A) 08/10/2019 1108   APPEARANCEUR CLEAR (A) 08/10/2019 1108   APPEARANCEUR Clear 01/21/2016 1031   LABSPEC 1.023 08/10/2019 1108   PHURINE 5.0 08/10/2019 1108   GLUCOSEU  NEGATIVE 08/10/2019 1108   [place] 08/10/2019 1108   [place] 08/10/2019 1108   BILIRUBINUR Negative 01/21/2016 1031   [place] 08/10/2019 1108   PROTEINUR NEGATIVE 08/10/2019 1108   NITRITE NEGATIVE 08/10/2019 1108   LEUKOCYTESUR NEGATIVE 08/10/2019 1108   Sepsis Labs Invalid input(s): PROCALCITONIN,  WBC,  LACTICIDVEN Microbiology No results found for this or any previous  visit (from the past 240 hour(s)).   Time coordinating discharge: Over 30 minutes  SIGNED:   [name], DO [facility] 08/13/2019, 2:49 PM   If 7PM-7AM, please contact night-coverage [email or web address] Password [email or web address]

## 2019-08-13 NOTE — TOC Transition Note (Signed)
Transition of Care [facility]) - CM/SW Discharge Note   Patient Details  Name: Bradley Nelson MRN: [id number] Date of Birth: 1932-05-14  [facility] CM/SW Contact:  Candie Chroman, LCSW Phone Number: 08/13/2019, 12:36 PM   Clinical Narrative: Patient has orders to discharge home today. He was accepted for HHPT by [facility]. CSW ordered rolling walker through [employer]. Son-in-law said he did not already have one at home. Son-in-law said he would pick him up rather than sending him home by ambulance. Son-in-law sent an email stating the following: "My quarantine ends Saturday and I'm feeling 95%, so unless you think differently, I can go to see him in the afternoons, stay overnight, and leave the next morning each day. We'll both wear masks around each other, and this way I can fix his supper and breakfast, make sure he takes his meds, walk him around maybe a bit, and prepare him a lunch if he needs." CSW responded that this would be very helpful. RN will call son-in-law to coordinate time to pick him up once walker has been delivered. No further concerns. CSW signing off.   Final next level of care: [place] Barriers to Discharge: Barriers Resolved   Patient Goals and CMS Choice     Choice offered to / list presented to : Adult Children  Discharge Placement                Patient to be transferred to facility by: Son-in-law will pick him up. Name of family member notified: Ivin Booty and Talmage Coin Patient and family notified of of transfer: 08/13/19  Discharge Plan and Services     Post Acute Care Choice: Home Health          DME Arranged: Gilford Rile rolling DME Agency: [employer] Date DME Agency Contacted: 08/13/19   Representative spoke with at DME Agency: Sunday [place] (Email) [place] Arranged: PT [place] Agency: [place] Date [facility] Agency Contacted: 08/13/19   Representative spoke with at [place]: Adela Lank  Social Determinants of Health (SDOH)  Interventions     Readmission Risk Interventions No flowsheet data found.

## 2019-08-13 NOTE — TOC Initial Note (Signed)
Transition of Care [facility]) - Initial/Assessment Note    Patient Details  Name: Bradley Nelson MRN: [id number] Date of Birth: 1931/09/09  Transition of Care [facility]) CM/SW Contact:    Candie Chroman, LCSW Phone Number: 08/13/2019, 11:45 AM  Clinical Narrative: Patient not oriented to time per RN assessment. CSW tried calling in room but line is busy. CSW called patient's daughter, introduced role, and explained that PT recommendations would be discussed. Patient's daughter concerned about him returning home alone. CSW explained that PT only recommended home health and he has been discharged so insurance would stop paying for him to stay here. Daughter expressed understanding. Discussed potential for private duty services and eventually ALF placement since patient does not like being alone. Patient's daughter and son-in-law are in quarantine with [facility]. Son-in-law's quarantine will be complete on Saturday. His niece's test results are pending. Patient's daughter agreeable to HHPT. No agency preference. Left voicemail for [facility] representative. CSW explained issues with COVID and staffing for many home health agencies so instructed her to call his PCP in 1-2 weeks to see if staffing is any better in the event we cannot get anyone to take him. Patient will need EMS home and son-in-law will meet him at his house. Confirmed address. Daughter and son-in-law live in [place] so it will take him an hour to get there. Will ask RN to call them to coordinate once everything is set up.               Expected Discharge Plan: [place] Barriers to Discharge: Barriers Resolved   Patient Goals and CMS Choice        Expected Discharge Plan and Services Expected Discharge Plan: [place] Choice: [place] arrangements for the past 2 months: Single Family Home Expected Discharge Date: 08/13/19                                     Prior Living Arrangements/Services Living arrangements for the past 2 months: Single Family Home Lives with:: Self Patient language and need for interpreter reviewed:: Yes Do you feel safe going back to the place where you live?: Yes      Need for Family Participation in Patient Care: Yes (Comment) Care giver support system in place?: Yes (comment)   Criminal Activity/Legal Involvement Pertinent to Current Situation/Hospitalization: No - Comment as needed  Activities of Daily Living Home Assistive Devices/Equipment: None ADL Screening (condition at time of admission) Patient's cognitive ability adequate to safely complete daily activities?: Yes Is the patient deaf or have difficulty hearing?: Yes Does the patient have difficulty seeing, even when wearing glasses/contacts?: No Does the patient have difficulty concentrating, remembering, or making decisions?: No Patient able to express need for assistance with ADLs?: Yes Does the patient have difficulty dressing or bathing?: No Independently performs ADLs?: Yes (appropriate for developmental age) Does the patient have difficulty walking or climbing stairs?: Yes Weakness of Legs: Both Weakness of Arms/Hands: None  Permission Sought/Granted Permission sought to share information with : Facility Sport and exercise psychologist, Family Supports    Share Information with NAME: Pecola Lawless  Permission granted to share info w AGENCY: [place] granted to share info w Relationship: Daughter  Permission granted to share info w Contact Information: [phone]  Emotional Assessment Appearance:: Appears stated age Attitude/Demeanor/Rapport: Unable to Assess Affect (  typically observed): Unable to Assess Orientation: : Oriented to Self, Oriented to Place, Oriented to Situation Alcohol / Substance Use: Not Applicable Psych Involvement: No (comment)  Admission diagnosis:  Acute encephalopathy [G93.40] Generalized  weakness [R53.1] COVID-19 [U07.1] Patient Active Problem List   Diagnosis Date Noted  . Generalized weakness 08/12/2019  . COVID-19 virus infection 08/12/2019  . Depression 08/12/2019  . DNR (do not resuscitate) 08/11/2019  . Malignant neoplastic disease ([place]) 04/28/2015  . Hypercholesterolemia 10/15/2014  . Essential (primary) hypertension 06/02/2014  . Acute blood loss anemia 01/25/2013  . Bloody pleural effusion 01/25/2013  . Pain following surgery or procedure 01/25/2013  . H/O aortic valve replacement 01/25/2013  . H/O coronary artery bypass surgery 01/25/2013  . History of open heart surgery 01/25/2013  . Angina, class II ([place]) 01/22/2013  . Heart failure ([place]) 01/22/2013  . Coronary artery disease 01/17/2013  . Aortic heart valve narrowing 01/17/2013   PCP:  Baxter Hire, MD Pharmacy:   [facility], [place] - [address] [place] [address] Phone: [phone] Fax: [place], [place] - [address] [place] [address] Phone: [phone] Fax: [place], [place] - [place] [address] Phone: [phone] Fax: [phone]     Social Determinants of Health (SDOH) Interventions    Readmission Risk Interventions No flowsheet data found.

## 2019-08-14 DIAGNOSIS — U071 COVID-19: Secondary | ICD-10-CM | POA: Diagnosis not present

## 2019-08-17 DIAGNOSIS — I2510 Atherosclerotic heart disease of native coronary artery without angina pectoris: Secondary | ICD-10-CM | POA: Diagnosis not present

## 2019-08-17 DIAGNOSIS — H9190 Unspecified hearing loss, unspecified ear: Secondary | ICD-10-CM | POA: Diagnosis not present

## 2019-08-17 DIAGNOSIS — I509 Heart failure, unspecified: Secondary | ICD-10-CM | POA: Diagnosis not present

## 2019-08-17 DIAGNOSIS — U071 COVID-19: Secondary | ICD-10-CM | POA: Diagnosis not present

## 2019-08-17 DIAGNOSIS — Z9181 History of falling: Secondary | ICD-10-CM | POA: Diagnosis not present

## 2019-08-17 DIAGNOSIS — I110 Hypertensive heart disease with heart failure: Secondary | ICD-10-CM | POA: Diagnosis not present

## 2019-08-17 DIAGNOSIS — D696 Thrombocytopenia, unspecified: Secondary | ICD-10-CM | POA: Diagnosis not present

## 2019-08-17 DIAGNOSIS — G9340 Encephalopathy, unspecified: Secondary | ICD-10-CM | POA: Diagnosis not present

## 2019-08-17 DIAGNOSIS — I959 Hypotension, unspecified: Secondary | ICD-10-CM | POA: Diagnosis not present

## 2019-08-17 DIAGNOSIS — E785 Hyperlipidemia, unspecified: Secondary | ICD-10-CM | POA: Diagnosis not present

## 2019-08-17 DIAGNOSIS — Z951 Presence of aortocoronary bypass graft: Secondary | ICD-10-CM | POA: Diagnosis not present

## 2019-08-17 DIAGNOSIS — N400 Enlarged prostate without lower urinary tract symptoms: Secondary | ICD-10-CM | POA: Diagnosis not present

## 2019-08-17 DIAGNOSIS — G319 Degenerative disease of nervous system, unspecified: Secondary | ICD-10-CM | POA: Diagnosis not present

## 2019-08-17 DIAGNOSIS — M5134 Other intervertebral disc degeneration, thoracic region: Secondary | ICD-10-CM | POA: Diagnosis not present

## 2019-08-17 DIAGNOSIS — Z7982 Long term (current) use of aspirin: Secondary | ICD-10-CM | POA: Diagnosis not present

## 2019-08-17 DIAGNOSIS — F329 Major depressive disorder, single episode, unspecified: Secondary | ICD-10-CM | POA: Diagnosis not present

## 2019-08-24 DIAGNOSIS — Z951 Presence of aortocoronary bypass graft: Secondary | ICD-10-CM | POA: Diagnosis not present

## 2019-08-24 DIAGNOSIS — E785 Hyperlipidemia, unspecified: Secondary | ICD-10-CM | POA: Diagnosis not present

## 2019-08-24 DIAGNOSIS — U071 COVID-19: Secondary | ICD-10-CM | POA: Diagnosis not present

## 2019-08-24 DIAGNOSIS — N400 Enlarged prostate without lower urinary tract symptoms: Secondary | ICD-10-CM | POA: Diagnosis not present

## 2019-08-24 DIAGNOSIS — I110 Hypertensive heart disease with heart failure: Secondary | ICD-10-CM | POA: Diagnosis not present

## 2019-08-24 DIAGNOSIS — D696 Thrombocytopenia, unspecified: Secondary | ICD-10-CM | POA: Diagnosis not present

## 2019-08-24 DIAGNOSIS — Z9181 History of falling: Secondary | ICD-10-CM | POA: Diagnosis not present

## 2019-08-24 DIAGNOSIS — I959 Hypotension, unspecified: Secondary | ICD-10-CM | POA: Diagnosis not present

## 2019-08-24 DIAGNOSIS — M5134 Other intervertebral disc degeneration, thoracic region: Secondary | ICD-10-CM | POA: Diagnosis not present

## 2019-08-24 DIAGNOSIS — I509 Heart failure, unspecified: Secondary | ICD-10-CM | POA: Diagnosis not present

## 2019-08-24 DIAGNOSIS — F329 Major depressive disorder, single episode, unspecified: Secondary | ICD-10-CM | POA: Diagnosis not present

## 2019-08-24 DIAGNOSIS — I2510 Atherosclerotic heart disease of native coronary artery without angina pectoris: Secondary | ICD-10-CM | POA: Diagnosis not present

## 2019-08-24 DIAGNOSIS — Z7982 Long term (current) use of aspirin: Secondary | ICD-10-CM | POA: Diagnosis not present

## 2019-08-24 DIAGNOSIS — G319 Degenerative disease of nervous system, unspecified: Secondary | ICD-10-CM | POA: Diagnosis not present

## 2019-08-24 DIAGNOSIS — G9340 Encephalopathy, unspecified: Secondary | ICD-10-CM | POA: Diagnosis not present

## 2019-08-24 DIAGNOSIS — H9190 Unspecified hearing loss, unspecified ear: Secondary | ICD-10-CM | POA: Diagnosis not present

## 2019-08-27 DIAGNOSIS — U071 COVID-19: Secondary | ICD-10-CM | POA: Diagnosis not present

## 2019-08-27 DIAGNOSIS — I10 Essential (primary) hypertension: Secondary | ICD-10-CM | POA: Diagnosis not present

## 2019-08-27 DIAGNOSIS — I2510 Atherosclerotic heart disease of native coronary artery without angina pectoris: Secondary | ICD-10-CM | POA: Diagnosis not present

## 2019-08-27 DIAGNOSIS — Z0000 Encounter for general adult medical examination without abnormal findings: Secondary | ICD-10-CM | POA: Diagnosis not present

## 2019-08-27 DIAGNOSIS — F329 Major depressive disorder, single episode, unspecified: Secondary | ICD-10-CM | POA: Diagnosis not present

## 2019-08-27 DIAGNOSIS — I5020 Unspecified systolic (congestive) heart failure: Secondary | ICD-10-CM | POA: Diagnosis not present

## 2019-08-27 DIAGNOSIS — E7800 Pure hypercholesterolemia, unspecified: Secondary | ICD-10-CM | POA: Diagnosis not present

## 2019-08-27 DIAGNOSIS — D696 Thrombocytopenia, unspecified: Secondary | ICD-10-CM | POA: Diagnosis not present

## 2019-09-02 DIAGNOSIS — H6123 Impacted cerumen, bilateral: Secondary | ICD-10-CM | POA: Diagnosis not present

## 2019-09-02 DIAGNOSIS — H903 Sensorineural hearing loss, bilateral: Secondary | ICD-10-CM | POA: Diagnosis not present

## 2019-09-10 DIAGNOSIS — Z951 Presence of aortocoronary bypass graft: Secondary | ICD-10-CM | POA: Diagnosis not present

## 2019-09-10 DIAGNOSIS — E785 Hyperlipidemia, unspecified: Secondary | ICD-10-CM | POA: Diagnosis not present

## 2019-09-10 DIAGNOSIS — I509 Heart failure, unspecified: Secondary | ICD-10-CM | POA: Diagnosis not present

## 2019-09-10 DIAGNOSIS — N400 Enlarged prostate without lower urinary tract symptoms: Secondary | ICD-10-CM | POA: Diagnosis not present

## 2019-09-10 DIAGNOSIS — I110 Hypertensive heart disease with heart failure: Secondary | ICD-10-CM | POA: Diagnosis not present

## 2019-09-10 DIAGNOSIS — M5134 Other intervertebral disc degeneration, thoracic region: Secondary | ICD-10-CM | POA: Diagnosis not present

## 2019-09-10 DIAGNOSIS — F329 Major depressive disorder, single episode, unspecified: Secondary | ICD-10-CM | POA: Diagnosis not present

## 2019-09-10 DIAGNOSIS — U071 COVID-19: Secondary | ICD-10-CM | POA: Diagnosis not present

## 2019-09-10 DIAGNOSIS — I959 Hypotension, unspecified: Secondary | ICD-10-CM | POA: Diagnosis not present

## 2019-09-10 DIAGNOSIS — I2510 Atherosclerotic heart disease of native coronary artery without angina pectoris: Secondary | ICD-10-CM | POA: Diagnosis not present

## 2019-09-10 DIAGNOSIS — H9190 Unspecified hearing loss, unspecified ear: Secondary | ICD-10-CM | POA: Diagnosis not present

## 2019-09-10 DIAGNOSIS — D696 Thrombocytopenia, unspecified: Secondary | ICD-10-CM | POA: Diagnosis not present

## 2019-09-10 DIAGNOSIS — Z7982 Long term (current) use of aspirin: Secondary | ICD-10-CM | POA: Diagnosis not present

## 2019-09-10 DIAGNOSIS — G9340 Encephalopathy, unspecified: Secondary | ICD-10-CM | POA: Diagnosis not present

## 2019-09-10 DIAGNOSIS — Z9181 History of falling: Secondary | ICD-10-CM | POA: Diagnosis not present

## 2019-09-10 DIAGNOSIS — G319 Degenerative disease of nervous system, unspecified: Secondary | ICD-10-CM | POA: Diagnosis not present

## 2019-09-25 DIAGNOSIS — I509 Heart failure, unspecified: Secondary | ICD-10-CM | POA: Diagnosis not present

## 2019-09-25 DIAGNOSIS — E785 Hyperlipidemia, unspecified: Secondary | ICD-10-CM | POA: Diagnosis not present

## 2019-09-25 DIAGNOSIS — F329 Major depressive disorder, single episode, unspecified: Secondary | ICD-10-CM | POA: Diagnosis not present

## 2019-09-25 DIAGNOSIS — D696 Thrombocytopenia, unspecified: Secondary | ICD-10-CM | POA: Diagnosis not present

## 2019-09-25 DIAGNOSIS — Z951 Presence of aortocoronary bypass graft: Secondary | ICD-10-CM | POA: Diagnosis not present

## 2019-09-25 DIAGNOSIS — M5134 Other intervertebral disc degeneration, thoracic region: Secondary | ICD-10-CM | POA: Diagnosis not present

## 2019-09-25 DIAGNOSIS — N400 Enlarged prostate without lower urinary tract symptoms: Secondary | ICD-10-CM | POA: Diagnosis not present

## 2019-09-25 DIAGNOSIS — I110 Hypertensive heart disease with heart failure: Secondary | ICD-10-CM | POA: Diagnosis not present

## 2019-09-25 DIAGNOSIS — H9190 Unspecified hearing loss, unspecified ear: Secondary | ICD-10-CM | POA: Diagnosis not present

## 2019-09-25 DIAGNOSIS — U071 COVID-19: Secondary | ICD-10-CM | POA: Diagnosis not present

## 2019-09-25 DIAGNOSIS — G319 Degenerative disease of nervous system, unspecified: Secondary | ICD-10-CM | POA: Diagnosis not present

## 2019-09-25 DIAGNOSIS — G9340 Encephalopathy, unspecified: Secondary | ICD-10-CM | POA: Diagnosis not present

## 2019-09-25 DIAGNOSIS — I2510 Atherosclerotic heart disease of native coronary artery without angina pectoris: Secondary | ICD-10-CM | POA: Diagnosis not present

## 2019-09-25 DIAGNOSIS — I959 Hypotension, unspecified: Secondary | ICD-10-CM | POA: Diagnosis not present

## 2019-09-25 DIAGNOSIS — Z9181 History of falling: Secondary | ICD-10-CM | POA: Diagnosis not present

## 2019-09-25 DIAGNOSIS — Z7982 Long term (current) use of aspirin: Secondary | ICD-10-CM | POA: Diagnosis not present

## 2019-10-02 DIAGNOSIS — U071 COVID-19: Secondary | ICD-10-CM | POA: Diagnosis not present

## 2019-10-02 DIAGNOSIS — D696 Thrombocytopenia, unspecified: Secondary | ICD-10-CM | POA: Diagnosis not present

## 2019-10-02 DIAGNOSIS — I509 Heart failure, unspecified: Secondary | ICD-10-CM | POA: Diagnosis not present

## 2019-10-02 DIAGNOSIS — I959 Hypotension, unspecified: Secondary | ICD-10-CM | POA: Diagnosis not present

## 2019-10-02 DIAGNOSIS — E785 Hyperlipidemia, unspecified: Secondary | ICD-10-CM | POA: Diagnosis not present

## 2019-10-02 DIAGNOSIS — H9190 Unspecified hearing loss, unspecified ear: Secondary | ICD-10-CM | POA: Diagnosis not present

## 2019-10-02 DIAGNOSIS — I2510 Atherosclerotic heart disease of native coronary artery without angina pectoris: Secondary | ICD-10-CM | POA: Diagnosis not present

## 2019-10-02 DIAGNOSIS — Z9181 History of falling: Secondary | ICD-10-CM | POA: Diagnosis not present

## 2019-10-02 DIAGNOSIS — M5134 Other intervertebral disc degeneration, thoracic region: Secondary | ICD-10-CM | POA: Diagnosis not present

## 2019-10-02 DIAGNOSIS — G9340 Encephalopathy, unspecified: Secondary | ICD-10-CM | POA: Diagnosis not present

## 2019-10-02 DIAGNOSIS — Z7982 Long term (current) use of aspirin: Secondary | ICD-10-CM | POA: Diagnosis not present

## 2019-10-02 DIAGNOSIS — Z951 Presence of aortocoronary bypass graft: Secondary | ICD-10-CM | POA: Diagnosis not present

## 2019-10-02 DIAGNOSIS — I110 Hypertensive heart disease with heart failure: Secondary | ICD-10-CM | POA: Diagnosis not present

## 2019-10-02 DIAGNOSIS — F329 Major depressive disorder, single episode, unspecified: Secondary | ICD-10-CM | POA: Diagnosis not present

## 2019-10-02 DIAGNOSIS — G319 Degenerative disease of nervous system, unspecified: Secondary | ICD-10-CM | POA: Diagnosis not present

## 2019-10-02 DIAGNOSIS — N400 Enlarged prostate without lower urinary tract symptoms: Secondary | ICD-10-CM | POA: Diagnosis not present

## 2019-10-14 DIAGNOSIS — Z953 Presence of xenogenic heart valve: Secondary | ICD-10-CM | POA: Diagnosis not present

## 2019-10-14 DIAGNOSIS — Z9889 Other specified postprocedural states: Secondary | ICD-10-CM | POA: Diagnosis not present

## 2019-10-14 DIAGNOSIS — E7800 Pure hypercholesterolemia, unspecified: Secondary | ICD-10-CM | POA: Diagnosis not present

## 2019-10-14 DIAGNOSIS — U071 COVID-19: Secondary | ICD-10-CM | POA: Diagnosis not present

## 2019-10-14 DIAGNOSIS — I5020 Unspecified systolic (congestive) heart failure: Secondary | ICD-10-CM | POA: Diagnosis not present

## 2019-10-14 DIAGNOSIS — I10 Essential (primary) hypertension: Secondary | ICD-10-CM | POA: Diagnosis not present

## 2019-10-14 DIAGNOSIS — I350 Nonrheumatic aortic (valve) stenosis: Secondary | ICD-10-CM | POA: Diagnosis not present

## 2019-10-14 DIAGNOSIS — I209 Angina pectoris, unspecified: Secondary | ICD-10-CM | POA: Diagnosis not present

## 2019-10-14 DIAGNOSIS — I340 Nonrheumatic mitral (valve) insufficiency: Secondary | ICD-10-CM | POA: Diagnosis not present

## 2019-10-14 DIAGNOSIS — I2510 Atherosclerotic heart disease of native coronary artery without angina pectoris: Secondary | ICD-10-CM | POA: Diagnosis not present

## 2019-10-14 DIAGNOSIS — Z951 Presence of aortocoronary bypass graft: Secondary | ICD-10-CM | POA: Diagnosis not present

## 2019-10-18 DIAGNOSIS — L0390 Cellulitis, unspecified: Secondary | ICD-10-CM | POA: Diagnosis not present

## 2019-10-18 DIAGNOSIS — T7840XA Allergy, unspecified, initial encounter: Secondary | ICD-10-CM | POA: Diagnosis not present

## 2019-10-23 DIAGNOSIS — T7840XA Allergy, unspecified, initial encounter: Secondary | ICD-10-CM | POA: Diagnosis not present

## 2019-11-11 DIAGNOSIS — I10 Essential (primary) hypertension: Secondary | ICD-10-CM | POA: Diagnosis not present

## 2019-11-11 DIAGNOSIS — L2084 Intrinsic (allergic) eczema: Secondary | ICD-10-CM | POA: Diagnosis not present

## 2019-11-11 DIAGNOSIS — I5020 Unspecified systolic (congestive) heart failure: Secondary | ICD-10-CM | POA: Diagnosis not present

## 2019-11-11 DIAGNOSIS — R21 Rash and other nonspecific skin eruption: Secondary | ICD-10-CM | POA: Diagnosis not present

## 2019-11-22 DIAGNOSIS — I5020 Unspecified systolic (congestive) heart failure: Secondary | ICD-10-CM | POA: Diagnosis not present

## 2019-11-22 DIAGNOSIS — I10 Essential (primary) hypertension: Secondary | ICD-10-CM | POA: Diagnosis not present

## 2019-11-22 DIAGNOSIS — R21 Rash and other nonspecific skin eruption: Secondary | ICD-10-CM | POA: Diagnosis not present

## 2019-11-28 DIAGNOSIS — I2510 Atherosclerotic heart disease of native coronary artery without angina pectoris: Secondary | ICD-10-CM | POA: Diagnosis not present

## 2019-11-28 DIAGNOSIS — I5020 Unspecified systolic (congestive) heart failure: Secondary | ICD-10-CM | POA: Diagnosis not present

## 2019-11-28 DIAGNOSIS — F329 Major depressive disorder, single episode, unspecified: Secondary | ICD-10-CM | POA: Diagnosis not present

## 2019-11-28 DIAGNOSIS — R410 Disorientation, unspecified: Secondary | ICD-10-CM | POA: Diagnosis not present

## 2019-11-28 DIAGNOSIS — E7800 Pure hypercholesterolemia, unspecified: Secondary | ICD-10-CM | POA: Diagnosis not present

## 2019-11-28 DIAGNOSIS — D696 Thrombocytopenia, unspecified: Secondary | ICD-10-CM | POA: Diagnosis not present

## 2019-11-28 DIAGNOSIS — L959 Vasculitis limited to the skin, unspecified: Secondary | ICD-10-CM | POA: Diagnosis not present

## 2019-11-28 DIAGNOSIS — I10 Essential (primary) hypertension: Secondary | ICD-10-CM | POA: Diagnosis not present

## 2019-11-28 DIAGNOSIS — R21 Rash and other nonspecific skin eruption: Secondary | ICD-10-CM | POA: Diagnosis not present

## 2019-12-02 DIAGNOSIS — R0602 Shortness of breath: Secondary | ICD-10-CM | POA: Diagnosis not present

## 2019-12-02 DIAGNOSIS — R21 Rash and other nonspecific skin eruption: Secondary | ICD-10-CM | POA: Diagnosis not present

## 2019-12-02 DIAGNOSIS — D693 Immune thrombocytopenic purpura: Secondary | ICD-10-CM | POA: Diagnosis not present

## 2019-12-02 DIAGNOSIS — L519 Erythema multiforme, unspecified: Secondary | ICD-10-CM | POA: Diagnosis not present

## 2019-12-03 DIAGNOSIS — L309 Dermatitis, unspecified: Secondary | ICD-10-CM | POA: Diagnosis not present

## 2019-12-03 DIAGNOSIS — L308 Other specified dermatitis: Secondary | ICD-10-CM | POA: Diagnosis not present

## 2019-12-13 DIAGNOSIS — D696 Thrombocytopenia, unspecified: Secondary | ICD-10-CM | POA: Diagnosis not present

## 2019-12-13 DIAGNOSIS — F329 Major depressive disorder, single episode, unspecified: Secondary | ICD-10-CM | POA: Diagnosis not present

## 2019-12-13 DIAGNOSIS — E7800 Pure hypercholesterolemia, unspecified: Secondary | ICD-10-CM | POA: Diagnosis not present

## 2019-12-13 DIAGNOSIS — I2510 Atherosclerotic heart disease of native coronary artery without angina pectoris: Secondary | ICD-10-CM | POA: Diagnosis not present

## 2019-12-13 DIAGNOSIS — L409 Psoriasis, unspecified: Secondary | ICD-10-CM | POA: Diagnosis not present

## 2019-12-13 DIAGNOSIS — I10 Essential (primary) hypertension: Secondary | ICD-10-CM | POA: Diagnosis not present

## 2019-12-13 DIAGNOSIS — I5020 Unspecified systolic (congestive) heart failure: Secondary | ICD-10-CM | POA: Diagnosis not present

## 2019-12-13 DIAGNOSIS — C801 Malignant (primary) neoplasm, unspecified: Secondary | ICD-10-CM | POA: Diagnosis not present

## 2019-12-13 DIAGNOSIS — I209 Angina pectoris, unspecified: Secondary | ICD-10-CM | POA: Diagnosis not present

## 2020-01-09 DIAGNOSIS — E519 Thiamine deficiency, unspecified: Secondary | ICD-10-CM | POA: Diagnosis not present

## 2020-01-09 DIAGNOSIS — G309 Alzheimer's disease, unspecified: Secondary | ICD-10-CM | POA: Diagnosis not present

## 2020-01-09 DIAGNOSIS — E538 Deficiency of other specified B group vitamins: Secondary | ICD-10-CM | POA: Diagnosis not present

## 2020-01-09 DIAGNOSIS — R2689 Other abnormalities of gait and mobility: Secondary | ICD-10-CM | POA: Diagnosis not present

## 2020-01-09 DIAGNOSIS — F0280 Dementia in other diseases classified elsewhere without behavioral disturbance: Secondary | ICD-10-CM | POA: Diagnosis not present

## 2020-01-09 DIAGNOSIS — R413 Other amnesia: Secondary | ICD-10-CM | POA: Diagnosis not present

## 2020-01-09 DIAGNOSIS — F0150 Vascular dementia without behavioral disturbance: Secondary | ICD-10-CM | POA: Diagnosis not present

## 2020-01-10 ENCOUNTER — Other Ambulatory Visit: Payer: Self-pay | Admitting: Neurology

## 2020-01-10 DIAGNOSIS — R413 Other amnesia: Secondary | ICD-10-CM

## 2020-01-15 DIAGNOSIS — E7800 Pure hypercholesterolemia, unspecified: Secondary | ICD-10-CM | POA: Diagnosis not present

## 2020-01-15 DIAGNOSIS — Z952 Presence of prosthetic heart valve: Secondary | ICD-10-CM | POA: Diagnosis not present

## 2020-01-15 DIAGNOSIS — Z79899 Other long term (current) drug therapy: Secondary | ICD-10-CM | POA: Diagnosis not present

## 2020-01-15 DIAGNOSIS — I110 Hypertensive heart disease with heart failure: Secondary | ICD-10-CM | POA: Diagnosis not present

## 2020-01-15 DIAGNOSIS — Z955 Presence of coronary angioplasty implant and graft: Secondary | ICD-10-CM | POA: Diagnosis not present

## 2020-01-15 DIAGNOSIS — Z951 Presence of aortocoronary bypass graft: Secondary | ICD-10-CM | POA: Diagnosis not present

## 2020-01-15 DIAGNOSIS — Z9181 History of falling: Secondary | ICD-10-CM | POA: Diagnosis not present

## 2020-01-15 DIAGNOSIS — I501 Left ventricular failure: Secondary | ICD-10-CM | POA: Diagnosis not present

## 2020-01-15 DIAGNOSIS — F0390 Unspecified dementia without behavioral disturbance: Secondary | ICD-10-CM | POA: Diagnosis not present

## 2020-01-15 DIAGNOSIS — I509 Heart failure, unspecified: Secondary | ICD-10-CM | POA: Diagnosis not present

## 2020-01-15 DIAGNOSIS — Z8546 Personal history of malignant neoplasm of prostate: Secondary | ICD-10-CM | POA: Diagnosis not present

## 2020-01-15 DIAGNOSIS — Z7982 Long term (current) use of aspirin: Secondary | ICD-10-CM | POA: Diagnosis not present

## 2020-01-15 DIAGNOSIS — Z87891 Personal history of nicotine dependence: Secondary | ICD-10-CM | POA: Diagnosis not present

## 2020-01-15 DIAGNOSIS — I2510 Atherosclerotic heart disease of native coronary artery without angina pectoris: Secondary | ICD-10-CM | POA: Diagnosis not present

## 2020-01-15 DIAGNOSIS — F329 Major depressive disorder, single episode, unspecified: Secondary | ICD-10-CM | POA: Diagnosis not present

## 2020-01-15 DIAGNOSIS — Z8616 Personal history of COVID-19: Secondary | ICD-10-CM | POA: Diagnosis not present

## 2020-01-23 ENCOUNTER — Ambulatory Visit
Admission: RE | Admit: 2020-01-23 | Discharge: 2020-01-23 | Disposition: A | Discharge status: Home / Self Care | Payer: PPO | Source: Ambulatory Visit | Attending: Neurology | Admitting: Neurology

## 2020-01-23 ENCOUNTER — Other Ambulatory Visit: Payer: Self-pay

## 2020-01-23 DIAGNOSIS — R413 Other amnesia: Secondary | ICD-10-CM | POA: Diagnosis not present

## 2020-01-23 DIAGNOSIS — F329 Major depressive disorder, single episode, unspecified: Secondary | ICD-10-CM | POA: Diagnosis not present

## 2020-01-23 DIAGNOSIS — Z955 Presence of coronary angioplasty implant and graft: Secondary | ICD-10-CM | POA: Diagnosis not present

## 2020-01-23 DIAGNOSIS — F0390 Unspecified dementia without behavioral disturbance: Secondary | ICD-10-CM | POA: Diagnosis not present

## 2020-01-23 DIAGNOSIS — Z7982 Long term (current) use of aspirin: Secondary | ICD-10-CM | POA: Diagnosis not present

## 2020-01-23 DIAGNOSIS — Z952 Presence of prosthetic heart valve: Secondary | ICD-10-CM | POA: Diagnosis not present

## 2020-01-23 DIAGNOSIS — Z8546 Personal history of malignant neoplasm of prostate: Secondary | ICD-10-CM | POA: Diagnosis not present

## 2020-01-23 DIAGNOSIS — Z87891 Personal history of nicotine dependence: Secondary | ICD-10-CM | POA: Diagnosis not present

## 2020-01-23 DIAGNOSIS — I2510 Atherosclerotic heart disease of native coronary artery without angina pectoris: Secondary | ICD-10-CM | POA: Diagnosis not present

## 2020-01-23 DIAGNOSIS — Z9181 History of falling: Secondary | ICD-10-CM | POA: Diagnosis not present

## 2020-01-23 DIAGNOSIS — I501 Left ventricular failure: Secondary | ICD-10-CM | POA: Diagnosis not present

## 2020-01-23 DIAGNOSIS — Z951 Presence of aortocoronary bypass graft: Secondary | ICD-10-CM | POA: Diagnosis not present

## 2020-01-23 DIAGNOSIS — Z8616 Personal history of COVID-19: Secondary | ICD-10-CM | POA: Diagnosis not present

## 2020-01-23 DIAGNOSIS — I110 Hypertensive heart disease with heart failure: Secondary | ICD-10-CM | POA: Diagnosis not present

## 2020-01-23 DIAGNOSIS — E7800 Pure hypercholesterolemia, unspecified: Secondary | ICD-10-CM | POA: Diagnosis not present

## 2020-01-23 DIAGNOSIS — I509 Heart failure, unspecified: Secondary | ICD-10-CM | POA: Diagnosis not present

## 2020-01-27 DIAGNOSIS — I110 Hypertensive heart disease with heart failure: Secondary | ICD-10-CM | POA: Diagnosis not present

## 2020-01-27 DIAGNOSIS — Z952 Presence of prosthetic heart valve: Secondary | ICD-10-CM | POA: Diagnosis not present

## 2020-01-27 DIAGNOSIS — Z955 Presence of coronary angioplasty implant and graft: Secondary | ICD-10-CM | POA: Diagnosis not present

## 2020-01-27 DIAGNOSIS — Z9181 History of falling: Secondary | ICD-10-CM | POA: Diagnosis not present

## 2020-01-27 DIAGNOSIS — I509 Heart failure, unspecified: Secondary | ICD-10-CM | POA: Diagnosis not present

## 2020-01-27 DIAGNOSIS — E7800 Pure hypercholesterolemia, unspecified: Secondary | ICD-10-CM | POA: Diagnosis not present

## 2020-01-27 DIAGNOSIS — I2510 Atherosclerotic heart disease of native coronary artery without angina pectoris: Secondary | ICD-10-CM | POA: Diagnosis not present

## 2020-01-27 DIAGNOSIS — Z951 Presence of aortocoronary bypass graft: Secondary | ICD-10-CM | POA: Diagnosis not present

## 2020-01-27 DIAGNOSIS — Z7982 Long term (current) use of aspirin: Secondary | ICD-10-CM | POA: Diagnosis not present

## 2020-01-27 DIAGNOSIS — Z8546 Personal history of malignant neoplasm of prostate: Secondary | ICD-10-CM | POA: Diagnosis not present

## 2020-01-27 DIAGNOSIS — I501 Left ventricular failure: Secondary | ICD-10-CM | POA: Diagnosis not present

## 2020-01-27 DIAGNOSIS — F329 Major depressive disorder, single episode, unspecified: Secondary | ICD-10-CM | POA: Diagnosis not present

## 2020-01-27 DIAGNOSIS — Z87891 Personal history of nicotine dependence: Secondary | ICD-10-CM | POA: Diagnosis not present

## 2020-01-27 DIAGNOSIS — Z8616 Personal history of COVID-19: Secondary | ICD-10-CM | POA: Diagnosis not present

## 2020-01-27 DIAGNOSIS — F0390 Unspecified dementia without behavioral disturbance: Secondary | ICD-10-CM | POA: Diagnosis not present

## 2020-01-30 DIAGNOSIS — F0150 Vascular dementia without behavioral disturbance: Secondary | ICD-10-CM | POA: Diagnosis not present

## 2020-02-05 DIAGNOSIS — Z7982 Long term (current) use of aspirin: Secondary | ICD-10-CM | POA: Diagnosis not present

## 2020-02-05 DIAGNOSIS — Z8546 Personal history of malignant neoplasm of prostate: Secondary | ICD-10-CM | POA: Diagnosis not present

## 2020-02-05 DIAGNOSIS — F329 Major depressive disorder, single episode, unspecified: Secondary | ICD-10-CM | POA: Diagnosis not present

## 2020-02-05 DIAGNOSIS — Z951 Presence of aortocoronary bypass graft: Secondary | ICD-10-CM | POA: Diagnosis not present

## 2020-02-05 DIAGNOSIS — I509 Heart failure, unspecified: Secondary | ICD-10-CM | POA: Diagnosis not present

## 2020-02-05 DIAGNOSIS — E7800 Pure hypercholesterolemia, unspecified: Secondary | ICD-10-CM | POA: Diagnosis not present

## 2020-02-05 DIAGNOSIS — I2510 Atherosclerotic heart disease of native coronary artery without angina pectoris: Secondary | ICD-10-CM | POA: Diagnosis not present

## 2020-02-05 DIAGNOSIS — F0390 Unspecified dementia without behavioral disturbance: Secondary | ICD-10-CM | POA: Diagnosis not present

## 2020-02-05 DIAGNOSIS — Z955 Presence of coronary angioplasty implant and graft: Secondary | ICD-10-CM | POA: Diagnosis not present

## 2020-02-05 DIAGNOSIS — I501 Left ventricular failure: Secondary | ICD-10-CM | POA: Diagnosis not present

## 2020-02-05 DIAGNOSIS — I110 Hypertensive heart disease with heart failure: Secondary | ICD-10-CM | POA: Diagnosis not present

## 2020-02-05 DIAGNOSIS — Z8616 Personal history of COVID-19: Secondary | ICD-10-CM | POA: Diagnosis not present

## 2020-02-05 DIAGNOSIS — Z87891 Personal history of nicotine dependence: Secondary | ICD-10-CM | POA: Diagnosis not present

## 2020-02-05 DIAGNOSIS — Z9181 History of falling: Secondary | ICD-10-CM | POA: Diagnosis not present

## 2020-02-05 DIAGNOSIS — Z952 Presence of prosthetic heart valve: Secondary | ICD-10-CM | POA: Diagnosis not present

## 2020-02-11 DIAGNOSIS — Z8616 Personal history of COVID-19: Secondary | ICD-10-CM | POA: Diagnosis not present

## 2020-02-11 DIAGNOSIS — Z952 Presence of prosthetic heart valve: Secondary | ICD-10-CM | POA: Diagnosis not present

## 2020-02-11 DIAGNOSIS — Z7982 Long term (current) use of aspirin: Secondary | ICD-10-CM | POA: Diagnosis not present

## 2020-02-11 DIAGNOSIS — I2510 Atherosclerotic heart disease of native coronary artery without angina pectoris: Secondary | ICD-10-CM | POA: Diagnosis not present

## 2020-02-11 DIAGNOSIS — Z9181 History of falling: Secondary | ICD-10-CM | POA: Diagnosis not present

## 2020-02-11 DIAGNOSIS — E7800 Pure hypercholesterolemia, unspecified: Secondary | ICD-10-CM | POA: Diagnosis not present

## 2020-02-11 DIAGNOSIS — I509 Heart failure, unspecified: Secondary | ICD-10-CM | POA: Diagnosis not present

## 2020-02-11 DIAGNOSIS — Z951 Presence of aortocoronary bypass graft: Secondary | ICD-10-CM | POA: Diagnosis not present

## 2020-02-11 DIAGNOSIS — F329 Major depressive disorder, single episode, unspecified: Secondary | ICD-10-CM | POA: Diagnosis not present

## 2020-02-11 DIAGNOSIS — I501 Left ventricular failure: Secondary | ICD-10-CM | POA: Diagnosis not present

## 2020-02-11 DIAGNOSIS — Z87891 Personal history of nicotine dependence: Secondary | ICD-10-CM | POA: Diagnosis not present

## 2020-02-11 DIAGNOSIS — Z955 Presence of coronary angioplasty implant and graft: Secondary | ICD-10-CM | POA: Diagnosis not present

## 2020-02-11 DIAGNOSIS — I110 Hypertensive heart disease with heart failure: Secondary | ICD-10-CM | POA: Diagnosis not present

## 2020-02-11 DIAGNOSIS — F0390 Unspecified dementia without behavioral disturbance: Secondary | ICD-10-CM | POA: Diagnosis not present

## 2020-02-11 DIAGNOSIS — Z8546 Personal history of malignant neoplasm of prostate: Secondary | ICD-10-CM | POA: Diagnosis not present

## 2020-02-12 DIAGNOSIS — R2689 Other abnormalities of gait and mobility: Secondary | ICD-10-CM | POA: Diagnosis not present

## 2020-02-17 DIAGNOSIS — Z8616 Personal history of COVID-19: Secondary | ICD-10-CM | POA: Diagnosis not present

## 2020-02-17 DIAGNOSIS — I110 Hypertensive heart disease with heart failure: Secondary | ICD-10-CM | POA: Diagnosis not present

## 2020-02-17 DIAGNOSIS — Z952 Presence of prosthetic heart valve: Secondary | ICD-10-CM | POA: Diagnosis not present

## 2020-02-17 DIAGNOSIS — E7800 Pure hypercholesterolemia, unspecified: Secondary | ICD-10-CM | POA: Diagnosis not present

## 2020-02-17 DIAGNOSIS — F329 Major depressive disorder, single episode, unspecified: Secondary | ICD-10-CM | POA: Diagnosis not present

## 2020-02-17 DIAGNOSIS — I509 Heart failure, unspecified: Secondary | ICD-10-CM | POA: Diagnosis not present

## 2020-02-17 DIAGNOSIS — Z87891 Personal history of nicotine dependence: Secondary | ICD-10-CM | POA: Diagnosis not present

## 2020-02-17 DIAGNOSIS — I501 Left ventricular failure: Secondary | ICD-10-CM | POA: Diagnosis not present

## 2020-02-17 DIAGNOSIS — I2510 Atherosclerotic heart disease of native coronary artery without angina pectoris: Secondary | ICD-10-CM | POA: Diagnosis not present

## 2020-02-17 DIAGNOSIS — Z955 Presence of coronary angioplasty implant and graft: Secondary | ICD-10-CM | POA: Diagnosis not present

## 2020-02-17 DIAGNOSIS — Z951 Presence of aortocoronary bypass graft: Secondary | ICD-10-CM | POA: Diagnosis not present

## 2020-02-17 DIAGNOSIS — F0390 Unspecified dementia without behavioral disturbance: Secondary | ICD-10-CM | POA: Diagnosis not present

## 2020-02-17 DIAGNOSIS — Z8546 Personal history of malignant neoplasm of prostate: Secondary | ICD-10-CM | POA: Diagnosis not present

## 2020-02-17 DIAGNOSIS — Z9181 History of falling: Secondary | ICD-10-CM | POA: Diagnosis not present

## 2020-02-17 DIAGNOSIS — Z7982 Long term (current) use of aspirin: Secondary | ICD-10-CM | POA: Diagnosis not present

## 2020-02-24 DIAGNOSIS — I350 Nonrheumatic aortic (valve) stenosis: Secondary | ICD-10-CM | POA: Diagnosis not present

## 2020-02-24 DIAGNOSIS — Z953 Presence of xenogenic heart valve: Secondary | ICD-10-CM | POA: Diagnosis not present

## 2020-02-24 DIAGNOSIS — S61411A Laceration without foreign body of right hand, initial encounter: Secondary | ICD-10-CM | POA: Diagnosis not present

## 2020-02-24 DIAGNOSIS — I501 Left ventricular failure: Secondary | ICD-10-CM | POA: Diagnosis not present

## 2020-02-24 DIAGNOSIS — R531 Weakness: Secondary | ICD-10-CM | POA: Diagnosis not present

## 2020-02-24 DIAGNOSIS — I110 Hypertensive heart disease with heart failure: Secondary | ICD-10-CM | POA: Diagnosis not present

## 2020-02-24 DIAGNOSIS — Z951 Presence of aortocoronary bypass graft: Secondary | ICD-10-CM | POA: Diagnosis not present

## 2020-02-24 DIAGNOSIS — E7800 Pure hypercholesterolemia, unspecified: Secondary | ICD-10-CM | POA: Diagnosis not present

## 2020-02-24 DIAGNOSIS — I2510 Atherosclerotic heart disease of native coronary artery without angina pectoris: Secondary | ICD-10-CM | POA: Diagnosis not present

## 2020-02-24 DIAGNOSIS — Z8616 Personal history of COVID-19: Secondary | ICD-10-CM | POA: Diagnosis not present

## 2020-02-24 DIAGNOSIS — I10 Essential (primary) hypertension: Secondary | ICD-10-CM | POA: Diagnosis not present

## 2020-02-24 DIAGNOSIS — Z23 Encounter for immunization: Secondary | ICD-10-CM | POA: Diagnosis not present

## 2020-02-24 DIAGNOSIS — F329 Major depressive disorder, single episode, unspecified: Secondary | ICD-10-CM | POA: Diagnosis not present

## 2020-02-24 DIAGNOSIS — Z7982 Long term (current) use of aspirin: Secondary | ICD-10-CM | POA: Diagnosis not present

## 2020-02-24 DIAGNOSIS — I509 Heart failure, unspecified: Secondary | ICD-10-CM | POA: Diagnosis not present

## 2020-02-24 DIAGNOSIS — Z87891 Personal history of nicotine dependence: Secondary | ICD-10-CM | POA: Diagnosis not present

## 2020-02-24 DIAGNOSIS — F0390 Unspecified dementia without behavioral disturbance: Secondary | ICD-10-CM | POA: Diagnosis not present

## 2020-02-24 DIAGNOSIS — I340 Nonrheumatic mitral (valve) insufficiency: Secondary | ICD-10-CM | POA: Diagnosis not present

## 2020-02-24 DIAGNOSIS — Z952 Presence of prosthetic heart valve: Secondary | ICD-10-CM | POA: Diagnosis not present

## 2020-02-24 DIAGNOSIS — Z8546 Personal history of malignant neoplasm of prostate: Secondary | ICD-10-CM | POA: Diagnosis not present

## 2020-02-24 DIAGNOSIS — Z9889 Other specified postprocedural states: Secondary | ICD-10-CM | POA: Diagnosis not present

## 2020-02-24 DIAGNOSIS — Z955 Presence of coronary angioplasty implant and graft: Secondary | ICD-10-CM | POA: Diagnosis not present

## 2020-02-24 DIAGNOSIS — Z9181 History of falling: Secondary | ICD-10-CM | POA: Diagnosis not present

## 2020-03-25 DIAGNOSIS — H903 Sensorineural hearing loss, bilateral: Secondary | ICD-10-CM | POA: Diagnosis not present

## 2020-03-25 DIAGNOSIS — H6123 Impacted cerumen, bilateral: Secondary | ICD-10-CM | POA: Diagnosis not present

## 2020-04-15 DIAGNOSIS — L408 Other psoriasis: Secondary | ICD-10-CM | POA: Diagnosis not present

## 2020-04-15 DIAGNOSIS — Z79899 Other long term (current) drug therapy: Secondary | ICD-10-CM | POA: Diagnosis not present

## 2020-04-16 DIAGNOSIS — I10 Essential (primary) hypertension: Secondary | ICD-10-CM | POA: Diagnosis not present

## 2020-04-16 DIAGNOSIS — F518 Other sleep disorders not due to a substance or known physiological condition: Secondary | ICD-10-CM | POA: Diagnosis not present

## 2020-04-16 DIAGNOSIS — Z951 Presence of aortocoronary bypass graft: Secondary | ICD-10-CM | POA: Diagnosis not present

## 2020-04-16 DIAGNOSIS — G933 Postviral fatigue syndrome: Secondary | ICD-10-CM | POA: Diagnosis not present

## 2020-04-16 DIAGNOSIS — I340 Nonrheumatic mitral (valve) insufficiency: Secondary | ICD-10-CM | POA: Diagnosis not present

## 2020-04-16 DIAGNOSIS — R2689 Other abnormalities of gait and mobility: Secondary | ICD-10-CM | POA: Diagnosis not present

## 2020-04-16 DIAGNOSIS — G309 Alzheimer's disease, unspecified: Secondary | ICD-10-CM | POA: Diagnosis not present

## 2020-04-16 DIAGNOSIS — F0280 Dementia in other diseases classified elsewhere without behavioral disturbance: Secondary | ICD-10-CM | POA: Diagnosis not present

## 2020-04-16 DIAGNOSIS — F0150 Vascular dementia without behavioral disturbance: Secondary | ICD-10-CM | POA: Diagnosis not present

## 2020-04-16 DIAGNOSIS — G4720 Circadian rhythm sleep disorder, unspecified type: Secondary | ICD-10-CM | POA: Diagnosis not present

## 2020-04-16 DIAGNOSIS — Z953 Presence of xenogenic heart valve: Secondary | ICD-10-CM | POA: Diagnosis not present

## 2020-04-23 DIAGNOSIS — H2513 Age-related nuclear cataract, bilateral: Secondary | ICD-10-CM | POA: Diagnosis not present

## 2020-05-06 DIAGNOSIS — F0150 Vascular dementia without behavioral disturbance: Secondary | ICD-10-CM | POA: Diagnosis not present

## 2020-05-06 DIAGNOSIS — F0280 Dementia in other diseases classified elsewhere without behavioral disturbance: Secondary | ICD-10-CM | POA: Diagnosis not present

## 2020-05-06 DIAGNOSIS — G309 Alzheimer's disease, unspecified: Secondary | ICD-10-CM | POA: Diagnosis not present

## 2020-08-13 DIAGNOSIS — I10 Essential (primary) hypertension: Secondary | ICD-10-CM | POA: Diagnosis not present

## 2020-08-13 DIAGNOSIS — E7800 Pure hypercholesterolemia, unspecified: Secondary | ICD-10-CM | POA: Diagnosis not present

## 2020-08-18 DIAGNOSIS — Z951 Presence of aortocoronary bypass graft: Secondary | ICD-10-CM | POA: Diagnosis not present

## 2020-08-18 DIAGNOSIS — I5020 Unspecified systolic (congestive) heart failure: Secondary | ICD-10-CM | POA: Diagnosis not present

## 2020-08-18 DIAGNOSIS — F0280 Dementia in other diseases classified elsewhere without behavioral disturbance: Secondary | ICD-10-CM | POA: Diagnosis not present

## 2020-08-18 DIAGNOSIS — I10 Essential (primary) hypertension: Secondary | ICD-10-CM | POA: Diagnosis not present

## 2020-08-18 DIAGNOSIS — F0150 Vascular dementia without behavioral disturbance: Secondary | ICD-10-CM | POA: Diagnosis not present

## 2020-08-18 DIAGNOSIS — E7800 Pure hypercholesterolemia, unspecified: Secondary | ICD-10-CM | POA: Diagnosis not present

## 2020-08-18 DIAGNOSIS — G309 Alzheimer's disease, unspecified: Secondary | ICD-10-CM | POA: Diagnosis not present

## 2020-08-18 DIAGNOSIS — D696 Thrombocytopenia, unspecified: Secondary | ICD-10-CM | POA: Diagnosis not present

## 2020-08-18 DIAGNOSIS — F32A Depression, unspecified: Secondary | ICD-10-CM | POA: Diagnosis not present

## 2020-09-17 DIAGNOSIS — H6123 Impacted cerumen, bilateral: Secondary | ICD-10-CM | POA: Diagnosis not present

## 2020-09-17 DIAGNOSIS — H903 Sensorineural hearing loss, bilateral: Secondary | ICD-10-CM | POA: Diagnosis not present

## 2020-10-06 DIAGNOSIS — R399 Unspecified symptoms and signs involving the genitourinary system: Secondary | ICD-10-CM | POA: Diagnosis not present

## 2020-10-13 DIAGNOSIS — Z79899 Other long term (current) drug therapy: Secondary | ICD-10-CM | POA: Diagnosis not present

## 2020-10-23 DIAGNOSIS — Z03818 Encounter for observation for suspected exposure to other biological agents ruled out: Secondary | ICD-10-CM | POA: Diagnosis not present

## 2020-10-23 DIAGNOSIS — R059 Cough, unspecified: Secondary | ICD-10-CM | POA: Diagnosis not present

## 2020-10-23 DIAGNOSIS — H1031 Unspecified acute conjunctivitis, right eye: Secondary | ICD-10-CM | POA: Diagnosis not present

## 2020-10-23 DIAGNOSIS — J22 Unspecified acute lower respiratory infection: Secondary | ICD-10-CM | POA: Diagnosis not present

## 2020-10-26 DIAGNOSIS — R059 Cough, unspecified: Secondary | ICD-10-CM | POA: Diagnosis not present

## 2020-10-26 DIAGNOSIS — J22 Unspecified acute lower respiratory infection: Secondary | ICD-10-CM | POA: Diagnosis not present

## 2020-10-26 DIAGNOSIS — D72829 Elevated white blood cell count, unspecified: Secondary | ICD-10-CM | POA: Diagnosis not present

## 2020-10-26 DIAGNOSIS — H109 Unspecified conjunctivitis: Secondary | ICD-10-CM | POA: Diagnosis not present

## 2020-11-27 DIAGNOSIS — G4720 Circadian rhythm sleep disorder, unspecified type: Secondary | ICD-10-CM | POA: Diagnosis not present

## 2020-11-27 DIAGNOSIS — F0150 Vascular dementia without behavioral disturbance: Secondary | ICD-10-CM | POA: Diagnosis not present

## 2020-11-27 DIAGNOSIS — G309 Alzheimer's disease, unspecified: Secondary | ICD-10-CM | POA: Diagnosis not present

## 2020-11-27 DIAGNOSIS — R2689 Other abnormalities of gait and mobility: Secondary | ICD-10-CM | POA: Diagnosis not present

## 2020-11-27 DIAGNOSIS — F0280 Dementia in other diseases classified elsewhere without behavioral disturbance: Secondary | ICD-10-CM | POA: Diagnosis not present

## 2020-11-27 DIAGNOSIS — F518 Other sleep disorders not due to a substance or known physiological condition: Secondary | ICD-10-CM | POA: Diagnosis not present

## 2020-12-07 ENCOUNTER — Telehealth: Payer: Self-pay | Admitting: Nurse Practitioner

## 2020-12-07 NOTE — Telephone Encounter (Signed)
Spoke with patient's niece, [name], regarding the Palliative referral/services.  All questions were answered and she was in agreement with scheduling visit.  I have scheduled an In-home Consult for 12/17/20 @ 3:30 PM

## 2020-12-07 NOTE — Telephone Encounter (Signed)
Called patient's niece, Bradley Nelson, regarding the Palliative referral and to offer to schedule a visit, no answer - left message with reason for call along with my name and call back number.

## 2020-12-08 ENCOUNTER — Telehealth: Payer: Self-pay | Admitting: Nurse Practitioner

## 2020-12-08 NOTE — Telephone Encounter (Signed)
Returned call to patient's niece, Tawni Levy, and we have rescheduled the Palliative Consult for 12/22/20 @ 9 AM.

## 2020-12-08 NOTE — Telephone Encounter (Signed)
Rec'd message that niece needed to reschedule the 12/17/20 Palliative Consult, returned call to niece and she requested that I call her back in 30 minutes due to she was on a conference call.

## 2020-12-15 DIAGNOSIS — L408 Other psoriasis: Secondary | ICD-10-CM | POA: Diagnosis not present

## 2020-12-15 DIAGNOSIS — X32XXXA Exposure to sunlight, initial encounter: Secondary | ICD-10-CM | POA: Diagnosis not present

## 2020-12-15 DIAGNOSIS — L570 Actinic keratosis: Secondary | ICD-10-CM | POA: Diagnosis not present

## 2020-12-15 DIAGNOSIS — Z79899 Other long term (current) drug therapy: Secondary | ICD-10-CM | POA: Diagnosis not present

## 2020-12-17 ENCOUNTER — Other Ambulatory Visit: Payer: Self-pay | Admitting: Nurse Practitioner

## 2020-12-22 ENCOUNTER — Encounter: Payer: Self-pay | Admitting: Nurse Practitioner

## 2020-12-22 ENCOUNTER — Other Ambulatory Visit: Payer: PPO | Admitting: Nurse Practitioner

## 2020-12-22 ENCOUNTER — Other Ambulatory Visit: Payer: Self-pay

## 2020-12-22 DIAGNOSIS — R5381 Other malaise: Secondary | ICD-10-CM | POA: Diagnosis not present

## 2020-12-22 DIAGNOSIS — Z515 Encounter for palliative care: Secondary | ICD-10-CM

## 2020-12-22 NOTE — Progress Notes (Signed)
[facility] Consult Note Telephone: [phone]  Fax: [phone]    Date of encounter: 12/22/20 PATIENT NAME: Bradley Nelson [place] [address]   [id number] (home)  DOB: 02-10-1932 MRN: [id number] PRIMARY CARE PROVIDER:    Baxter Hire, MD,  [place] [address] [phone]  RESPONSIBLE PARTY:    Contact Information    Name Relation Home Work [place] Daughter [phone]     Bradley Nelson Niece [phone]  [phone]   Evern Bio [phone]  [phone]     I met face to face with patient and family, Bradley Nelson, niece Bradley Nelson home. Palliative Care was asked to follow this patient by consultation request of  Bradley Hire, MD to address advance care planning and complex medical decision making. This is the initial visit.  ASSESSMENT AND PLAN / RECOMMENDATIONS:   Advance Care Planning/Goals of Care: Goals include to maximize quality of life and symptom management. Our advance care planning conversation included a discussion about:     The value and importance of advance care planning   Experiences with loved ones who have been seriously ill or have died   Exploration of personal, cultural or spiritual beliefs that might influence medical decisions   Exploration of goals of care in the event of a sudden injury or illness   Identification and preparation of a healthcare agent   Review and updating or creation of an  advance directive document .  Decision not to resuscitate or to de-escalate disease focused treatments due to poor prognosis.  Symptom Management/Plan: 1. ACP; discussed code status at length per chart documentation wishes are DNR, Bradley Nelson wanted to check Bradley Nelson living will, will send copy and will place in vynca. We talked about MOST form, blank form left for further discussion. Bradley Nelson endorses she review and will  return call will complete DNR/MOST if wishes.   2. Debility secondary to dementia, discussed chronic disease progression with realistic expectations. Discussed not at present time is Bradley Nelson eligible for hospice services as not has declined to meet eligibility. Hospice services reviewed as well as PC, will continue to follow  3. Palliative care encounter; Palliative care encounter; Palliative medicine team will continue to support patient, patient's family, and medical team. Visit consisted of counseling and education dealing with the complex and emotionally intense issues of symptom management and palliative care in the setting of serious and potentially life-threatening illness  Follow up Palliative Care Visit: Palliative care will continue to follow for complex medical decision making, advance care planning, and clarification of goals. Return 4 weeks or prn.  I spent 60 minutes providing this consultation. More than 50% of the time in this consultation was spent in counseling and care coordination.  PPS: 40%  HOSPICE ELIGIBILITY/DIAGNOSIS: TBD  Chief Complaint: Initial Palliative consult for complex medical decision making  HISTORY OF PRESENT ILLNESS:  Bradley Nelson is a 87 y.o. year old male  with Vascular dementia with Alzheimer's disease with behavioral disturbances including hallucinations, Prostate cancer s/p prostatectomy, coronary artery disease s/p cabbage, Heart failure, aortic valve replacement, aortic heart valve narrowing, history of angina, hypertension, hypercholesterolemia, history of skin cancer, hernia repair. DNR per documentation. Neurology appointment with Dr Manuella Nelson 11/27/2020 current weight 160lbs. Mixed advanced dementia vascular and Alzheimer's with behavioral disturbances to include hallucinations with agitation and sleep disturbances with a history of microhemorrhages and ventriculomegaly. Progressively worsening symptoms since 1 slash 2022 requiring 24  hour care,  hospital bed, worsening appetite started Seroquel. On Trazodone for insomnia. COVID infection 08/2019 hospitalized but not put on a ventilator with long term symptoms of fatigue and decreased strength. I called Bradley Nelson and Bradley Nelson to confirm palliative care visit in person and covid screening which was negative. I visited Bradley Nelson in his home with Bradley Nelson his niece HCPOA who shares with Bradley Nelson son/daughter in-law. .Bradley Nelson was sitting in the recliner inHis living room period Bradley Nelson appears. Bradley Nelson denies symptoms of pain or shortness of breath. 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 We talked about Bradley Nelson residing in his home for over 60 years. We talked about the last time he was independent,In the setting of chronic disease progression of dementia with aging. We talked about vascular and alzheimer's dementia. We talked about behaviors as seroquel was just recently started at bedtime. Since the seroquel was started the wandering behaviors have improved. Bradley Nelson has never tried to go out of the house. Bradley Nelson does have 24 hr paid caregivers. We talked about disease progression. We talked about cognitive changes . We talked about realistic expectations with progression. We talked about his functional ability which worsen after covid infection. Bradley Nelson was able to go from a sitting position in the recliner and lift chair to standing period now he requires assistance to get out of the chair and turn, pivot. We talked about the challenges with the commode. We talked about at present time Bradley Nelson does require to be bathed though he does bathe his private areas himself. Bradley Nelson does require assistance with dressing. Bradley Nelson does feed himself and appetite has been good. Bradley Nelson endorses it appears that he has lost muscle mass though not weight as his appetite continues to be good. Bradley Nelson does not eat as many  vegetables as he did when his wife was alive. They used to have a large garden in the back and she was the best cook per Bradley Nelson. We did talk about challenges getting Bradley Nelson to primary provider visits. Bradley Nelson endorses she does continue with Dr Manuella Nelson as well Neurology as well as Cardiology but primary care is getting more difficult to get him to the appointment and having to wait. We talked about in home primary providers that does provide house call service, information and contact information provided to [facility]. We talked about medical goals of care. We talked about the living will. If we talked about aggressive versus conservative versus comfort care period blank MOST form reviewed. Left blank copy of MOST form for further discussion period we talked about code status as it appears in his chart he is a DNR. Bradley Nelson endorses they do not have a [place] form in the home. Bradley Nelson wants to wait to complete that until she confirms with the actual living will that his wishes are just that to be a DNR. We talked about Medicare benefit under Hospice services, what is provided in the criteria for eligibility. At present time Bradley Nelson is doing fairly well, stable would not likely qualify. Discussed with Loletha Carrow will continue to follow a monitor with palliative care with functional and cognitive changes. Once meets Hospice eligibility will revisit. We talked about role palliative care and plan of care. Follow up palliative care visit scheduled. Therapeutic listening, emotional support provided. Questions answered to satisfaction.  History obtained from review of EMR, discussion with interview with family, Bradley Nelson and  Bradley Nelson.  I reviewed available labs, medications, imaging, studies and related documents from the EMR.  Records reviewed and summarized above.   ROS Full 14 system review of systems performed and negative with exception of: as per HPI.  Physical Exam Constitutional: NAD General: frail appearing,  thin, pleasant male EYES: anicteric sclera, lids intact, no discharge  ENMT: oral mucous membranes moist CV: S1S2, RRR, no LE edema Pulmonary: LCTA, no increased work of breathing, no cough, room air Abdomen:normo-active BS + 4 quadrants, soft and non tender GU: deferred MSK:  ambulatory with walker Skin: warm and dry, no rashes or wounds on visible skin Neuro:  + generalized weakness,  + cognitive impairment Psych: non-anxious affect, A and Oriented to self, place, difficulty with time, recall  CURRENT PROBLEM LIST:  Patient Active Problem List   Diagnosis Date Noted  . Generalized weakness 08/12/2019  . COVID-19 virus infection 08/12/2019  . Depression 08/12/2019  . DNR (do not resuscitate) 08/11/2019  . Malignant neoplastic disease ([place]) 04/28/2015  . Hypercholesterolemia 10/15/2014  . Essential (primary) hypertension 06/02/2014  . Acute blood loss anemia 01/25/2013  . Bloody pleural effusion 01/25/2013  . Pain following surgery or procedure 01/25/2013  . H/O aortic valve replacement 01/25/2013  . H/O coronary artery bypass surgery 01/25/2013  . History of open heart surgery 01/25/2013  . Angina, class II ([place]) 01/22/2013  . Heart failure ([place]) 01/22/2013  . Coronary artery disease 01/17/2013  . Aortic heart valve narrowing 01/17/2013   PAST MEDICAL HISTORY:  Active Ambulatory Problems    Diagnosis Date Noted  . Coronary artery disease 01/17/2013  . Acute blood loss anemia 01/25/2013  . Aortic heart valve narrowing 01/17/2013  . Angina, class II ([place]) 01/22/2013  . Malignant neoplastic disease ([place]) 04/28/2015  . Heart failure ([place]) 01/22/2013  . Essential (primary) hypertension 06/02/2014  . Bloody pleural effusion 01/25/2013  . Hypercholesterolemia 10/15/2014  . Pain following surgery or procedure 01/25/2013  . H/O aortic valve replacement 01/25/2013  . H/O coronary artery bypass surgery 01/25/2013  . History of open heart surgery 01/25/2013  . DNR (do not  resuscitate) 08/11/2019  . Generalized weakness 08/12/2019  . COVID-19 virus infection 08/12/2019  . Depression 08/12/2019   Resolved Ambulatory Problems    Diagnosis Date Noted  . COVID-19 08/11/2019   Past Medical History:  Diagnosis Date  . Prostate cancer ([place])   . Skin cancer    SOCIAL HX:  Social History   Tobacco Use  . Smoking status: Former Research scientist (life sciences)  . Smokeless tobacco: Never Used  Substance Use Topics  . Alcohol use: No    Alcohol/week: 0.0 standard drinks   FAMILY HX:  Family History  Problem Relation Age of Onset  . Prostate cancer Father   . Kidney cancer Neg Hx   . Bladder Cancer Neg Hx     Reviewed  ALLERGIES:  Allergies  Allergen Reactions  . Hydrocodone Other (See Comments)  . Oxycodone Other (See Comments)     PERTINENT MEDICATIONS:  Outpatient Encounter Medications as of 12/22/2020  Medication Sig  . alfuzosin (UROXATRAL) 10 MG 24 hr tablet Take 10 mg by mouth daily.  Marland Kitchen aspirin 81 MG chewable tablet Chew 1 tablet by mouth daily.  . cholecalciferol (VITAMIN D3) 25 MCG (1000 UT) tablet Take 1,000 Units by mouth daily.  . Coenzyme Q10 (CO Q-10) 100 MG CAPS Take 100 mg by mouth daily.  . famotidine (PEPCID) 20 MG tablet Take 20 mg by mouth 2 (two) times daily.  Marland Kitchen  Lactobacillus (ACIDOPHILUS) 100 MG CAPS Take 1 capsule by mouth daily.  Marland Kitchen lisinopril (ZESTRIL) 10 MG tablet Take 10 mg by mouth daily.  . metoprolol tartrate (LOPRESSOR) 25 MG tablet Take 25 mg by mouth 2 (two) times daily.  . Multiple Vitamins-Minerals (CENTRUM SILVER) tablet Take 1 tablet by mouth daily.  . sertraline (ZOLOFT) 25 MG tablet Take 30 mg by mouth daily.  . simvastatin (ZOCOR) 80 MG tablet Take 0.5 tablets by mouth every evening.   No facility-administered encounter medications on file as of 12/22/2020.  _0 Questions and concerns were addressed. The patient/family was encouraged to call with questions and/or concerns. My contact was provided. Provided general support and  encouragement, no other unmet needs identified  Thank you for the opportunity to participate in the care of Bradley. Hillis.  The palliative care team will continue to follow. Please call our office at [phone] if we can be of additional assistance.   This chart was dictated using voice recognition software. Despite best efforts to proofread, errors can occur which can change the documentation meaning.    Z , NP , MSN, ACHPN  COVID-19 PATIENT SCREENING TOOL Asked and negative response unless otherwise noted:   Have you had symptoms of covid, tested positive or been in contact with someone with symptoms/positive test in the past 5-10 days? NO

## 2020-12-24 DIAGNOSIS — F0150 Vascular dementia without behavioral disturbance: Secondary | ICD-10-CM | POA: Diagnosis not present

## 2020-12-24 DIAGNOSIS — F0280 Dementia in other diseases classified elsewhere without behavioral disturbance: Secondary | ICD-10-CM | POA: Diagnosis not present

## 2020-12-24 DIAGNOSIS — G309 Alzheimer's disease, unspecified: Secondary | ICD-10-CM | POA: Diagnosis not present

## 2021-01-01 ENCOUNTER — Emergency Department
Admission: EM | Admit: 2021-01-01 | Discharge: 2021-01-01 | Disposition: A | Discharge status: Home / Self Care | Payer: PPO | Attending: Emergency Medicine | Admitting: Emergency Medicine

## 2021-01-01 ENCOUNTER — Emergency Department: Payer: PPO

## 2021-01-01 ENCOUNTER — Other Ambulatory Visit: Payer: Self-pay

## 2021-01-01 DIAGNOSIS — Z7982 Long term (current) use of aspirin: Secondary | ICD-10-CM | POA: Insufficient documentation

## 2021-01-01 DIAGNOSIS — R531 Weakness: Secondary | ICD-10-CM | POA: Diagnosis not present

## 2021-01-01 DIAGNOSIS — I509 Heart failure, unspecified: Secondary | ICD-10-CM | POA: Insufficient documentation

## 2021-01-01 DIAGNOSIS — Z8616 Personal history of COVID-19: Secondary | ICD-10-CM | POA: Diagnosis not present

## 2021-01-01 DIAGNOSIS — Z85828 Personal history of other malignant neoplasm of skin: Secondary | ICD-10-CM | POA: Insufficient documentation

## 2021-01-01 DIAGNOSIS — R0902 Hypoxemia: Secondary | ICD-10-CM | POA: Diagnosis not present

## 2021-01-01 DIAGNOSIS — Z8546 Personal history of malignant neoplasm of prostate: Secondary | ICD-10-CM | POA: Diagnosis not present

## 2021-01-01 DIAGNOSIS — Z20822 Contact with and (suspected) exposure to covid-19: Secondary | ICD-10-CM | POA: Insufficient documentation

## 2021-01-01 DIAGNOSIS — Z951 Presence of aortocoronary bypass graft: Secondary | ICD-10-CM | POA: Insufficient documentation

## 2021-01-01 DIAGNOSIS — Z79899 Other long term (current) drug therapy: Secondary | ICD-10-CM | POA: Diagnosis not present

## 2021-01-01 DIAGNOSIS — I2510 Atherosclerotic heart disease of native coronary artery without angina pectoris: Secondary | ICD-10-CM | POA: Insufficient documentation

## 2021-01-01 DIAGNOSIS — I6782 Cerebral ischemia: Secondary | ICD-10-CM | POA: Diagnosis not present

## 2021-01-01 DIAGNOSIS — Z87891 Personal history of nicotine dependence: Secondary | ICD-10-CM | POA: Insufficient documentation

## 2021-01-01 DIAGNOSIS — G319 Degenerative disease of nervous system, unspecified: Secondary | ICD-10-CM | POA: Diagnosis not present

## 2021-01-01 DIAGNOSIS — F0390 Unspecified dementia without behavioral disturbance: Secondary | ICD-10-CM | POA: Insufficient documentation

## 2021-01-01 DIAGNOSIS — I110 Hypertensive heart disease with heart failure: Secondary | ICD-10-CM | POA: Diagnosis not present

## 2021-01-01 LAB — URINALYSIS, COMPLETE (UACMP) WITH MICROSCOPIC
Bacteria, UA: NONE SEEN
Bilirubin Urine: NEGATIVE
Glucose, UA: NEGATIVE mg/dL
Hgb urine dipstick: NEGATIVE
Ketones, ur: NEGATIVE mg/dL
Leukocytes,Ua: NEGATIVE
Nitrite: NEGATIVE
Protein, ur: NEGATIVE mg/dL
Specific Gravity, Urine: 1.018 (ref 1.005–1.030)
Squamous Epithelial / HPF: NONE SEEN (ref 0–5)
pH: 6 (ref 5.0–8.0)

## 2021-01-01 LAB — CBC WITH DIFFERENTIAL/PLATELET
Abs Immature Granulocytes: 0.03 10*3/uL (ref 0.00–0.07)
Basophils Absolute: 0.1 10*3/uL (ref 0.0–0.1)
Basophils Relative: 1 %
Eosinophils Absolute: 0.5 10*3/uL (ref 0.0–0.5)
Eosinophils Relative: 5 %
HCT: 45.5 % (ref 39.0–52.0)
Hemoglobin: 15.8 g/dL (ref 13.0–17.0)
Immature Granulocytes: 0 %
Lymphocytes Relative: 13 %
Lymphs Abs: 1.3 10*3/uL (ref 0.7–4.0)
MCH: 36.2 pg — ABNORMAL HIGH (ref 26.0–34.0)
MCHC: 34.7 g/dL (ref 30.0–36.0)
MCV: 104.1 fL — ABNORMAL HIGH (ref 80.0–100.0)
Monocytes Absolute: 0.7 10*3/uL (ref 0.1–1.0)
Monocytes Relative: 7 %
Neutro Abs: 7.5 10*3/uL (ref 1.7–7.7)
Neutrophils Relative %: 74 %
Platelets: 130 10*3/uL — ABNORMAL LOW (ref 150–400)
RBC: 4.37 MIL/uL (ref 4.22–5.81)
RDW: 14.1 % (ref 11.5–15.5)
WBC: 10.1 10*3/uL (ref 4.0–10.5)
nRBC: 0 % (ref 0.0–0.2)

## 2021-01-01 LAB — COMPREHENSIVE METABOLIC PANEL
ALT: 22 U/L (ref 0–44)
AST: 24 U/L (ref 15–41)
Albumin: 4.2 g/dL (ref 3.5–5.0)
Alkaline Phosphatase: 69 U/L (ref 38–126)
Anion gap: 9 (ref 5–15)
BUN: 24 mg/dL — ABNORMAL HIGH (ref 8–23)
CO2: 25 mmol/L (ref 22–32)
Calcium: 10.3 mg/dL (ref 8.9–10.3)
Chloride: 105 mmol/L (ref 98–111)
Creatinine, Ser: 1.21 mg/dL (ref 0.61–1.24)
GFR, Estimated: 58 mL/min — ABNORMAL LOW (ref >60)
Glucose, Bld: 111 mg/dL — ABNORMAL HIGH (ref 70–99)
Potassium: 4.4 mmol/L (ref 3.5–5.1)
Sodium: 139 mmol/L (ref 135–145)
Total Bilirubin: 1.2 mg/dL (ref 0.3–1.2)
Total Protein: 7 g/dL (ref 6.5–8.1)

## 2021-01-01 LAB — RESP PANEL BY RT-PCR (FLU A&B, COVID) ARPGX2
Influenza A by PCR: NEGATIVE
Influenza B by PCR: NEGATIVE
SARS Coronavirus 2 by RT PCR: NEGATIVE

## 2021-01-01 LAB — TROPONIN I (HIGH SENSITIVITY): Troponin I (High Sensitivity): 10 ng/L (ref <18)

## 2021-01-01 NOTE — ED Notes (Signed)
Patient transported to X-ray 

## 2021-01-01 NOTE — ED Provider Notes (Signed)
[facility] Emergency Department Provider Note   ____________________________________________   Event Date/Time   First MD Initiated Contact with Patient 01/01/21 1037     (approximate)  I have reviewed the triage vital signs and the nursing notes.   HISTORY  Chief Complaint Weakness    HPI LAFAYETTE DUNLEVY is a 87 y.o. male with past medical history of hypertension, hyperlipidemia, CAD status post CABG, CHF, and dementia who presents to the ED for generalized weakness.  History is limited due to patient's baseline dementia.  He states that he feels fine and is not sure why he is here.  EMS states that his caregiver was concerned that he was much weaker than usual over the past couple of days.  There was also concern for foul-smelling urine but patient denies any dysuria or hematuria.  He denies any fevers, cough, chest pain, shortness of breath, nausea, vomiting, diarrhea, or abdominal pain.  He is reportedly at his baseline mental status.        Past Medical History:  Diagnosis Date  . Acute blood loss anemia 01/25/2013  . Angina, class II ([place]) 01/22/2013  . Aortic heart valve narrowing 01/17/2013  . Bloody pleural effusion 01/25/2013  . COVID-19   . Essential (primary) hypertension 06/02/2014  . H/O aortic valve replacement 01/25/2013  . H/O coronary artery bypass surgery 01/25/2013   Overview:  cabg x3 with LIMA to LAD, SVG to PDA, SVG to left circumflex with septal muomectomy and aortic valve replacement with Ce Magna bioprosthetic valve on 01-24-13 by [name] at [facility].   . Heart failure ([place]) 01/22/2013  . History of open heart surgery 01/25/2013  . Hypercholesterolemia 10/15/2014  . Malignant neoplastic disease ([place]) 04/28/2015   Overview:  prostate cancer, s/p prostatectomy    . Prostate cancer ([place])   . Skin cancer     Patient Active Problem List   Diagnosis Date Noted  . Generalized weakness 08/12/2019  . COVID-19 virus infection 08/12/2019   . Depression 08/12/2019  . DNR (do not resuscitate) 08/11/2019  . Malignant neoplastic disease ([place]) 04/28/2015  . Hypercholesterolemia 10/15/2014  . Essential (primary) hypertension 06/02/2014  . Acute blood loss anemia 01/25/2013  . Bloody pleural effusion 01/25/2013  . Pain following surgery or procedure 01/25/2013  . H/O aortic valve replacement 01/25/2013  . H/O coronary artery bypass surgery 01/25/2013  . History of open heart surgery 01/25/2013  . Angina, class II ([place]) 01/22/2013  . Heart failure ([place]) 01/22/2013  . Coronary artery disease 01/17/2013  . Aortic heart valve narrowing 01/17/2013    Past Surgical History:  Procedure Laterality Date  . AORTIC VALVE REPLACEMENT    . CARDIAC SURGERY    . CORONARY ARTERY BYPASS GRAFT    . HERNIA REPAIR    . PROSTATECTOMY  2001   Dr. [name]    Prior to Admission medications   Medication Sig Start Date End Date Taking? Authorizing Provider  alfuzosin (UROXATRAL) 10 MG 24 hr tablet Take 10 mg by mouth daily. 07/29/19   [provider]  aspirin 81 MG chewable tablet Chew 1 tablet by mouth daily.    [provider]  cholecalciferol (VITAMIN D3) 25 MCG (1000 UT) tablet Take 1,000 Units by mouth daily.    [provider]  Coenzyme Q10 (CO Q-10) 100 MG CAPS Take 100 mg by mouth daily.    [provider]  famotidine (PEPCID) 20 MG tablet Take 20 mg by mouth 2 (two) times daily. 07/23/19  [provider]  Lactobacillus (ACIDOPHILUS) 100 MG CAPS Take 1 capsule by mouth daily.    [provider]  lisinopril (ZESTRIL) 10 MG tablet Take 10 mg by mouth daily. 05/14/19   [provider]  metoprolol tartrate (LOPRESSOR) 25 MG tablet Take 25 mg by mouth 2 (two) times daily. 05/14/19   [provider]  Multiple Vitamins-Minerals (CENTRUM SILVER) tablet Take 1 tablet by mouth daily.    [provider]  sertraline (ZOLOFT) 25 MG tablet Take 30 mg by mouth daily.     [provider]  simvastatin (ZOCOR) 80 MG tablet Take 0.5 tablets by mouth every evening. 12/02/15   [provider]    Allergies Hydrocodone and Oxycodone  Family History  Problem Relation Age of Onset  . Prostate cancer Father   . Kidney cancer Neg Hx   . Bladder Cancer Neg Hx     Social History Social History   Tobacco Use  . Smoking status: Former Research scientist (life sciences)  . Smokeless tobacco: Never Used  Substance Use Topics  . Alcohol use: No    Alcohol/week: 0.0 standard drinks  . Drug use: No    Review of Systems  Constitutional: No fever/chills.  Positive for generalized weakness. Eyes: No visual changes. ENT: No sore throat. Cardiovascular: Denies chest pain. Respiratory: Denies shortness of breath. Gastrointestinal: No abdominal pain.  No nausea, no vomiting.  No diarrhea.  No constipation. Genitourinary: Negative for dysuria. Musculoskeletal: Negative for back pain. Skin: Negative for rash. Neurological: Negative for headaches, focal weakness or numbness.  ____________________________________________   PHYSICAL EXAM:  VITAL SIGNS: ED Triage Vitals  Enc Vitals Group     BP 01/01/21 1047 133/82     Pulse Rate 01/01/21 1047 69     Resp 01/01/21 1047 16     Temp 01/01/21 1047 97.8 F (36.6 C)     Temp src --      SpO2 01/01/21 1047 97 %     Weight 01/01/21 1044 163 lb 2.3 oz (74 kg)     Height 01/01/21 1044 5\' 7"  (1.702 m)     Head Circumference --      Peak Flow --      Pain Score 01/01/21 1043 0     Pain Loc --      Pain Edu? --      Excl. in [place]? --     Constitutional: Alert and oriented to person and place, but not time or situation. Eyes: Conjunctivae are normal. Head: Atraumatic. Nose: No congestion/rhinnorhea. Mouth/Throat: Mucous membranes are moist. Neck: Normal ROM Cardiovascular: Normal rate, regular rhythm. Grossly normal heart sounds.  2+ radial pulses bilaterally. Respiratory: Normal respiratory effort.  No retractions. Lungs  CTAB. Gastrointestinal: Soft and nontender. No distention. Genitourinary: deferred Musculoskeletal: No lower extremity tenderness nor edema. Neurologic:  Normal speech and language. No gross focal neurologic deficits are appreciated. Skin:  Skin is warm, dry and intact. No rash noted. Psychiatric: Mood and affect are normal. Speech and behavior are normal.  ____________________________________________   LABS (all labs ordered are listed, but only abnormal results are displayed)  Labs Reviewed  CBC WITH DIFFERENTIAL/PLATELET - Abnormal; Notable for the following components:      Result Value   MCV 104.1 (*)    MCH 36.2 (*)    Platelets 130 (*)    All other components within normal limits  COMPREHENSIVE METABOLIC PANEL - Abnormal; Notable for the following components:   Glucose, Bld 111 (*)    BUN 24 (*)  GFR, Estimated 58 (*)    All other components within normal limits  URINALYSIS, COMPLETE (UACMP) WITH MICROSCOPIC - Abnormal; Notable for the following components:   Color, Urine YELLOW (*)    APPearance CLEAR (*)    All other components within normal limits  RESP PANEL BY RT-PCR (FLU A&B, COVID) ARPGX2  TROPONIN I (HIGH SENSITIVITY)   ____________________________________________  EKG  ED ECG REPORT I, Blake Divine, the attending physician, personally viewed and interpreted this ECG.   Date: 01/01/2021  EKG Time: 11:24  Rate: 70  Rhythm: normal sinus rhythm  Axis: LAD  Intervals:nonspecific intraventricular conduction delay  ST&T Change: None   PROCEDURES  Procedure(s) performed (including Critical Care):  Procedures   ____________________________________________   INITIAL IMPRESSION / ASSESSMENT AND PLAN / ED COURSE       87 year old male with past medical history of dementia, hypertension, hyperlipidemia, CAD status post CABG, and CHF who presents to the ED for generalized weakness.  Vital signs are reassuring and patient currently denies any  complaints.  He appears at his baseline mental status and has no focal neurologic deficits.  We will screen EKG and labs, check chest x-ray and UA for infectious process.  EKG shows no evidence of arrhythmia or ischemia, labs are reassuring with troponin within normal limits.  Chest x-ray reviewed by me and shows no infiltrate, edema, or effusion.  UA shows no signs of infection.  Patient's niece, who is also POA, is now at bedside and states patient has been generally weak for the past couple of days, slightly more confused at times but now back to his baseline.  She request CT head be performed, which was done and negative for acute process.  Given reassuring work-up, patient is appropriate for discharge home with PCP follow-up, was counseled to return to the ED for new worsening symptoms.  Patient and niece agree with plan.      ____________________________________________   FINAL CLINICAL IMPRESSION(S) / ED DIAGNOSES  Final diagnoses:  Generalized weakness  Dementia without behavioral disturbance, unspecified dementia type [facility])     ED Discharge Orders    None       Note:  This document was prepared using Dragon voice recognition software and may include unintentional dictation errors.   Blake Divine, MD 01/01/21 1440

## 2021-01-01 NOTE — ED Triage Notes (Signed)
Pt BIBA from home for weakness. Pt hx of dementia and CHF. Pt caregiver states pt has and generalized weakness x couple days. Also reports foul smelling odor x 1 week. Vitals stable on arrival.

## 2021-01-01 NOTE — ED Notes (Signed)
Patient transported to CT 

## 2021-01-24 DIAGNOSIS — G309 Alzheimer's disease, unspecified: Secondary | ICD-10-CM | POA: Diagnosis not present

## 2021-01-24 DIAGNOSIS — F0280 Dementia in other diseases classified elsewhere without behavioral disturbance: Secondary | ICD-10-CM | POA: Diagnosis not present

## 2021-01-24 DIAGNOSIS — F0150 Vascular dementia without behavioral disturbance: Secondary | ICD-10-CM | POA: Diagnosis not present

## 2021-01-27 ENCOUNTER — Other Ambulatory Visit: Payer: PPO | Admitting: Nurse Practitioner

## 2021-01-27 ENCOUNTER — Other Ambulatory Visit: Payer: Self-pay

## 2021-01-27 ENCOUNTER — Encounter: Payer: Self-pay | Admitting: Nurse Practitioner

## 2021-01-27 DIAGNOSIS — R5381 Other malaise: Secondary | ICD-10-CM

## 2021-01-27 DIAGNOSIS — Z515 Encounter for palliative care: Secondary | ICD-10-CM

## 2021-01-27 DIAGNOSIS — R531 Weakness: Secondary | ICD-10-CM

## 2021-01-27 NOTE — Progress Notes (Signed)
[place] Consult Note Telephone: [phone]  Fax: [phone]    Date of encounter: 01/27/21 PATIENT NAME: Bradley Nelson [id number]   [phone] (home)  DOB: 1931-10-26 MRN: [id number] PRIMARY CARE PROVIDER:    Baxter Hire, MD,  [place] [address] [phone]   RESPONSIBLE PARTY:    Contact Information     Name Relation Home Work [place] Daughter [phone]     Gregoire,Vicky Niece [phone]  [phone]   Evern Bio [phone]  [phone]       Due to the COVID-19 crisis, this visit was done via telemedicine from my office and it was initiated and consent by this patient and or family.  I connected with  Renold Don on 01/27/21 by a video enabled telemedicine application and verified that I am speaking with the correct person.   I discussed the limitations of evaluation and management by telemedicine telephonic as video not available. The patient expressed understanding and agreed to proceed. Palliative Care was asked to follow this patient by consultation request of  Baxter Hire, MD to address advance care planning and complex medical decision making. This is a follow up visit.  ASSESSMENT AND PLAN / RECOMMENDATIONS:  Symptom Management/Plan: 1. ACP; discussed code status at length per chart documentation wishes are DNR. We talked about decline, Hospice services and Vicky in agreement to have Hospice Physicians to review for eligibility    2. Debility/generalized weakness secondary to vascular with alzheimer's dementia with behaviors, discussed chronic disease progression with realistic expectations. Discussed not at present time is Mr. Wavra eligible for hospice services as not has declined to meet eligibility. Hospice services reviewed as well as PC, will continue to follow   3. Palliative care  encounter; Palliative care encounter; Palliative medicine team will continue to support patient, patient's family, and medical team. Visit consisted of counseling and education dealing with the complex and emotionally intense issues of symptom management and palliative care in the setting of serious and potentially life-threatening illness  I spent 35 minutes providing this consultation. More than 50% of the time in this consultation was spent in counseling and care coordination.  PPS: 30%  HOSPICE ELIGIBILITY/DIAGNOSIS: TBD  Chief Complaint: Follow up visit for palliative consult for complex medical decision making  HISTORY OF PRESENT ILLNESS:  Bradley Nelson is a 87 y.o. year old male  with multiple medical problems including .  Bradley Nelson is a 87 y.o. year old male  with Mixed advanced dementia vascular and Alzheimer's with behavioral disturbances to include hallucinations with agitation and sleep disturbances with a history of microhemorrhages and ventriculomegal, Prostate cancer s/p prostatectomy, coronary artery disease s/p cabbage, Heart failure, aortic valve replacement, aortic heart valve narrowing, history of angina, hypertension, , COVID infection 08/2019 hospitalized but not put on a ventilator with long term symptoms of fatigue and decreased strength.  01/01/2021 seen in ED for weakness, workup showed progression dementia. I called Vicky for telemedicine, telephonic PC f/u visit. We talked about how Mr. Tavano has been feeling. Vicky endorses Functionally progressively worsening symptoms since 1/ 2022 requiring 24 hour care, hospital bed, Seen 4 weeks ago he was able to get out of a chair, bed to standing position, walked with walker; PPS high 40%; now has had a fall, has to be lifted to a standing position, takes a few steps with a lot of support PPS low  40% close to 30%. Requires to be bathed, dressed, incontinence. Describes legs as weak, spaghetti over the last 4 weeks. Weight 6  months ago 167 lbs 4 weeks ago 163 lbs; weight 160lb has progressed in the last 2 months from eating with utensils, feeding self to finger foods taking>30 to 45 minutes to eat a lot more assistance; over the last 4 weeks eats an egg with piece sausage for breakfast; supplement for lunch; then few bites for dinner if he eats. On my visit 4 weeks ago he was eating full meals consistently. Cognitive worsening with more resistant behaviors, verbally. We talked about chronic disease progression of dementia with realistic expectations. We talked about medical goals of care. We talked about concerns with behaviors, functional and cognitive decline. We talked about quality of life. We talked about caregivers. We talked about option of Hospice services through [facility] program. Hospice eligibility and services discussed. Vicky in agreement to have Hospice Physicians to review case. Discussed with Vicky once reviewed will re-contact for further discussion if eligible to proceed with hospice or schedule f/u PC visit. Vicky in agreement. Therapeutic listening, emotional support provided. Questions answered.   History obtained from review of EMR, discussion with niece, Casilda Carls for Mr. Marrow.  I reviewed available labs, medications, imaging, studies and related documents from the EMR.  Records reviewed and summarized above.   ROS Full 14 system review of systems performed and negative with exception of: as per HPI.   Physical Exam: Deferred  Questions and concerns were addressed. The niece was encouraged to call with questions and/or concerns. My contact information was provided. Provided general support and encouragement, no other unmet needs identified   Thank you for the opportunity to participate in the care of Mr. Pulsifer.  The palliative care team will continue to follow. Please call our office at [phone] if we can be of additional assistance.   This chart was dictated using voice recognition  software.  Despite best efforts to proofread,  errors can occur which can change the documentation meaning.   Izekiel Flegel Ihor Gully, NP , MSN, [facility]

## 2021-02-15 DIAGNOSIS — G4720 Circadian rhythm sleep disorder, unspecified type: Secondary | ICD-10-CM | POA: Diagnosis not present

## 2021-02-15 DIAGNOSIS — F518 Other sleep disorders not due to a substance or known physiological condition: Secondary | ICD-10-CM | POA: Diagnosis not present

## 2021-02-16 ENCOUNTER — Telehealth: Payer: Self-pay

## 2021-02-16 NOTE — Telephone Encounter (Signed)
241 pm. Phone call made to [name] as requested by Natalia Leatherwood, NP.  No answer at this time.  Message has been left requesting a callback.    Response pending.

## 2021-02-18 ENCOUNTER — Telehealth: Payer: Self-pay

## 2021-02-18 NOTE — Telephone Encounter (Signed)
545 pm.  Return call made to niece Vicky.  Visit is scheduled for next Thursday at 9 am.

## 2021-02-23 DIAGNOSIS — F0280 Dementia in other diseases classified elsewhere without behavioral disturbance: Secondary | ICD-10-CM | POA: Diagnosis not present

## 2021-02-23 DIAGNOSIS — F0150 Vascular dementia without behavioral disturbance: Secondary | ICD-10-CM | POA: Diagnosis not present

## 2021-02-23 DIAGNOSIS — G309 Alzheimer's disease, unspecified: Secondary | ICD-10-CM | POA: Diagnosis not present

## 2021-02-25 ENCOUNTER — Other Ambulatory Visit: Payer: PPO

## 2021-02-25 ENCOUNTER — Other Ambulatory Visit: Payer: Self-pay

## 2021-02-25 VITALS — BP 122/80 | HR 65 | Temp 97.2°F | Resp 22

## 2021-02-25 DIAGNOSIS — Z515 Encounter for palliative care: Secondary | ICD-10-CM

## 2021-02-25 NOTE — Progress Notes (Signed)
PATIENT NAME: Bradley Nelson DOB: 12/29/1931 MRN: [id number]  PRIMARY CARE PROVIDER: Baxter Hire, MD  RESPONSIBLE PARTY:  Acct ID - Guarantor Home Phone Work Phone Relationship Acct Type  [id number] [name], [name]* [phone]  Self P/F     [place], [place], [place] [address]    PLAN OF CARE and INTERVENTIONS:               1.  GOALS OF CARE/ ADVANCE CARE PLANNING:  MOST form has been completed but needs provider signature.  Family desires DNR and comfort care for patient.  Family would like hospice when patient is appropriate.               2.  PATIENT/CAREGIVER EDUCATION:  Discussed constipation, dehydration               4. PERSONAL EMERGENCY PLAN:  Activate 911 for emergencies.               5.  DISEASE STATUS:  Joint visit completed with patient, niece Loletha Carrow and private caregiver Clarene Critchley.  Patient is found in the kitchen completing breakfast.  He is able to feed himself but needs cues at times.  Po intake is good for breakfast but fluctuates for lunch and dinner.  Patient enjoys sweets and family will give breakfast foods if he does not eat well during the day.  Boost is being given 2x a day sometimes more often if appetite is poor.  Patient is non-ambulatory.  He is only able to do a pivot transfer with extensive assistance.  He is using a transport chair in the home.  [name] noted a recent fall where patient's knees buckled and he sat in the floor.  No injuries were reported.  Significant weakness present to lower extremities.  Patient is able to hold a grab bar in the bathroom to assist with a pivot transfer.  Constipation has become an issue for patient.  Miralax is being administered 2 x weekly and senekot daily.   Last bm was yesterday. Niece is aware to hold for diarrhea.  Patient dribbles urine and is wearing depends otherwise is continent of bowel and bladder.  Private caregivers are toileting patient routinely.  Patient requires total assistance with  dressing and bathing.  Niece reports some issues with agitation with care and if patient is more confused than normal.  Increase confusion reported by family.  Patient does not realize he is at home most days.  Was previously having some issues with insomnia during the night.  Seroquel increased.  Patient is now getting up 1x during the night vs 3-4 x.  He continues to nap during the day.  Sleeping for 2-3 hours during the day.  Patient with drooping right lower lid.  Family has been placing rewetting drops to eyes but this has not been helpful.  Family with follow up with opthalmology.  Patient was recently seen for psoriasis and was treated with methotrexate.  Niece notes arms and trunk were effected but this has now resolved.  Patient is now taking 2 tablets every 7 days.  Notified [name], NP of MOST form needing signature and DNR needed in the home.  NP will coordinate with niece [name] to complete paperwork.     HISTORY OF PRESENT ILLNESS:  87 year old male with Vascular Dementia.  Patient is being followed by Palliative Care every 4-8 weeks and PRN.  CODE STATUS: Full Code at this time.  Desires DNR and will need  a gold form. ADVANCED DIRECTIVES: Yes MOST FORM: Yes but needs a signature of provider. PPS: 30%   PHYSICAL EXAM:   VITALS: Today's Vitals   02/25/21 0912  BP: 122/80  Pulse: 65  Resp: (!) 22  Temp: (!) 97.2 F (36.2 C)  SpO2: 97%  PainSc: 0-No pain    LUNGS: clear to auscultation  CARDIAC: Cor RRR}  EXTREMITIES: - for edema SKIN: Skin color, texture, turgor normal. No rashes or lesions or normal  NEURO: positive for coordination problems, gait problems, memory problems, and weakness       [name], RN

## 2021-03-04 DIAGNOSIS — H02132 Senile ectropion of right lower eyelid: Secondary | ICD-10-CM | POA: Diagnosis not present

## 2021-03-12 ENCOUNTER — Other Ambulatory Visit: Payer: Self-pay

## 2021-03-12 ENCOUNTER — Other Ambulatory Visit: Payer: PPO | Admitting: Nurse Practitioner

## 2021-03-12 ENCOUNTER — Encounter: Payer: Self-pay | Admitting: Nurse Practitioner

## 2021-03-12 DIAGNOSIS — Z515 Encounter for palliative care: Secondary | ICD-10-CM | POA: Diagnosis not present

## 2021-03-12 DIAGNOSIS — F0151 Vascular dementia with behavioral disturbance: Secondary | ICD-10-CM | POA: Diagnosis not present

## 2021-03-12 DIAGNOSIS — F01518 Vascular dementia, unspecified severity, with other behavioral disturbance: Secondary | ICD-10-CM

## 2021-03-12 DIAGNOSIS — R5381 Other malaise: Secondary | ICD-10-CM | POA: Diagnosis not present

## 2021-03-12 DIAGNOSIS — L03031 Cellulitis of right toe: Secondary | ICD-10-CM | POA: Diagnosis not present

## 2021-03-12 NOTE — Progress Notes (Signed)
[facility] Consult Note Telephone: [phone]  Fax: [phone]    Date of encounter: 03/12/21 PATIENT NAME: Bradley Nelson [place] [id number]   [phone] (home)  DOB: 1932-05-02 MRN: [id number] PRIMARY CARE PROVIDER:    Baxter Hire, MD,  [place] [id number] [phone]  RESPONSIBLE PARTY:    Contact Information     Name Relation Home Work [place] Daughter [phone]     Gregoire,Vicky Niece [phone]  [phone]   Evern Bio [phone]  [phone]      I met face to face with patient and family in in home. Palliative Care was asked to follow this patient by consultation request of  Baxter Hire, MD to address advance care planning and complex medical decision making. This is a follow up visit.                                   ASSESSMENT AND PLAN / RECOMMENDATIONS:   Advance Care Planning/Goals of Care: Goals include to maximize quality of life and symptom management. Our advance care planning conversation included a discussion about:    The value and importance of advance care planning  Experiences with loved ones who have been seriously ill or have died  Exploration of personal, cultural or spiritual beliefs that might influence medical decisions  Exploration of goals of care in the event of a sudden injury or illness  Identification and preparation of a healthcare agent  Review and updating or creation of an  advance directive document . Decision not to resuscitate or to de-escalate disease focused treatments due to poor prognosis. CODE STATUS: DNR  Symptom Management/Plan: 1. ACP; discussed and completed DNR/MOST form, placed in Vyn[facility]; revisited hospice and in agreement with hospice physicians to review eligibility. Hospice physicians in agreement to proceed with hospice order   2. Debility/generalized weakness  secondary to vascular with alzheimer's dementia with behaviors, discussed chronic disease progression with realistic expectations. Discussed not at present time is Bradley Nelson eligible for hospice services as not has declined to meet eligibility. Hospice services reviewed as well as PC, will continue to follow   3. Palliative care encounter; Palliative care encounter; Palliative medicine team will continue to support patient, patient's family, and medical team. Visit consisted of counseling and education dealing with the complex and emotionally intense issues of symptom management and palliative care in the setting of serious and potentially life-threatening illness  4. Cellulitis right great toe, discussed care; Rx sent in for keflex 564m bid x 7 days, no rf; #14  I spent 60 minutes providing this consultation. More than 50% of the time in this consultation was spent in counseling and care coordination.  PPS: 40%  HOSPICE ELIGIBILITY/DIAGNOSIS: yes per Hospice Physicians  Chief Complaint: Follow up visit for complex medical decision making  HISTORY OF PRESENT ILLNESS:  MWALLER MARCUSSENis a 87y.o. 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 I visited Bradley GPilar  in his home initially the caregiver was present and niece Casilda Carls joined the visit. Bradley Nelson was sleeping in his bed he awoke to verbal cues. Bradley Nelson does make eye contact and is able to answer simple questions. Bradley Nelson denied  symptoms of pain or shortness of breath at present time but then quickly return to sleeping. Bradley Nelson was cooperative with assessment. Noted right great toe blanching with some erythema and edema no noted drainage. Good pedal pulse. Emotional support provided. I met with Casilda Carls separate from Bradley Michelle. We talked about Hospice admission visit which they found at that time he was not meeting criteria for eligibility. We talked about overall changes including functional changes, now total care. We talked about nine months ago Bradley Cockerell was able to ambulate with his Gilford Rile get up out of a chair without assistance. Three months ago Bradley Olander required some assistance getting out of a chair but was still able to take steps. Today's visit Bradley Nelson requires to be lifted out of a chair or bed. It does take some time for this to happen as he is slow to move. Bradley Nelson does continue to have incontinent episodes. Bradley Nelson does require to be bathed and dressed. Bradley Nelson is able to feed himself though appetite varies depending on meals. Bradley Nelson does like to eat breakfast and breakfast foods. Casilda Carls endorses they often serve breakfast foods at dinner so he will eat better period [place] endorses it does take Bradley Nelson over 30 minutes to eat as he is able to feed himself. Bradley Nelson appears to have lost a few more pounds though Jocelyn Lamer endorses she didn't think so. Casilda Carls talked about Bradley Nelson being more confused at times worsening memory loss. Casilda Carls endorses he is talking about dying and why it couldn' be him. Casilda Carls endorses Bradley Nelson talking more about his parents and living in their house as it was in a present time. Casilda Carls talked about some days he has more energy than other days. Casilda Carls endorses lately though Bradley Nelson has been sleeping a large port of day and night. Casilda Carls endorses she asked caregivers that stay 24 hours a day to ensure that he gets up out of bed. We talked about medical goals  of care period we talked about most form which [place] and daughter completed. Reviewed DNR, comfort care [place] DNR form completed and will place most form and DNR in [place]. We revisited Hospice. We talked about criteria, eligibility and with ongoing changes and decline can revisit with Hospice physicians to see about eligibility. Jocelyn Lamer and agreement. Casilda Carls I talked about Hospice a visit. We talked about servicewith that it would be beneficial as Bradley Nelson continues to decline. Discuss with Casilda Carls will review with Hospice physicians and then obtain order for reschedule a visit. [place] in agreement. Therapeutic listening and emotional support provided. We talked about overall decline chronic disease progression of vascular dementia with expectations. Questions answered. Casilda Carls endorses she was thankful for palliative care visit today.   History obtained from review of EMR, discussion with primary team, and interview with Niece Casilda Carls and Bradley. Nelson.  I reviewed available labs, medications, imaging, studies and related documents from the EMR.  Records reviewed and summarized above.   ROS Full 14 system review of systems performed and negative with exception of: as per HPI.   Physical Exam: Constitutional: NAD General: frail appearing, thin, male EYES: lids intact, ENMT: oral mucous membranes moist CV: S1S2, RRR, no LE edema Pulmonary: LCTA, no  increased work of breathing, no cough, room air Abdomen: soft and non tender MSK: w/c  Skin: warm and dry; right great toe with erythema, no drainage, slight warmth Neuro:  + generalized weakness,  + cognitive impairment Psych: flat affect, sleepy, oriented to self  Questions and concerns were addressed. The family was encouraged to call with questions and/or concerns.  Provided general support and encouragement, no other unmet needs identified   Thank you for the opportunity to participate in the care of Bradley Nelson.  The palliative care team  will continue to follow. Please call our office at [phone] if we can be of additional assistance.   This chart was dictated using voice recognition software.  Despite best efforts to proofread,  errors can occur which can change the documentation meaning.   Tal Kempker Z Rether Rison, NP   COVID-19 PATIENT SCREENING TOOL Asked and negative response unless otherwise noted:   Have you had symptoms of covid, tested positive or been in contact with someone with symptoms/positive test in the past 5-10 days?  NO

## 2021-03-23 DIAGNOSIS — I25709 Atherosclerosis of coronary artery bypass graft(s), unspecified, with unspecified angina pectoris: Secondary | ICD-10-CM | POA: Diagnosis not present

## 2021-03-23 DIAGNOSIS — F0151 Vascular dementia with behavioral disturbance: Secondary | ICD-10-CM | POA: Diagnosis not present

## 2021-03-23 DIAGNOSIS — E785 Hyperlipidemia, unspecified: Secondary | ICD-10-CM | POA: Diagnosis not present

## 2021-03-23 DIAGNOSIS — I509 Heart failure, unspecified: Secondary | ICD-10-CM | POA: Diagnosis not present

## 2021-03-23 DIAGNOSIS — I110 Hypertensive heart disease with heart failure: Secondary | ICD-10-CM | POA: Diagnosis not present

## 2021-03-23 DIAGNOSIS — F0281 Dementia in other diseases classified elsewhere with behavioral disturbance: Secondary | ICD-10-CM | POA: Diagnosis not present

## 2021-03-23 DIAGNOSIS — G309 Alzheimer's disease, unspecified: Secondary | ICD-10-CM | POA: Diagnosis not present

## 2021-03-26 DIAGNOSIS — G309 Alzheimer's disease, unspecified: Secondary | ICD-10-CM | POA: Diagnosis not present

## 2021-03-26 DIAGNOSIS — F0280 Dementia in other diseases classified elsewhere without behavioral disturbance: Secondary | ICD-10-CM | POA: Diagnosis not present

## 2021-03-26 DIAGNOSIS — F0150 Vascular dementia without behavioral disturbance: Secondary | ICD-10-CM | POA: Diagnosis not present

## 2021-03-29 DIAGNOSIS — H6123 Impacted cerumen, bilateral: Secondary | ICD-10-CM | POA: Diagnosis not present

## 2021-03-29 DIAGNOSIS — H903 Sensorineural hearing loss, bilateral: Secondary | ICD-10-CM | POA: Diagnosis not present

## 2021-05-08 DEATH — deceased

## 2021-05-24 IMAGING — CT CT HEAD W/O CM
3 series · 15 of 47 positions shown, 18 images · non-contrast
Comparison: CT brain 01/16/2016

CLINICAL DATA: Increased shortness of breath, history of COVID
exposure

EXAM:
CT HEAD WITHOUT CONTRAST
TECHNIQUE: Contiguous axial images were obtained from the base of the skull
through the vertex without intravenous contrast.

[Series 3: head wo · axial · 0.44mm/px · z∈[+325,+450]mm · 9 of 30 slices shown, 12 images]
[im 3/30  brain]
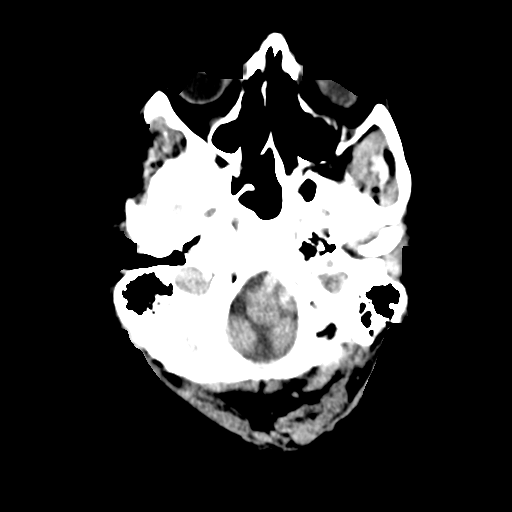
[im 3/30  bone]
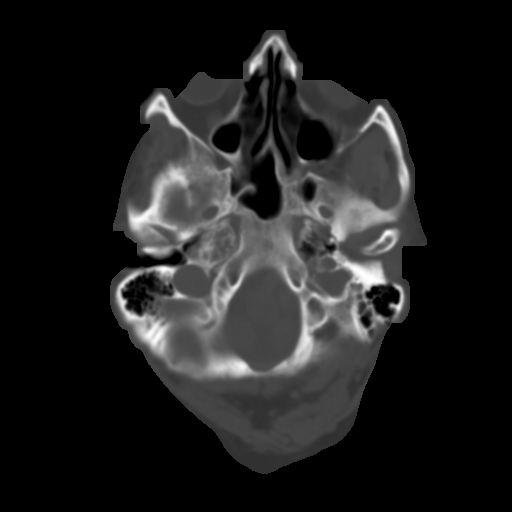
[im 6/30  brain]
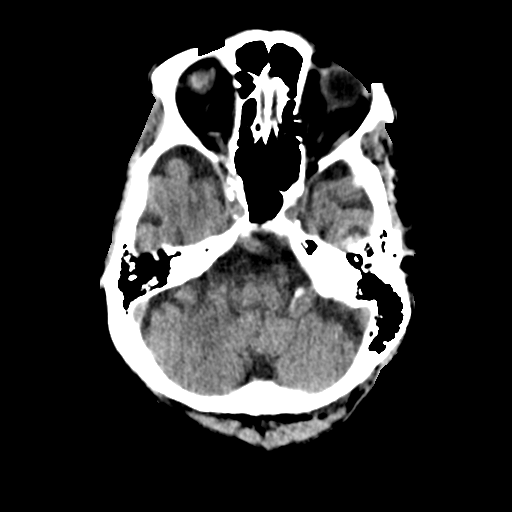
[im 9/30  brain]
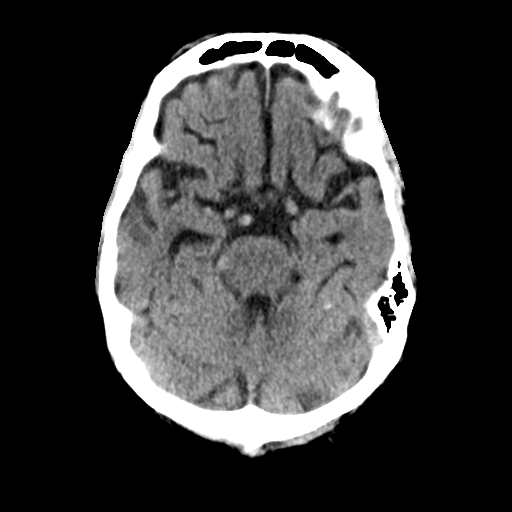
[im 12/30  brain]
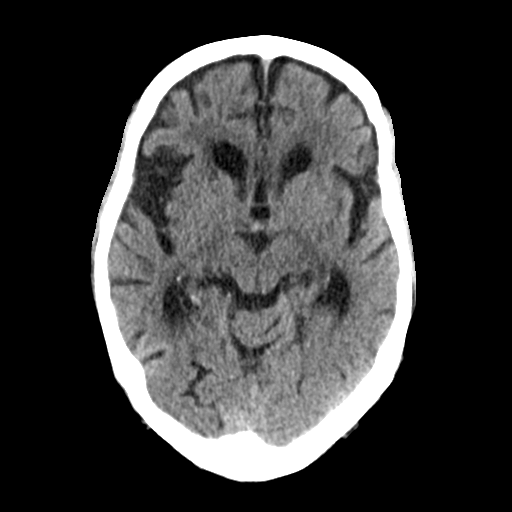
[im 16/30  brain]
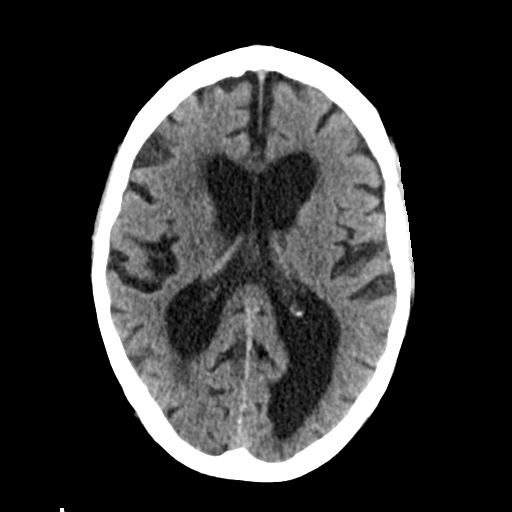
[im 16/30  bone]
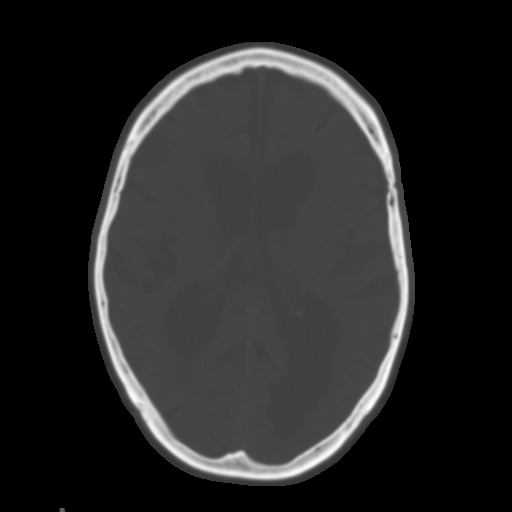
[im 19/30  brain]
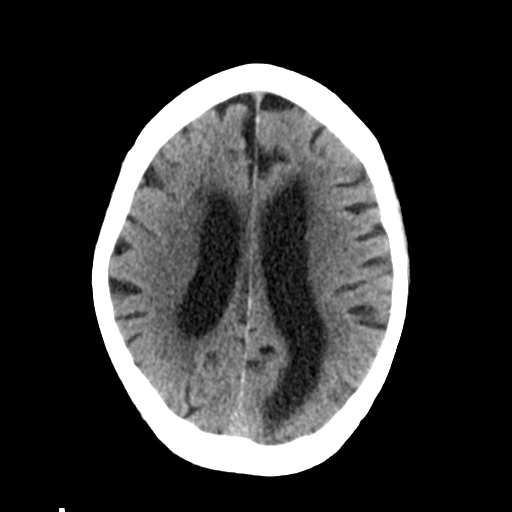
[im 22/30  brain]
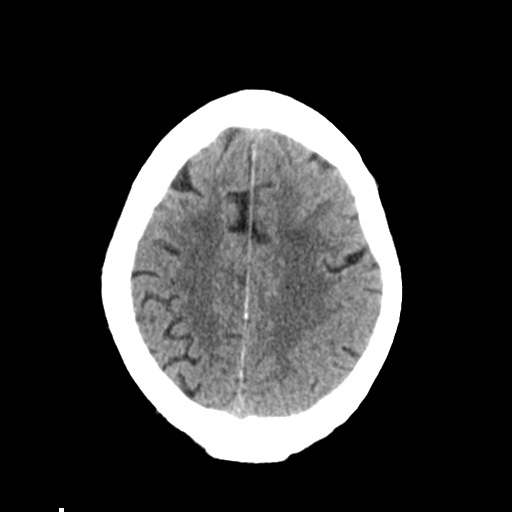
[im 25/30  brain]
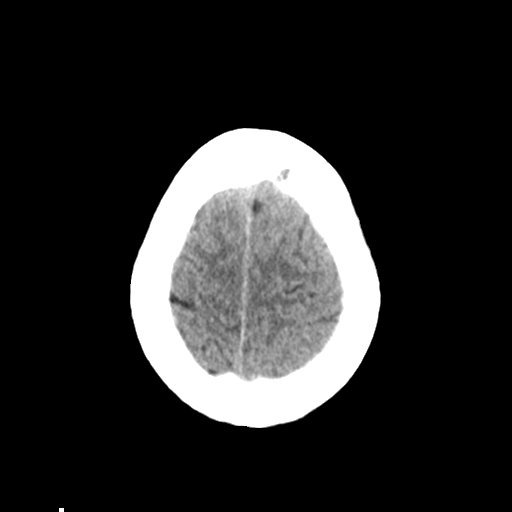
[im 28/30  brain]
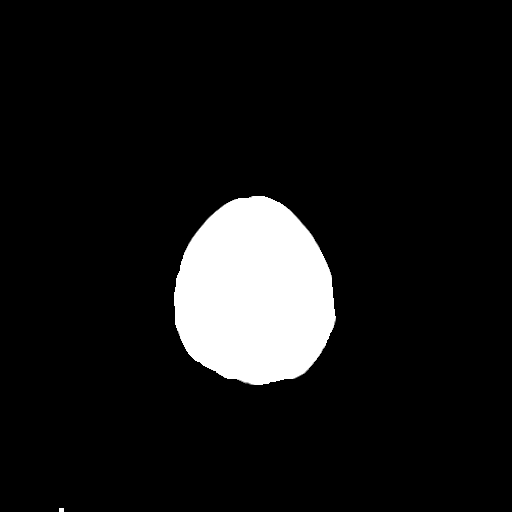
[im 28/30  bone]
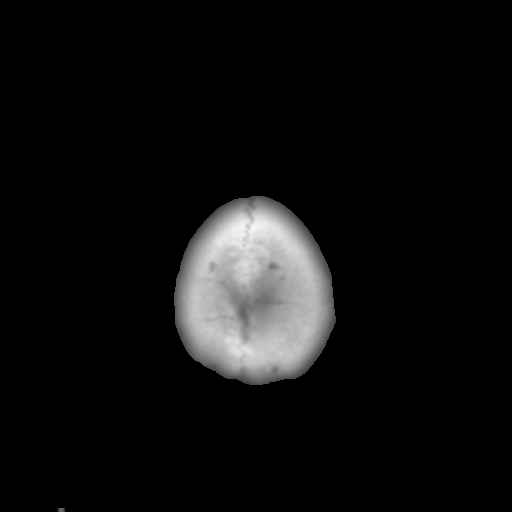

[Series 4: coronal soft tissue · coronal · 0.30mm/px · 3 of 65 slices shown]
[im 22/65  brain]
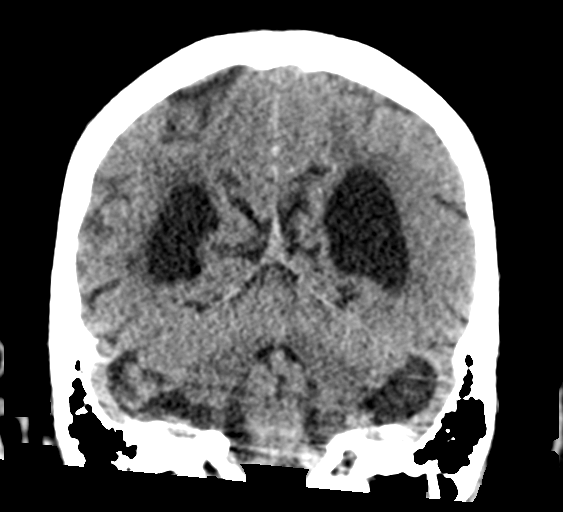
[im 29/65  brain]
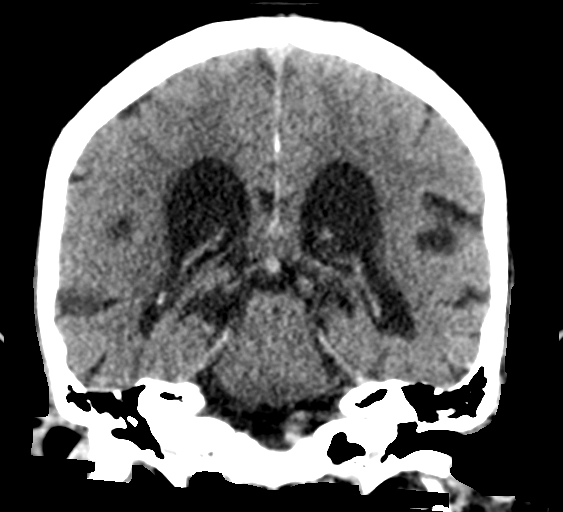
[im 36/65  brain]
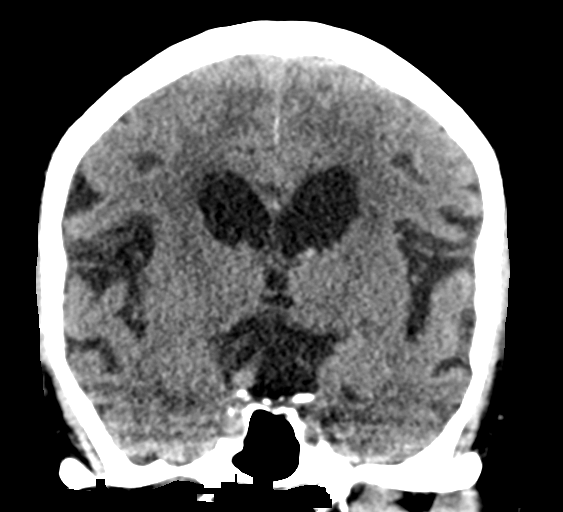

[Series 5: sagittal soft tissue · sagittal · 0.30mm/px · 3 of 47 slices shown]
[im 16/47  brain]
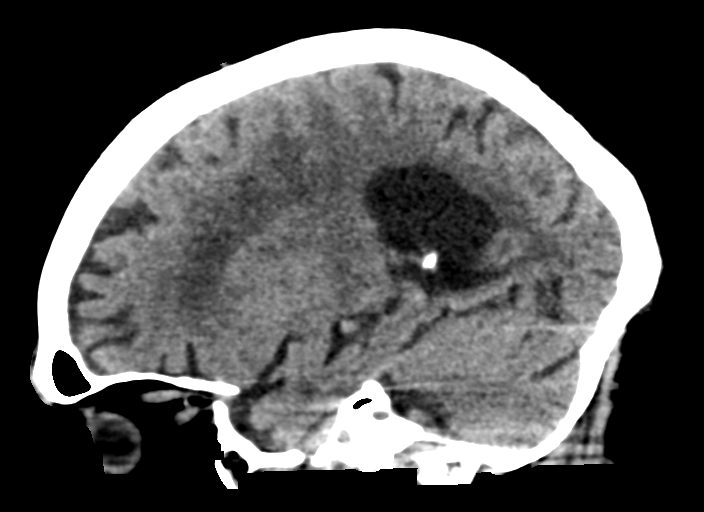
[im 24/47  brain]
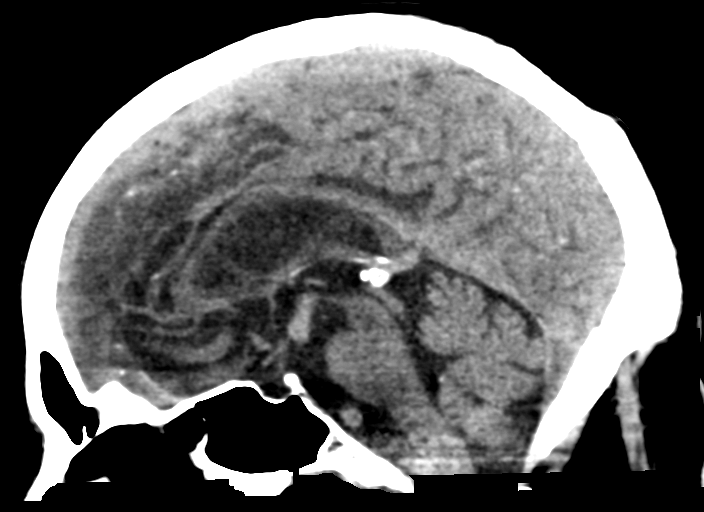
[im 31/47  brain]
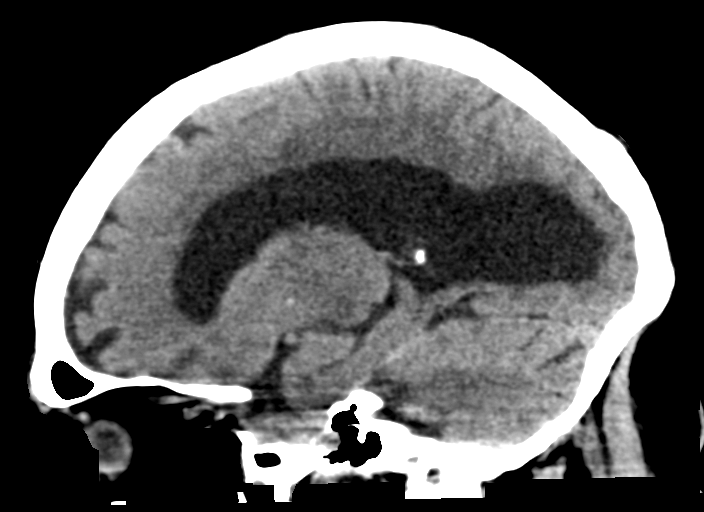

[15 of 47 positions shown; findings below may reference images not displayed]

FINDINGS: Brain: No acute territorial infarction, hemorrhage or intracranial
mass. Moderate atrophy. Mild hypodensity in the white matter
consistent with chronic small vessel ischemic change. Enlarged
ventricles but stable.

Vascular: No hyperdense vessels.  Carotid vascular calcification

Skull: Normal. Negative for fracture or focal lesion.

Sinuses/Orbits: Postsurgical changes of the maxillary sinuses.

Other: None
IMPRESSION: 1. No CT evidence for acute intracranial abnormality.
2. Atrophy and small vessel ischemic changes of the white matter
